# Patient Record
Sex: Female | Born: 1965 | Race: White | Hispanic: No | Marital: Married | Smoking: Never smoker
Health system: Southern US, Community
[De-identification: ages and names within clinical notes are randomized; demographics above are authoritative.]

## PROBLEM LIST (undated history)

## (undated) DIAGNOSIS — J45909 Unspecified asthma, uncomplicated: Secondary | ICD-10-CM

## (undated) DIAGNOSIS — I420 Dilated cardiomyopathy: Secondary | ICD-10-CM

## (undated) DIAGNOSIS — Z95810 Presence of automatic (implantable) cardiac defibrillator: Secondary | ICD-10-CM

## (undated) DIAGNOSIS — E213 Hyperparathyroidism, unspecified: Secondary | ICD-10-CM

## (undated) DIAGNOSIS — F419 Anxiety disorder, unspecified: Secondary | ICD-10-CM

## (undated) DIAGNOSIS — Z7982 Long term (current) use of aspirin: Secondary | ICD-10-CM

## (undated) DIAGNOSIS — Z950 Presence of cardiac pacemaker: Secondary | ICD-10-CM

## (undated) DIAGNOSIS — E559 Vitamin D deficiency, unspecified: Secondary | ICD-10-CM

## (undated) DIAGNOSIS — Z87442 Personal history of urinary calculi: Secondary | ICD-10-CM

## (undated) DIAGNOSIS — B3322 Viral myocarditis: Secondary | ICD-10-CM

## (undated) DIAGNOSIS — I509 Heart failure, unspecified: Secondary | ICD-10-CM

## (undated) DIAGNOSIS — K5790 Diverticulosis of intestine, part unspecified, without perforation or abscess without bleeding: Secondary | ICD-10-CM

## (undated) DIAGNOSIS — I447 Left bundle-branch block, unspecified: Secondary | ICD-10-CM

## (undated) DIAGNOSIS — M170 Bilateral primary osteoarthritis of knee: Secondary | ICD-10-CM

## (undated) DIAGNOSIS — Z9889 Other specified postprocedural states: Secondary | ICD-10-CM

## (undated) DIAGNOSIS — F32A Depression, unspecified: Secondary | ICD-10-CM

## (undated) DIAGNOSIS — R002 Palpitations: Secondary | ICD-10-CM

## (undated) DIAGNOSIS — I10 Essential (primary) hypertension: Secondary | ICD-10-CM

## (undated) DIAGNOSIS — I429 Cardiomyopathy, unspecified: Secondary | ICD-10-CM

## (undated) DIAGNOSIS — I209 Angina pectoris, unspecified: Secondary | ICD-10-CM

## (undated) DIAGNOSIS — R7303 Prediabetes: Secondary | ICD-10-CM

## (undated) DIAGNOSIS — R112 Nausea with vomiting, unspecified: Secondary | ICD-10-CM

## (undated) DIAGNOSIS — E785 Hyperlipidemia, unspecified: Secondary | ICD-10-CM

## (undated) DIAGNOSIS — E7800 Pure hypercholesterolemia, unspecified: Secondary | ICD-10-CM

## (undated) DIAGNOSIS — M1990 Unspecified osteoarthritis, unspecified site: Secondary | ICD-10-CM

## (undated) HISTORY — DX: Anxiety disorder, unspecified: F41.9

## (undated) HISTORY — PX: WISDOM TOOTH EXTRACTION: SHX21

## (undated) HISTORY — DX: Hyperlipidemia, unspecified: E78.5

## (undated) HISTORY — DX: Left bundle-branch block, unspecified: I44.7

---

## 2006-07-16 ENCOUNTER — Ambulatory Visit: Payer: Self-pay | Admitting: Urology

## 2006-07-21 ENCOUNTER — Ambulatory Visit: Payer: Self-pay | Admitting: Urology

## 2007-06-10 DIAGNOSIS — J209 Acute bronchitis, unspecified: Secondary | ICD-10-CM | POA: Insufficient documentation

## 2008-02-19 ENCOUNTER — Ambulatory Visit: Payer: Self-pay | Admitting: Family Medicine

## 2008-09-26 DIAGNOSIS — J0100 Acute maxillary sinusitis, unspecified: Secondary | ICD-10-CM | POA: Insufficient documentation

## 2010-03-18 DIAGNOSIS — I447 Left bundle-branch block, unspecified: Secondary | ICD-10-CM

## 2010-03-18 HISTORY — DX: Left bundle-branch block, unspecified: I44.7

## 2010-11-20 ENCOUNTER — Ambulatory Visit (INDEPENDENT_AMBULATORY_CARE_PROVIDER_SITE_OTHER): Payer: BC Managed Care – PPO | Admitting: Cardiology

## 2010-11-20 ENCOUNTER — Encounter: Payer: Self-pay | Admitting: Cardiology

## 2010-11-20 DIAGNOSIS — I447 Left bundle-branch block, unspecified: Secondary | ICD-10-CM

## 2010-11-20 DIAGNOSIS — E785 Hyperlipidemia, unspecified: Secondary | ICD-10-CM

## 2010-11-20 DIAGNOSIS — R079 Chest pain, unspecified: Secondary | ICD-10-CM | POA: Insufficient documentation

## 2010-11-20 NOTE — Assessment & Plan Note (Signed)
Given her history of hyperlipidemia and family history of early coronary disease she needs stress test evaluation with a Lexiscan Myoview study. We'll also schedule her for an echocardiogram. If her stress test is abnormal or equivocal she will need a cardiac catheterization

## 2010-11-20 NOTE — Progress Notes (Signed)
   Erin Stuart Date of Birth: 1965-06-24   History of Present Illness: Erin Stuart is a very pleasant 45 year old white female who is referred for evaluation of chest pain. She reports 2 episodes of significant chest pain over the past month. She describes this as mid substernal with a lot of pressure. She equates it with an elephant sitting on her chest. The first time she had this she was just getting dressed. Her pain lasted for 30 minutes and then abated. She's had one other episode since then. She does complain that she is more short of breath with activity even just walking across her yard. She does have a history of hyperlipidemia and family history of early coronary disease. She states that she is 50 pounds overweight compared to 10 years ago. Her only prior cardiac evaluation included an echocardiogram 5 or 6 years ago for a murmur. His is apparently unremarkable.  No current outpatient prescriptions on file prior to visit.    No Known Allergies  Past Medical History  Diagnosis Date  . Hyperlipidemia   . Anxiety     History reviewed. No pertinent past surgical history.  History  Smoking status  . Never Smoker   Smokeless tobacco  . Not on file    History  Alcohol Use No    History reviewed. No pertinent family history.  Review of Systems: The review of systems is positive for her father having factor V Leiden deficiency. She has been tested for this and was negative.  All other systems were reviewed and are negative.  Physical Exam: BP 158/102  Ht 5\' 4"  (1.626 m)  Wt 196 lb 3.2 oz (88.996 kg)  BMI 33.68 kg/m2 She is an overweight white female in no acute distress.The patient is alert and oriented x 3.  The mood and affect are normal.  The skin is warm and dry.  Color is normal.  The HEENT exam reveals that the sclera are nonicteric.  The mucous membranes are moist.  The carotids are 2+ without bruits.  There is no thyromegaly.  There is no JVD.  The lungs are clear.   The chest wall is non tender.  The heart exam reveals a regular rate with a normal S1 and S2.  There are no murmurs, gallops, or rubs.  The PMI is not displaced.   Abdominal exam reveals good bowel sounds. She is obese. There is no guarding or rebound.  There is no hepatosplenomegaly or tenderness.  There are no masses.  Exam of the legs reveal no clubbing, cyanosis, or edema.  The legs are without rashes.  The distal pulses are intact.  Cranial nerves II - XII are intact.  Motor and sensory functions are intact.  The gait is normal. LABORATORY DATA: ECG demonstrates normal sinus rhythm with a left axis deviation and left bundle branch block.  Assessment / Plan:

## 2010-11-20 NOTE — Assessment & Plan Note (Signed)
No remote EKGs for comparison. Cardiac evaluation as noted.

## 2010-11-20 NOTE — Patient Instructions (Signed)
You have a left bundle branch block.  We will schedule you for a nuclear stress test and an echocardiogram.  We will get a copy of your prior blood work.  Lexiscan Stress Test Your caregiver has ordered a Lexiscan Stress Test with nuclear imaging. The purpose of this test is to evaluate the blood supply to your heart muscle. This procedure is referred to as a "Non-Invasive Stress Test." This is because other than having an IV started in your vein, nothing is inserted or "invades" your body. Cardiac stress tests are done to find areas of poor blood flow to the heart by determining the extent of coronary artery disease (CAD). Some patients exercise on a treadmill, which naturally increases the blood flow to your heart. However, almost half of the patients undergoing a treadmill stress test each year are unable to exercise adequately. This is due to various medical conditions. For these patients, a pharmacologic/chemical stress agent called Eugenie Birks is used on patients without a history of asthma. This medicine will mimic walking on a treadmill by temporarily increasing your coronary blood flow. The side effects of this medicine include:  Headache.   Dizziness.   Shortness of breath.   Flushing.   Chest pain/pressure.   Feeling sick to your stomach (nauseous).   Increased heart rate.   Abdominal discomfort.   Low blood pressure.  BEFORE THE PROCEDURE  Avoid all forms of caffeine 24 hours before your test. This includes coffee, tea (even decaffeinated brands), caffeinated sodas, chocolate, cocoa and certain pain medications that contain caffeine.   Do not eat anything 6 hours before the test. In hospitalized patients, this means nothing by mouth after midnight.   Patients with diabetes that are not hospitalized (outpatients) should talk to their caregiver about how much, if any, insulin or oral diabetic medications they should take the day of the test. You should also talk about the type  of light snack that you should eat the morning of the test.   Patients with diabetes that are hospitalized (inpatients) will follow the cardiologist's written instructions that will be given to you.   Outpatients should bring a snack. Use the hospital vending machines so that you may eat right after the stress phase.   Wear comfortable shoes. There is at least an hour wait before the stress phase of nuclear scanning is done. The total procedure time is approximately 3 hours.   The following medications should be stopped 24 hours before your stress test: Beta-blockers and nitroglycerine (patches or paste should be removed 4 hours before the test).   Women, tell your caregiver if there is any possibility that you may be pregnant or if you are currently breastfeeding.  PROCEDURE There are two separate nuclear images taken using a nuclear camera. These images require a small dose of a radiotracer called an isotope. Most diagnostic nuclear medicine procedures result in low radiation exposure. Allergic reactions to the isotope may include slight redness going up the arm and pain during the injection. However, these reactions are extremely rare and usually mild. Eugenie Birks is given very rapidly over 10-15 seconds in the vein (intravenously) followed by a nuclear isotope. The medication causes the arteries in your heart to widen (dilate), and the nuclear isotope lights up those arteries so that the nuclear images are clear. Together, this shows whether the coronary blood flow is normal or abnormal. During this stress phase, you will be connected to an EKG machine. Your blood pressure and oxygen levels will be monitored  by the cardiologist and cardiac nurse. This part usually lasts 5-10 minutes. For inpatients, the procedure is done in the patient's room. For outpatients, the procedure is done in the stress lab.  The "Resting Phase" is done before the Lexiscan injection and shows how your heart functions at  rest. The "Stress Phase" is done about 1 hour after the Lexiscan injection and determines how your heart functions under stress.  LEXISCAN STRESS TEST IS USEFUL FOR DETERMINING:  The adequacy of blood flow to your heart during increased levels of activity in order to clear you for discharge home.   The extent of coronary artery blockage caused by CAD.   Your prognosis if you have suffered a heart attack.   The effectiveness of cardiac procedures done, such as an angioplasty, which can increase the circulation in your coronary arteries.   Cause(s) of chest pain/pressure.  RESULTS Not all test results are available during your visit. If your test results are not back during the visit, make an appointment with your caregiver to find out the results. Do not assume everything is normal if you have not heard from your caregiver or the medical facility. It is important for you to follow up on all of your test results. Document Released: 07/21/2008  [facility] Patient Information 2011 [place], [place].

## 2010-11-20 NOTE — Assessment & Plan Note (Signed)
We will obtain copies of her most recent lab work. Given her family history of early coronary disease we may need to be more aggressive about treating her cholesterol. If her cardiac evaluation is unremarkable we will recommend regular aerobic exercise program and weight loss.

## 2010-11-21 ENCOUNTER — Encounter: Payer: Self-pay | Admitting: Cardiology

## 2010-11-22 ENCOUNTER — Encounter: Payer: Self-pay | Admitting: *Deleted

## 2010-11-29 ENCOUNTER — Ambulatory Visit (HOSPITAL_COMMUNITY): Payer: BC Managed Care – PPO | Attending: Cardiology | Admitting: Radiology

## 2010-11-29 ENCOUNTER — Ambulatory Visit (HOSPITAL_COMMUNITY): Payer: BC Managed Care – PPO | Attending: Cardiology

## 2010-11-29 DIAGNOSIS — E785 Hyperlipidemia, unspecified: Secondary | ICD-10-CM | POA: Insufficient documentation

## 2010-11-29 DIAGNOSIS — I447 Left bundle-branch block, unspecified: Secondary | ICD-10-CM

## 2010-11-29 DIAGNOSIS — R0989 Other specified symptoms and signs involving the circulatory and respiratory systems: Secondary | ICD-10-CM | POA: Insufficient documentation

## 2010-11-29 DIAGNOSIS — R0789 Other chest pain: Secondary | ICD-10-CM

## 2010-11-29 DIAGNOSIS — R0609 Other forms of dyspnea: Secondary | ICD-10-CM | POA: Insufficient documentation

## 2010-11-29 DIAGNOSIS — I491 Atrial premature depolarization: Secondary | ICD-10-CM

## 2010-11-29 DIAGNOSIS — R079 Chest pain, unspecified: Secondary | ICD-10-CM | POA: Insufficient documentation

## 2010-11-29 DIAGNOSIS — E669 Obesity, unspecified: Secondary | ICD-10-CM | POA: Insufficient documentation

## 2010-11-29 DIAGNOSIS — R9389 Abnormal findings on diagnostic imaging of other specified body structures: Secondary | ICD-10-CM | POA: Insufficient documentation

## 2010-11-29 DIAGNOSIS — I4949 Other premature depolarization: Secondary | ICD-10-CM

## 2010-11-29 DIAGNOSIS — R072 Precordial pain: Secondary | ICD-10-CM

## 2010-11-29 MED ORDER — TECHNETIUM TC 99M TETROFOSMIN IV KIT
33.0000 | PACK | Freq: Once | INTRAVENOUS | Status: AC | PRN
  Administered 2010-11-29: 33 via INTRAVENOUS

## 2010-11-29 MED ORDER — TECHNETIUM TC 99M TETROFOSMIN IV KIT
11.0000 | PACK | Freq: Once | INTRAVENOUS | Status: AC | PRN
  Administered 2010-11-29: 11 via INTRAVENOUS

## 2010-11-29 MED ORDER — ADENOSINE (DIAGNOSTIC) 3 MG/ML IV SOLN
0.5600 mg/kg | Freq: Once | INTRAVENOUS | Status: AC
  Administered 2010-11-29: 50.1 mg via INTRAVENOUS

## 2010-11-29 NOTE — Progress Notes (Signed)
[facility] SITE 3 NUCLEAR MED [address] [place] [address] [phone]  Cardiology Nuclear Med Study  Erin Stuart is a 45 y.o. female [id number] 10/01/1965   Nuclear Med Background Indication for Stress Test:  Evaluation for Ischemia History:  No previous documented CAD and 5/6 yrs ago Echo: NL per pt Cardiac Risk Factors: Family History - CAD, LBBB and Lipids  Symptoms:  Chest Pain, Chest Pressure, DOE and Palpitations   Nuclear Pre-Procedure Caffeine/Decaff Intake:  None NPO After: 630 pm   Lungs:  clear IV 0.9% NS with Angio Cath:  20g  IV Site: R Hand  IV Started by:  Bonnita Levan, RN  Chest Size (in):  36 Cup Size: C  Height: 5\' 4"  (1.626 m)  Weight:  197 lb (89.359 kg)  BMI:  Body mass index is 33.82 kg/(m^2). Tech Comments:  N/A    Nuclear Med Study 1 or 2 day study: 1 day  Stress Test Type:  Adenosine  Reading MD: Cassell Clement, MD  Order Authorizing Provider:  P,.Jordan  Resting Radionuclide: Technetium 28m Tetrofosmin  Resting Radionuclide Dose: 11.0 mCi   Stress Radionuclide:  Technetium 54m Tetrofosmin  Stress Radionuclide Dose: 33.0 mCi           Stress Protocol Rest HR: 76 Stress HR: 100  Rest BP: 138/97 Stress BP: 158/88  Exercise Time (min): n/a METS: n/a   Predicted Max HR: 175 bpm % Max HR: 57.14 bpm Rate Pressure Product: 08657   Dose of Adenosine (mg):  50.1 Dose of Lexiscan: n/a mg  Dose of Atropine (mg): n/a Dose of Dobutamine: n/a mcg/kg/min (at max HR)  Stress Test Technologist: Milana Na, EMT-P  Nuclear Technologist:  Domenic Polite, CNMT     Rest Procedure:  Myocardial perfusion imaging was performed at rest 45 minutes following the intravenous administration of Technetium 81m Tetrofosmin. Rest ECG: NSR  Stress Procedure:  The patient received IV adenosine at 140 mcg/kg/min for 4 minutes.  There were no significant changes and rare pvc/pac with infusion.  Technetium 73m Tetrofosmin was injected  at the 2 minute mark and quantitative spect images were obtained after a 45 minute delay. Stress ECG: Uninteretable due to baseline LBBB  QPS Raw Data Images:  There is interference from nuclear activity from structures below the diaphragm. This does not affect the ability to read the study. Stress Images:  Normal homogeneous uptake in all areas of the myocardium. Rest Images:  Normal homogeneous uptake in all areas of the myocardium. Subtraction (SDS):  No evidence of ischemia. Transient Ischemic Dilatation (Normal <1.22):  0.88 Lung/Heart Ratio (Normal <0.45):  0.40  Quantitative Gated Spect Images QGS EDV:  107 ml QGS ESV:  47 ml QGS cine images:  NL LV Function; NL Wall Motion QGS EF: 56%  Impression Exercise Capacity:  Adenosine study with no exercise. BP Response:  Normal blood pressure response. Clinical Symptoms:  Chest tightness ECG Impression:  Baseline:  LBBB.  EKG uninterpretable due to LBBB at rest and stress. Comparison with Prior Nuclear Study: No previous nuclear study performed  Overall Impression:  Normal stress nuclear study.  No ischemia.  Normal LV systolic function.    Cassell Clement

## 2010-11-30 ENCOUNTER — Telehealth: Payer: Self-pay | Admitting: *Deleted

## 2010-11-30 NOTE — Telephone Encounter (Signed)
Notified of stress test and echo results. Will send to Dr. Katrinka Blazing. Advised to get prilosec otc. Will see in 1 mo

## 2010-11-30 NOTE — Telephone Encounter (Signed)
Message copied by Lorayne Bender on Fri Nov 30, 2010  3:16 PM ------      Message from: [place], PETER M      Created: Fri Nov 30, 2010 12:51 PM       Nuclear stress test is normal. We need to get prior lab work to review lipids. Recommend prilosec for chest pain. Needs to lose weight and begin a regular walking program. I would like to see her back in 1 month.      Theron Arista [place]

## 2010-12-04 ENCOUNTER — Telehealth: Payer: Self-pay | Admitting: Cardiology

## 2010-12-04 NOTE — Telephone Encounter (Signed)
Pt returning call to [place].  Please call her back at [phone] ext. [phone]

## 2010-12-04 NOTE — Telephone Encounter (Signed)
Had several questions about her test results. Advised to increase walking and to monitor diet more closely. Made her an app for 1 mo for follow up. Advised to try Prilosec to see if that helps with chest discomfort.

## 2010-12-05 ENCOUNTER — Telehealth: Payer: Self-pay | Admitting: *Deleted

## 2010-12-05 MED ORDER — ATORVASTATIN CALCIUM 10 MG PO TABS: 10.0000 mg | ORAL_TABLET | Freq: Every day | ORAL | Status: DC

## 2010-12-05 NOTE — Telephone Encounter (Signed)
Received labs from [facility] in [place]. Dr. [place] wants to start on Lipitor 10 mg daily. Advised of side effects. Will send Erin Stuart order for repeat labs in 3 mo. LP,hfp,bmet. Sent lab request to att: Herbert Seta at fax [phone].

## 2010-12-24 ENCOUNTER — Institutional Professional Consult (permissible substitution): Payer: Self-pay | Admitting: Cardiology

## 2011-01-02 ENCOUNTER — Ambulatory Visit: Payer: BC Managed Care – PPO | Admitting: Cardiology

## 2011-01-22 ENCOUNTER — Ambulatory Visit: Payer: BC Managed Care – PPO | Admitting: Cardiology

## 2014-08-22 ENCOUNTER — Telehealth: Payer: Self-pay | Admitting: Obstetrics and Gynecology

## 2014-08-22 NOTE — Telephone Encounter (Signed)
CALLED [facility] [place], ALL MEDICATION IS FILLED AND READY FOR PT TO PICK UP

## 2014-08-23 ENCOUNTER — Telehealth: Payer: Self-pay | Admitting: Obstetrics and Gynecology

## 2014-08-23 NOTE — Telephone Encounter (Signed)
PT WAS CALLED LAST NIGHT ABOUT HER RX'S.Erin Stuart W[facility]ED INTO [facility] BUT SHE USES [facility] IN [place]..THEY NEED TO BE CALLED IN THERE.

## 2014-08-23 NOTE — Telephone Encounter (Signed)
done

## 2014-09-27 ENCOUNTER — Telehealth: Payer: Self-pay | Admitting: *Deleted

## 2014-09-27 ENCOUNTER — Ambulatory Visit (INDEPENDENT_AMBULATORY_CARE_PROVIDER_SITE_OTHER): Payer: No Typology Code available for payment source | Admitting: Obstetrics and Gynecology

## 2014-09-27 ENCOUNTER — Other Ambulatory Visit: Payer: Self-pay | Admitting: Obstetrics and Gynecology

## 2014-09-27 VITALS — BP 148/82 | HR 81 | Ht 64.0 in | Wt 190.8 lb

## 2014-09-27 DIAGNOSIS — E669 Obesity, unspecified: Secondary | ICD-10-CM | POA: Diagnosis not present

## 2014-09-27 MED ORDER — CYANOCOBALAMIN 1000 MCG/ML IJ SOLN
1000.0000 ug | Freq: Once | INTRAMUSCULAR | Status: AC
  Administered 2014-09-27: 1000 ug via INTRAMUSCULAR

## 2014-09-27 MED ORDER — BUSPIRONE HCL 7.5 MG PO TABS: 7.5000 mg | ORAL_TABLET | Freq: Two times a day (BID) | ORAL | Status: DC

## 2014-09-27 NOTE — Telephone Encounter (Signed)
Pt would like refill on her buspar 7.5mg  bid, states her old Dr rx for her and she has transferred care to [place]

## 2014-09-27 NOTE — Progress Notes (Cosign Needed)
Pt is here for wt, bp check, b-12 inj Is tolerating medication well, has been on vacation, didn't do well with her diet

## 2014-09-27 NOTE — Telephone Encounter (Signed)
Notified pt. 

## 2014-09-27 NOTE — Telephone Encounter (Signed)
Please let her know it was done

## 2014-11-08 ENCOUNTER — Ambulatory Visit (INDEPENDENT_AMBULATORY_CARE_PROVIDER_SITE_OTHER): Payer: No Typology Code available for payment source | Admitting: Obstetrics and Gynecology

## 2014-11-08 VITALS — BP 124/84 | HR 80 | Ht 64.0 in | Wt 182.3 lb

## 2014-11-08 DIAGNOSIS — E663 Overweight: Secondary | ICD-10-CM

## 2014-11-08 MED ORDER — CYANOCOBALAMIN 1000 MCG/ML IJ SOLN
1000.0000 ug | Freq: Once | INTRAMUSCULAR | Status: AC
  Administered 2014-11-08: 1000 ug via INTRAMUSCULAR

## 2014-11-08 NOTE — Progress Notes (Cosign Needed)
Patient ID: Erin Stuart, female   DOB: 09-17-1965, 49 y.o.   MRN: [id number] Pt presents for weight check, B/P ck and B-12 injection.  Weight loss of 8 lbs since last visit.  Pt insisted she had a multidose vial of B-12 here and when I looked and asked the CMA who keeps it, stated if it is not there she does not have any.  I got one from stock and pt okayed for me to give her one.  Pt states "is that B-12?, I never seen red B-12 before" and I said that B-12 is red in color, at least all that I had ever seen was.  Appt to be scheduled for 1 month f/u with [facility]NB for weight management.  Pharmacy was called by CMA and pt got only a 57ml vial, this was after the pt left.  Pt became anxious and shaking her head and states "this is a very unorganized visit" and I asked her what made her say that. I realize she usually sees another nurse but other than the B-12 medication issue, what was different?  Pt states it is just different.  At check out I heard pt telling staff that we ought to have someone who knew what they were doing. Unsure of what exactly was meant by this and no voiced complaint from pt was the B-12 medication.

## 2014-12-06 ENCOUNTER — Ambulatory Visit (INDEPENDENT_AMBULATORY_CARE_PROVIDER_SITE_OTHER): Payer: No Typology Code available for payment source | Admitting: Obstetrics and Gynecology

## 2014-12-06 ENCOUNTER — Encounter: Payer: Self-pay | Admitting: Obstetrics and Gynecology

## 2014-12-06 VITALS — BP 140/86 | HR 98 | Ht 64.0 in | Wt 178.4 lb

## 2014-12-06 DIAGNOSIS — Z8041 Family history of malignant neoplasm of ovary: Secondary | ICD-10-CM | POA: Diagnosis not present

## 2014-12-06 DIAGNOSIS — E669 Obesity, unspecified: Secondary | ICD-10-CM | POA: Diagnosis not present

## 2014-12-06 MED ORDER — PHENTERMINE HCL 37.5 MG PO TABS: 37.5000 mg | ORAL_TABLET | Freq: Every day | ORAL | Status: DC

## 2014-12-06 MED ORDER — CYANOCOBALAMIN 1000 MCG/ML IJ SOLN: 1000.0000 ug | Freq: Once | INTRAMUSCULAR | Status: DC

## 2014-12-06 NOTE — Progress Notes (Signed)
Patient ID: Erin Stuart, female   DOB: 11-21-1965, 49 y.o.   MRN: [id number] SUBJECTIVE:  49 y.o. here for follow-up weight loss visit, previously seen 4 weeks ago. Denies any concerns and feels like medication is working well. Just finished 3rd month. Has lost 20#  OBJECTIVE:  BP 140/86 mmHg  Pulse 98  Ht 5\' 4"  (1.626 m)  Wt 178 lb 6.4 oz (80.922 kg)  BMI 30.61 kg/m2  Body mass index is 30.61 kg/(m^2). Patient appears well. ASSESSMENT:  Obesity- responding well to weight loss plan PLAN:  To continue with current medications. B12 1030mcg/ml injection given RTC in 4 weeks as planned  Gennie Alma, CNM

## 2015-01-02 ENCOUNTER — Ambulatory Visit (INDEPENDENT_AMBULATORY_CARE_PROVIDER_SITE_OTHER): Payer: No Typology Code available for payment source | Admitting: Obstetrics and Gynecology

## 2015-01-02 VITALS — BP 135/89 | HR 73 | Wt 181.5 lb

## 2015-01-02 DIAGNOSIS — E669 Obesity, unspecified: Secondary | ICD-10-CM | POA: Diagnosis not present

## 2015-01-02 MED ORDER — CYANOCOBALAMIN 1000 MCG/ML IJ SOLN
1000.0000 ug | Freq: Once | INTRAMUSCULAR | Status: AC
  Administered 2015-01-02: 1000 ug via INTRAMUSCULAR

## 2015-01-02 NOTE — Progress Notes (Cosign Needed)
Pt is here for wt, bp check, b-12 inj, states she hasnt really been trying as hard Will do better this month  Last visit 12/06/14 wt-182

## 2015-03-02 ENCOUNTER — Encounter: Payer: Self-pay | Admitting: Obstetrics and Gynecology

## 2015-03-02 ENCOUNTER — Ambulatory Visit (INDEPENDENT_AMBULATORY_CARE_PROVIDER_SITE_OTHER): Payer: Managed Care, Other (non HMO) | Admitting: Obstetrics and Gynecology

## 2015-03-02 VITALS — BP 124/77 | HR 70 | Ht 64.0 in | Wt 174.1 lb

## 2015-03-02 DIAGNOSIS — F419 Anxiety disorder, unspecified: Secondary | ICD-10-CM | POA: Diagnosis not present

## 2015-03-02 MED ORDER — TRIAZOLAM 0.125 MG PO TABS: 0.1250 mg | ORAL_TABLET | Freq: Every evening | ORAL | Status: DC | PRN

## 2015-03-02 MED ORDER — ALPRAZOLAM 1 MG PO TABS: 1.0000 mg | ORAL_TABLET | Freq: Three times a day (TID) | ORAL | Status: DC | PRN

## 2015-03-02 NOTE — Progress Notes (Signed)
Subjective:     Patient ID: Erin Stuart, female   DOB: July 06, 1965, 49 y.o.   MRN: [id number]  HPI Anxiety returning  Review of Systems Increased heart rate when stressed, has awakened 2 x this week at arounf 3 am with chest pain, no nausea. Feels like it is r/t work stressors as it has worsened over the last 5 months with new Freight [employer]. Denies any other symptoms. Desires restart xanax and soemthing else to help at night.    Objective:   Physical Exam A&O x4  well groomed female in no distress HRR Thoughts and association intact    Assessment:     anxiety     Plan:     Restart xanax 32m 1/2 to 1 tablet tid prn. Added triazolam 0.1260mhqs prn. RTC in 3 weeks for recheck.  Sayda Grable ShAronaCNM

## 2015-03-24 ENCOUNTER — Ambulatory Visit: Payer: No Typology Code available for payment source | Admitting: Obstetrics and Gynecology

## 2015-04-17 ENCOUNTER — Telehealth: Payer: Self-pay | Admitting: Obstetrics and Gynecology

## 2015-04-17 NOTE — Telephone Encounter (Signed)
Erin Stuart just wants you to call her please  [phone]

## 2015-04-18 NOTE — Telephone Encounter (Signed)
Medication was [facility] to [facility] pts preference

## 2015-05-08 ENCOUNTER — Telehealth: Payer: Self-pay | Admitting: Obstetrics and Gynecology

## 2015-05-08 NOTE — Telephone Encounter (Signed)
PT CALLED AFTER YOU HAD LEFT AND LEFT MESSAGE ON OUR OFFICE VM REQUESTING THAT YOU CALL HER BACK WHEN YOU HAD A CHANCE, SHE DID NOT LEAVE A REASON WHY

## 2015-05-09 NOTE — Telephone Encounter (Signed)
[facility]ed [facility] [place] rx was there

## 2015-05-22 ENCOUNTER — Ambulatory Visit: Payer: No Typology Code available for payment source | Admitting: Physician Assistant

## 2015-06-06 ENCOUNTER — Encounter: Payer: Self-pay | Admitting: Obstetrics and Gynecology

## 2015-06-06 ENCOUNTER — Ambulatory Visit (INDEPENDENT_AMBULATORY_CARE_PROVIDER_SITE_OTHER): Payer: Managed Care, Other (non HMO) | Admitting: Obstetrics and Gynecology

## 2015-06-06 VITALS — BP 134/84 | HR 78 | Ht 64.0 in | Wt 182.6 lb

## 2015-06-06 DIAGNOSIS — Z01419 Encounter for gynecological examination (general) (routine) without abnormal findings: Secondary | ICD-10-CM

## 2015-06-06 MED ORDER — CEFDINIR 300 MG PO CAPS
300.0000 mg | ORAL_CAPSULE | Freq: Two times a day (BID) | ORAL | Status: DC
Start: 2015-06-06 — End: 2016-08-22

## 2015-06-06 MED ORDER — BENZONATATE 100 MG PO CAPS: 200.0000 mg | ORAL_CAPSULE | Freq: Two times a day (BID) | ORAL | Status: DC | PRN

## 2015-06-06 NOTE — Progress Notes (Signed)
Subjective:   Erin Stuart is a 50 y.o. No obstetric history on file. Caucasian female here for a routine well-woman exam.  Patient's last menstrual period was 05/12/2015.    Current complaints: cough and URI x 5 weeks PCP: Rosanna Randy       does desire labs  Social History: Sexual: heterosexual Marital Status: married Living situation: with spouse Occupation: works in Marketing executive at [employer]: no tobacco use Illicit drugs: no history of illicit drug use  The following portions of the patient's history were reviewed and updated as appropriate: allergies, current medications, past family history, past medical history, past social history, past surgical history and problem list.  Past Medical History Past Medical History  Diagnosis Date  . Hyperlipidemia   . Anxiety   . LBBB (left bundle branch block)     Past Surgical History History reviewed. No pertinent past surgical history.  Gynecologic History No obstetric history on file.  Patient's last menstrual period was 05/12/2015. Contraception: vasectomy Last Pap: 2014. Results were: normal Last mammogram: 2016. Results were: normal   Obstetric History OB History  No data available    Current Medications Current Outpatient Prescriptions on File Prior to Visit  Medication Sig Dispense Refill  . ALPRAZolam (XANAX) 1 MG tablet Take 1 tablet (1 mg total) by mouth 3 (three) times daily as needed for anxiety. 60 tablet 1  . aspirin 81 MG tablet Take 81 mg by mouth daily. Reported on 03/02/2015    . triazolam (HALCION) 0.125 MG tablet Take 1 tablet (0.125 mg total) by mouth at bedtime as needed for sleep. 30 tablet 2  . atorvastatin (LIPITOR) 10 MG tablet Take 1 tablet (10 mg total) by mouth daily. 30 tablet 5  . busPIRone (BUSPAR) 7.5 MG tablet Take 1 tablet (7.5 mg total) by mouth 2 (two) times daily. (Patient not taking: Reported on 03/02/2015) 60 tablet 6  . cyanocobalamin (,VITAMIN B-12,) 1000 MCG/ML injection  Inject 1 mL (1,000 mcg total) into the muscle once. (Patient not taking: Reported on 03/02/2015) 10 mL 1  . phentermine (ADIPEX-P) 37.5 MG tablet Take 1 tablet (37.5 mg total) by mouth daily. (Patient not taking: Reported on 03/02/2015) 30 tablet 2   No current facility-administered medications on file prior to visit.    Review of Systems Patient denies any headaches, blurred vision, shortness of breath, chest pain, abdominal pain, problems with bowel movements, urination, or intercourse.  Objective:  BP 134/84 mmHg  Pulse 78  Ht 5\' 4"  (1.626 m)  Wt 182 lb 9.6 oz (82.827 kg)  BMI 31.33 kg/m2  LMP 05/12/2015 Physical Exam  General:  Well developed, well nourished, no acute distress. She is alert and oriented x3. Skin:  Warm and dry, 2 raised moles- extremely dark in color on right breast Neck:  Midline trachea, no thyromegaly or nodules Cardiovascular: Regular rate and rhythm, no murmur heard Lungs:  Effort normal, all lung fields clear to auscultation bilaterally Breasts:  No dominant palpable mass, retraction, or nipple discharge Abdomen:  Soft, non tender, no hepatosplenomegaly or masses Pelvic:  External genitalia is normal in appearance.  The vagina is normal in appearance. The cervix is bulbous, no CMT.  Thin prep pap is not done . Uterus is felt to be normal size, shape, and contour.  No adnexal masses or tenderness noted. Extremities:  No swelling or varicosities noted Psych:  She has a normal mood and affect  Assessment:   Healthy well-woman exam Obesity Suspicious nevi URI  Plan:  Labs obtained RX for tessalon pearls and Omnicef 300mg  bid sent in Will return in 2-3 weeks to restart weight loss program and remove moles F/U 1 year  for AE, or sooner if needed Mammogram scheduled  Melody Rockney Ghee, CNM

## 2015-06-06 NOTE — Patient Instructions (Signed)
  Place annual gynecologic exam patient instructions here.  Thank you for enrolling in [place]. Please follow the instructions below to securely access your online medical record. MyChart allows you to send messages to your doctor, view your test results, manage appointments, and more.   How Do I Sign Up? 1. In your Internet browser, go to [employer] and enter [email or web address]. 2. Click on the Sign Up Now link in the Sign In box. You will see the New Member Sign Up page. 3. Enter your MyChart Access Code exactly as it appears below. You will not need to use this code after you've completed the sign-up process. If you do not sign up before the expiration date, you must request a new code.  MyChart Access Code: [email or web address] Expires: 07/08/2015  4:11 PM  4. Enter your Social Security Number ([id number]) and Date of Birth (mm/dd/yyyy) as indicated and click Submit. You will be taken to the next sign-up page. 5. Create a MyChart ID. This will be your MyChart login ID and cannot be changed, so think of one that is secure and easy to remember. 6. Create a MyChart password. You can change your password at any time. 7. Enter your Password Reset Question and Answer. This can be used at a later time if you forget your password.  8. Enter your e-mail address. You will receive e-mail notification when new information is available in [place]. 9. Click Sign Up. You can now view your medical record.   Additional Information Remember, MyChart is NOT to be used for urgent needs. For medical emergencies, dial 911.

## 2015-06-07 LAB — COMPREHENSIVE METABOLIC PANEL
A/G RATIO: 1.6 (ref 1.2–2.2)
ALT: 11 IU/L (ref 0–32)
AST: 17 IU/L (ref 0–40)
Albumin: 4.1 g/dL (ref 3.5–5.5)
Alkaline Phosphatase: 58 IU/L (ref 39–117)
BUN/Creatinine Ratio: 22 (ref 9–23)
BUN: 15 mg/dL (ref 6–24)
Bilirubin Total: 0.2 mg/dL (ref 0.0–1.2)
CALCIUM: 10 mg/dL (ref 8.7–10.2)
CO2: 25 mmol/L (ref 18–29)
Chloride: 102 mmol/L (ref 96–106)
Creatinine, Ser: 0.67 mg/dL (ref 0.57–1.00)
GFR, EST AFRICAN AMERICAN: 119 mL/min/{1.73_m2} (ref >59)
GFR, EST NON AFRICAN AMERICAN: 103 mL/min/{1.73_m2} (ref >59)
GLOBULIN, TOTAL: 2.6 g/dL (ref 1.5–4.5)
Glucose: 92 mg/dL (ref 65–99)
POTASSIUM: 4.4 mmol/L (ref 3.5–5.2)
Sodium: 142 mmol/L (ref 134–144)
TOTAL PROTEIN: 6.7 g/dL (ref 6.0–8.5)

## 2015-06-07 LAB — HEMOGLOBIN A1C
Est. average glucose Bld gHb Est-mCnc: 114 mg/dL
Hgb A1c MFr Bld: 5.6 % (ref 4.8–5.6)

## 2015-06-07 LAB — LIPID PANEL
CHOLESTEROL TOTAL: 220 mg/dL — AB (ref 100–199)
Chol/HDL Ratio: 5.1 ratio units — ABNORMAL HIGH (ref 0.0–4.4)
HDL: 43 mg/dL (ref >39)
LDL Calculated: 145 mg/dL — ABNORMAL HIGH (ref 0–99)
TRIGLYCERIDES: 160 mg/dL — AB (ref 0–149)
VLDL Cholesterol Cal: 32 mg/dL (ref 5–40)

## 2015-06-07 LAB — VITAMIN D 25 HYDROXY (VIT D DEFICIENCY, FRACTURES): VIT D 25 HYDROXY: 22.2 ng/mL — AB (ref 30.0–100.0)

## 2015-06-15 ENCOUNTER — Other Ambulatory Visit: Payer: Self-pay | Admitting: Obstetrics and Gynecology

## 2015-06-15 DIAGNOSIS — E559 Vitamin D deficiency, unspecified: Secondary | ICD-10-CM | POA: Insufficient documentation

## 2015-06-15 MED ORDER — VITAMIN D (ERGOCALCIFEROL) 1.25 MG (50000 UNIT) PO CAPS: 50000.0000 [IU] | ORAL_CAPSULE | ORAL | Status: DC

## 2015-06-15 MED ORDER — ATORVASTATIN CALCIUM 10 MG PO TABS: 10.0000 mg | ORAL_TABLET | Freq: Every day | ORAL | Status: DC

## 2015-06-19 ENCOUNTER — Telehealth: Payer: Self-pay | Admitting: *Deleted

## 2015-06-19 NOTE — Telephone Encounter (Signed)
-----   Message from [place], [place] sent at 06/15/2015 12:58 PM EDT ----- Please let her know cholesterol is back up- I sent in refills on lipitor and want her to restart it, and continue to work on low cholesterol diet and weight loss. Vit d is low too- I sent in rx for weekly supplement.

## 2015-06-19 NOTE — Telephone Encounter (Signed)
Mailed all labs to pt and info on Vit D

## 2015-06-20 ENCOUNTER — Ambulatory Visit: Payer: Self-pay | Attending: Obstetrics and Gynecology

## 2015-06-21 ENCOUNTER — Ambulatory Visit
Admission: RE | Admit: 2015-06-21 | Discharge: 2015-06-21 | Disposition: A | Discharge status: Home / Self Care | Payer: Managed Care, Other (non HMO) | Source: Ambulatory Visit | Attending: Obstetrics and Gynecology | Admitting: Obstetrics and Gynecology

## 2015-06-21 DIAGNOSIS — Z01419 Encounter for gynecological examination (general) (routine) without abnormal findings: Secondary | ICD-10-CM

## 2015-06-21 DIAGNOSIS — Z1231 Encounter for screening mammogram for malignant neoplasm of breast: Secondary | ICD-10-CM | POA: Insufficient documentation

## 2015-07-03 ENCOUNTER — Telehealth: Payer: Self-pay | Admitting: Obstetrics and Gynecology

## 2015-07-03 NOTE — Telephone Encounter (Signed)
Patients needs her b3 resent to the pharmacy. She waited too long to pick it up so it will need to be resent.Thanks

## 2015-07-04 ENCOUNTER — Other Ambulatory Visit: Payer: Self-pay | Admitting: *Deleted

## 2015-07-04 MED ORDER — CYANOCOBALAMIN 1000 MCG/ML IJ SOLN: 1000.0000 ug | INTRAMUSCULAR | Status: DC

## 2015-07-04 NOTE — Telephone Encounter (Signed)
Done-ac 

## 2015-07-06 ENCOUNTER — Telehealth: Payer: Self-pay | Admitting: Obstetrics and Gynecology

## 2015-07-06 ENCOUNTER — Other Ambulatory Visit: Payer: Self-pay | Admitting: *Deleted

## 2015-07-06 ENCOUNTER — Other Ambulatory Visit: Payer: Self-pay | Admitting: Obstetrics and Gynecology

## 2015-07-06 MED ORDER — PHENTERMINE HCL 37.5 MG PO TABS: 37.5000 mg | ORAL_TABLET | Freq: Every day | ORAL | Status: DC

## 2015-07-06 MED ORDER — VITAMIN D (ERGOCALCIFEROL) 1.25 MG (50000 UNIT) PO CAPS: 50000.0000 [IU] | ORAL_CAPSULE | ORAL | Status: DC

## 2015-07-06 NOTE — Telephone Encounter (Signed)
She needs the D-[facility]. Hold off on lipitor, she's going to mangt by diet and if not successful she will do the lipitor., she also needs the phentermine. She talked to Melody at her AE. Call her if you have questions

## 2015-07-06 NOTE — Telephone Encounter (Signed)
Called pt she voiced understanding 

## 2015-07-06 NOTE — Telephone Encounter (Signed)
Mel does she have to come in for appt

## 2015-07-06 NOTE — Telephone Encounter (Signed)
Sent in Vit d refill, and printed adipex, but she still needs a nurse visit to restart it and get RX.

## 2015-07-07 ENCOUNTER — Ambulatory Visit: Payer: Managed Care, Other (non HMO) | Admitting: Obstetrics and Gynecology

## 2015-12-14 ENCOUNTER — Ambulatory Visit: Payer: Managed Care, Other (non HMO) | Admitting: Obstetrics and Gynecology

## 2015-12-27 ENCOUNTER — Ambulatory Visit (INDEPENDENT_AMBULATORY_CARE_PROVIDER_SITE_OTHER): Payer: BLUE CROSS/BLUE SHIELD | Admitting: Obstetrics and Gynecology

## 2015-12-27 ENCOUNTER — Encounter: Payer: Self-pay | Admitting: Obstetrics and Gynecology

## 2015-12-27 ENCOUNTER — Other Ambulatory Visit (INDEPENDENT_AMBULATORY_CARE_PROVIDER_SITE_OTHER): Payer: BLUE CROSS/BLUE SHIELD

## 2015-12-27 VITALS — BP 138/84 | HR 78 | Wt 188.3 lb

## 2015-12-27 DIAGNOSIS — N938 Other specified abnormal uterine and vaginal bleeding: Secondary | ICD-10-CM | POA: Diagnosis not present

## 2015-12-27 MED ORDER — TRANEXAMIC ACID 650 MG PO TABS: 1300.0000 mg | ORAL_TABLET | Freq: Three times a day (TID) | ORAL | 2 refills | Status: DC

## 2015-12-27 NOTE — Progress Notes (Signed)
Subjective:     Patient ID: Erin Stuart, female   DOB: 08-01-1965, 50 y.o.   MRN: [id number]  HPI Normal menses in May and June, none in July, then spotting in August and September. Then started this month 2 days ago with normal flow until this am and had heavy gush and large clots. Almost fainted at work when standing, lasted a few minutes and resolved with O2,  Drank apple juice and crackers 45 minutes before episode. Moderate cramping noted. History of known uterine fibroids. Feels really tired now.  Review of Systems Negative except stated in HPI    Objective:   Physical Exam A&O x4 Well groomed female. Pale  Blood pressure 138/84, pulse 78, weight 188 lb 4.8 oz (85.4 kg), last menstrual period 12/27/2015. Pelvic u/s today reveals: Findings:  The uterus measures 10.1 x 5.1 x 6.2 cm. Echo texture is diffusely heterogenous with evidence of focal masses. Within the uterus are multiple suspected fibroids measuring: Fibroid 1: Anterior, LUS, Subserosal 2.5 x 2.7 x 2.6 cm. Fibroid 2: Posterior, mid uterus, submucosal, 2.7 x 2.6 x 2.8 cm. Fibroid 3: Fundal, subserosal, 2.6 x 3.0 x 3.3 cm.  The Endometrium measures 5.6 mm.  Right Ovary measures 2.6 x 1.7 x 2.1cm, and appears WNL. Left ovary is not visualized. Survey of the adnexa demonstrates no adnexal masses. There is no free fluid in the cul de sac.  Impression: 1. Enlarged uterus with multiple fibroids. One appears to be submucosal and measures 2.7 x 2.6 x 2.8 cm.     Assessment:     DUB Uterine fibroids Fainting spell    Plan:     Labs obtained to r/o anemia, RX sent in for lysteda and instructed on use.  Offered OCPs to resolve bleeding and discussed surgical options. Desires hysterectomy and will see Dr Enzo Bi to discuss.  > 50 % of 25 minute visit spent in counseling Erin Stuart [place], [place]

## 2015-12-28 ENCOUNTER — Other Ambulatory Visit: Payer: Self-pay | Admitting: Obstetrics and Gynecology

## 2015-12-28 LAB — COMPREHENSIVE METABOLIC PANEL
ALBUMIN: 3.9 g/dL (ref 3.5–5.5)
ALK PHOS: 60 IU/L (ref 39–117)
ALT: 12 IU/L (ref 0–32)
AST: 15 IU/L (ref 0–40)
Albumin/Globulin Ratio: 1.5 (ref 1.2–2.2)
BUN / CREAT RATIO: 21 (ref 9–23)
BUN: 13 mg/dL (ref 6–24)
Bilirubin Total: 0.3 mg/dL (ref 0.0–1.2)
CO2: 27 mmol/L (ref 18–29)
CREATININE: 0.62 mg/dL (ref 0.57–1.00)
Calcium: 9.7 mg/dL (ref 8.7–10.2)
Chloride: 100 mmol/L (ref 96–106)
GFR, EST AFRICAN AMERICAN: 122 mL/min/{1.73_m2} (ref >59)
GFR, EST NON AFRICAN AMERICAN: 105 mL/min/{1.73_m2} (ref >59)
GLOBULIN, TOTAL: 2.6 g/dL (ref 1.5–4.5)
GLUCOSE: 90 mg/dL (ref 65–99)
Potassium: 3.8 mmol/L (ref 3.5–5.2)
SODIUM: 140 mmol/L (ref 134–144)
TOTAL PROTEIN: 6.5 g/dL (ref 6.0–8.5)

## 2015-12-28 LAB — CBC WITH DIFFERENTIAL/PLATELET
BASOS ABS: 0 10*3/uL (ref 0.0–0.2)
BASOS: 0 %
EOS (ABSOLUTE): 0.1 10*3/uL (ref 0.0–0.4)
Eos: 2 %
HEMOGLOBIN: 12.6 g/dL (ref 11.1–15.9)
Hematocrit: 38.1 % (ref 34.0–46.6)
IMMATURE GRANS (ABS): 0 10*3/uL (ref 0.0–0.1)
Immature Granulocytes: 0 %
LYMPHS ABS: 1.7 10*3/uL (ref 0.7–3.1)
Lymphs: 27 %
MCH: 26.2 pg — AB (ref 26.6–33.0)
MCHC: 33.1 g/dL (ref 31.5–35.7)
MCV: 79 fL (ref 79–97)
Monocytes Absolute: 0.4 10*3/uL (ref 0.1–0.9)
Monocytes: 6 %
NEUTROS ABS: 4 10*3/uL (ref 1.4–7.0)
Neutrophils: 65 %
Platelets: 245 10*3/uL (ref 150–379)
RBC: 4.81 x10E6/uL (ref 3.77–5.28)
RDW: 15.5 % — ABNORMAL HIGH (ref 12.3–15.4)
WBC: 6.2 10*3/uL (ref 3.4–10.8)

## 2015-12-28 LAB — B12 AND FOLATE PANEL
Folate: >20 ng/mL (ref >3.0)
VITAMIN B 12: 358 pg/mL (ref 211–946)

## 2015-12-28 LAB — VITAMIN D 25 HYDROXY (VIT D DEFICIENCY, FRACTURES): Vit D, 25-Hydroxy: 18.6 ng/mL — ABNORMAL LOW (ref 30.0–100.0)

## 2015-12-28 LAB — THYROID PANEL WITH TSH
FREE THYROXINE INDEX: 1.8 (ref 1.2–4.9)
T3 UPTAKE RATIO: 28 % (ref 24–39)
T4, Total: 6.6 ug/dL (ref 4.5–12.0)
TSH: 0.92 u[IU]/mL (ref 0.450–4.500)

## 2015-12-28 LAB — IRON: Iron: 61 ug/dL (ref 27–159)

## 2015-12-28 MED ORDER — VITAMIN D (ERGOCALCIFEROL) 1.25 MG (50000 UNIT) PO CAPS: 50000.0000 [IU] | ORAL_CAPSULE | ORAL | 2 refills | Status: DC

## 2015-12-29 ENCOUNTER — Ambulatory Visit: Payer: Managed Care, Other (non HMO) | Admitting: Obstetrics and Gynecology

## 2016-01-01 ENCOUNTER — Telehealth: Payer: Self-pay | Admitting: *Deleted

## 2016-01-01 NOTE — Telephone Encounter (Signed)
-----   Message from Joylene Igo, [place] sent at 12/28/2015  1:34 PM EDT ----- Please let her know lab results, all looks normal except Vit D is lower. I am sending in a RX for TWICE weekly vit D

## 2016-01-01 NOTE — Telephone Encounter (Signed)
Mailed pt all of her lab results

## 2016-01-05 ENCOUNTER — Other Ambulatory Visit: Payer: Self-pay | Admitting: *Deleted

## 2016-01-05 MED ORDER — ALPRAZOLAM 1 MG PO TABS: 1.0000 mg | ORAL_TABLET | Freq: Three times a day (TID) | ORAL | 2 refills | Status: DC | PRN

## 2016-01-25 ENCOUNTER — Institutional Professional Consult (permissible substitution): Payer: BLUE CROSS/BLUE SHIELD | Admitting: Obstetrics and Gynecology

## 2016-06-06 ENCOUNTER — Encounter: Payer: Managed Care, Other (non HMO) | Admitting: Obstetrics and Gynecology

## 2016-08-01 ENCOUNTER — Encounter: Payer: BLUE CROSS/BLUE SHIELD | Admitting: Obstetrics and Gynecology

## 2016-08-13 DIAGNOSIS — I272 Pulmonary hypertension, unspecified: Secondary | ICD-10-CM

## 2016-08-13 HISTORY — DX: Pulmonary hypertension, unspecified: I27.20

## 2016-08-19 DIAGNOSIS — I420 Dilated cardiomyopathy: Secondary | ICD-10-CM | POA: Insufficient documentation

## 2016-08-20 ENCOUNTER — Telehealth: Payer: Self-pay | Admitting: Obstetrics and Gynecology

## 2016-08-20 NOTE — Telephone Encounter (Signed)
Notified pt. 

## 2016-08-20 NOTE — Telephone Encounter (Signed)
Patient called and wants to speak with [name] about her prescription for ALPRAZolam [name]) 1 MG tablet, The patient only uses the medication as needed and the prescription expired before she could refill it. "Patient would like a call back and refill. Please advise.

## 2016-08-21 ENCOUNTER — Other Ambulatory Visit: Payer: Self-pay | Admitting: Cardiology

## 2016-08-27 ENCOUNTER — Encounter
Admission: RE | Disposition: A | Discharge status: Home / Self Care | Payer: Self-pay | Source: Ambulatory Visit | Attending: Cardiology

## 2016-08-27 ENCOUNTER — Ambulatory Visit
Admission: RE | Admit: 2016-08-27 | Discharge: 2016-08-27 | Disposition: A | Discharge status: Home / Self Care | Payer: BLUE CROSS/BLUE SHIELD | Source: Ambulatory Visit | Attending: Cardiology | Admitting: Cardiology

## 2016-08-27 DIAGNOSIS — E8770 Fluid overload, unspecified: Secondary | ICD-10-CM | POA: Insufficient documentation

## 2016-08-27 DIAGNOSIS — I340 Nonrheumatic mitral (valve) insufficiency: Secondary | ICD-10-CM | POA: Insufficient documentation

## 2016-08-27 DIAGNOSIS — Z7982 Long term (current) use of aspirin: Secondary | ICD-10-CM | POA: Insufficient documentation

## 2016-08-27 DIAGNOSIS — Z7951 Long term (current) use of inhaled steroids: Secondary | ICD-10-CM | POA: Diagnosis not present

## 2016-08-27 DIAGNOSIS — I428 Other cardiomyopathies: Secondary | ICD-10-CM | POA: Diagnosis not present

## 2016-08-27 DIAGNOSIS — F419 Anxiety disorder, unspecified: Secondary | ICD-10-CM | POA: Diagnosis not present

## 2016-08-27 DIAGNOSIS — E7801 Familial hypercholesterolemia: Secondary | ICD-10-CM | POA: Insufficient documentation

## 2016-08-27 DIAGNOSIS — R05 Cough: Secondary | ICD-10-CM | POA: Diagnosis not present

## 2016-08-27 DIAGNOSIS — E559 Vitamin D deficiency, unspecified: Secondary | ICD-10-CM | POA: Insufficient documentation

## 2016-08-27 DIAGNOSIS — I447 Left bundle-branch block, unspecified: Secondary | ICD-10-CM | POA: Diagnosis not present

## 2016-08-27 HISTORY — PX: RIGHT/LEFT HEART CATH AND CORONARY ANGIOGRAPHY: CATH118266

## 2016-08-27 SURGERY — RIGHT/LEFT HEART CATH AND CORONARY ANGIOGRAPHY
Anesthesia: Moderate Sedation

## 2016-08-27 SURGERY — RIGHT/LEFT HEART CATH AND CORONARY ANGIOGRAPHY
Anesthesia: Moderate Sedation | Laterality: Bilateral

## 2016-08-27 SURGERY — RIGHT AND LEFT HEART CATH
Anesthesia: Moderate Sedation

## 2016-08-27 MED ORDER — MIDAZOLAM HCL 2 MG/2ML IJ SOLN
1.0000 mg | Freq: Once | INTRAMUSCULAR | Status: AC
  Administered 2016-08-27: 1 mg via INTRAVENOUS

## 2016-08-27 MED ORDER — FENTANYL CITRATE (PF) 100 MCG/2ML IJ SOLN
INTRAMUSCULAR | Status: DC | PRN
Start: 2016-08-27 — End: 2016-08-27
  Administered 2016-08-27: 50 ug via INTRAVENOUS

## 2016-08-27 MED ORDER — FENTANYL CITRATE (PF) 100 MCG/2ML IJ SOLN
INTRAMUSCULAR | Status: AC
  Filled 2016-08-27: qty 2

## 2016-08-27 MED ORDER — MIDAZOLAM HCL 2 MG/2ML IJ SOLN: 1.0000 mg | Freq: Once | INTRAMUSCULAR | Status: DC

## 2016-08-27 MED ORDER — SODIUM CHLORIDE 0.9% FLUSH: 3.0000 mL | INTRAVENOUS | Status: DC | PRN

## 2016-08-27 MED ORDER — SODIUM CHLORIDE 0.9 % WEIGHT BASED INFUSION: 1.0000 mL/kg/h | INTRAVENOUS | Status: DC

## 2016-08-27 MED ORDER — MIDAZOLAM HCL 2 MG/2ML IJ SOLN
INTRAMUSCULAR | Status: AC
  Administered 2016-08-27: 1 mg via INTRAVENOUS
  Filled 2016-08-27: qty 2

## 2016-08-27 MED ORDER — ASPIRIN 81 MG PO CHEW
81.0000 mg | CHEWABLE_TABLET | ORAL | Status: AC
  Administered 2016-08-27: 81 mg via ORAL

## 2016-08-27 MED ORDER — ASPIRIN 81 MG PO CHEW
CHEWABLE_TABLET | ORAL | Status: AC
  Filled 2016-08-27: qty 1

## 2016-08-27 MED ORDER — HEPARIN (PORCINE) IN NACL 2-0.9 UNIT/ML-% IJ SOLN
INTRAMUSCULAR | Status: AC
  Filled 2016-08-27: qty 500

## 2016-08-27 MED ORDER — SODIUM CHLORIDE 0.9 % IV SOLN: 250.0000 mL | INTRAVENOUS | Status: DC | PRN

## 2016-08-27 MED ORDER — SODIUM CHLORIDE 0.9 % IV SOLN
250.0000 mL | INTRAVENOUS | Status: DC | PRN
Start: 2016-08-27 — End: 2016-08-29

## 2016-08-27 MED ORDER — SODIUM CHLORIDE 0.9% FLUSH: 3.0000 mL | Freq: Two times a day (BID) | INTRAVENOUS | Status: DC

## 2016-08-27 MED ORDER — SODIUM CHLORIDE 0.9 % WEIGHT BASED INFUSION
3.0000 mL/kg/h | INTRAVENOUS | Status: AC
  Administered 2016-08-27: 3 mL/kg/h via INTRAVENOUS

## 2016-08-27 MED ORDER — MIDAZOLAM HCL 2 MG/2ML IJ SOLN
INTRAMUSCULAR | Status: DC | PRN
  Administered 2016-08-27: 1 mg via INTRAVENOUS

## 2016-08-27 MED ORDER — MIDAZOLAM HCL 2 MG/2ML IJ SOLN
INTRAMUSCULAR | Status: AC
  Filled 2016-08-27: qty 2

## 2016-08-27 SURGICAL SUPPLY — 13 items
CATH INFINITI 5FR ANG PIGTAIL (CATHETERS) ×2 IMPLANT
CATH INFINITI 5FR JL4 (CATHETERS) ×2 IMPLANT
CATH INFINITI JR4 5F (CATHETERS) ×2 IMPLANT
CATH SWANZ 7F THERMO (CATHETERS) ×2 IMPLANT
DEVICE CLOSURE MYNXGRIP 5F (Vascular Products) ×2 IMPLANT
GUIDEWIRE EMER 3M J .025X150CM (WIRE) ×2 IMPLANT
KIT MANI 3VAL PERCEP (MISCELLANEOUS) ×2 IMPLANT
KIT RIGHT HEART (MISCELLANEOUS) ×2 IMPLANT
NEEDLE PERC 18GX7CM (NEEDLE) ×2 IMPLANT
PACK CARDIAC CATH (CUSTOM PROCEDURE TRAY) ×2 IMPLANT
SHEATH AVANTI 5FR X 11CM (SHEATH) ×2 IMPLANT
SHEATH PINNACLE 7F 10CM (SHEATH) ×2 IMPLANT
WIRE EMERALD 3MM-J .035X150CM (WIRE) ×2 IMPLANT

## 2016-08-27 NOTE — H&P (Signed)
Chief Complaint: Chief Complaint  Patient presents with  . Follow-up  echo and stress  Date of Service: 08/19/2016 Date of Birth: 1965/09/19 PCP: Gearldine Shown, DO  History of Present Illness: Ms. Bo is a 51 y.o.female patient who presents for reevaluation. Was seen by cardiology approximately 4 years ago. She now feels dyspnea on exertion. She was diagnosed with a left bundle branch block in the past. Functional study done at that time revealed a normal left ventricular function. There is no ischemia on noninvasive evaluation. She was placed on Lasix and still has some exertional fatigue and shortness of breath. Repeat functional study and echo revealed markedly reduced LV function EF 20%. This appears global. There is no obvious reversibility. Etiology of her cardiomyopathy is unclear. Idiopathic versus ischemic versus viral. Patient does not drink etoh.  Past Medical and Surgical History  Past Medical History Past Medical History:  Diagnosis Date  . Anxiety, unspecified  . Kidney stones  . Left bundle branch block 2012  . Vitamin D deficiency, unspecified   Past Surgical History She has no past surgical history on file.   Medications and Allergies  Current Medications  Current Outpatient Prescriptions  Medication Sig Dispense Refill  . ALPRAZolam (XANAX) 1 MG tablet 0  . aspirin 81 MG EC tablet Take 81 mg by mouth once daily.  . beclomethasone (QVAR) 40 mcg/actuation inhaler Inhale 2 inhalations into the lungs 2 (two) times daily. 1 Inhaler 2  . benzonatate (TESSALON) 100 MG capsule Take 1 capsule (100 mg total) by mouth 3 (three) times daily as needed for Cough. 30 capsule 0  . fluticasone (FLONASE) 50 mcg/actuation nasal spray Place 2 sprays into both nostrils once daily.  . FUROsemide (LASIX) 20 MG tablet Take 1 tablet (20 mg total) by mouth once daily. 30 tablet 1  . guaiFENesin (TUSSIN) 100 mg/5 mL solution Take 100 mg by mouth every 4 (four) hours as needed  for Cough.  . montelukast (SINGULAIR) 10 mg tablet Take 1 tablet (10 mg total) by mouth nightly. 30 tablet 2  . carvedilol (COREG) 3.125 MG tablet Take 1 tablet (3.125 mg total) by mouth 2 (two) times daily with meals. 60 tablet 11   No current facility-administered medications for this visit.   Allergies: Patient has no known allergies.  Social and Family History  Social History reports that she has never smoked. She has never used smokeless tobacco. She reports that she does not drink alcohol or use drugs.  Family History Family History  Problem Relation Age of Onset  . Coronary Artery Disease (Blocked arteries around heart) Mother 74  MI  . High blood pressure (Hypertension) Mother  . Hyperlipidemia (Elevated cholesterol) Mother  . Factor V Leiden deficiency Father  . Skin cancer Father  melanoma  . High blood pressure (Hypertension) Father  . Diabetes type II Sister  . No Known Problems Daughter  . No Known Problems Son  . Ovarian cancer Paternal Aunt  . Diabetes type II Maternal Grandmother  . Coronary Artery Disease (Blocked arteries around heart) Maternal Grandfather 39  MI x2  . No Known Problems Daughter   Review of Systems  Review of Systems  Constitutional: Negative. Negative for chills, diaphoresis, fever, malaise/fatigue and weight loss.  HENT: Negative. Negative for congestion, ear discharge, hearing loss and tinnitus.  Eyes: Negative. Negative for blurred vision.  Respiratory: Positive for cough and shortness of breath. Negative for hemoptysis, sputum production and wheezing.  Cardiovascular: Positive for chest pain. Negative for  palpitations, orthopnea, claudication, leg swelling and PND.  Gastrointestinal: Negative. Negative for abdominal pain, blood in stool, constipation, diarrhea, heartburn, melena, nausea and vomiting.  Genitourinary: Negative. Negative for dysuria, frequency, hematuria and urgency.  Musculoskeletal: Negative. Negative for back pain,  falls, joint pain and myalgias.  Skin: Negative. Negative for itching and rash.  Neurological: Negative. Negative for dizziness, tingling, focal weakness, loss of consciousness, weakness and headaches.  Endo/Heme/Allergies: Negative. Negative for polydipsia. Does not bruise/bleed easily.  Psychiatric/Behavioral: Negative. Negative for depression, memory loss and substance abuse. The patient is not nervous/anxious.   Physical Examination   Vitals:BP (!) 150/108  Pulse 72  Resp 12  Ht 162.6 cm (5\' 4" )  Wt 83.9 kg (185 lb)  BMI 31.76 kg/m  Ht:162.6 cm (5\' 4" ) Wt:83.9 kg (185 lb) TGG:YIRS surface area is 1.95 meters squared. Body mass index is 31.76 kg/m.  Wt Readings from Last 3 Encounters:  08/19/16 83.9 kg (185 lb)  08/06/16 84.9 kg (187 lb 3.2 oz)  07/26/16 85.4 kg (188 lb 3.2 oz)   BP Readings from Last 3 Encounters:  08/19/16 (!) 150/108  08/06/16 110/80  07/26/16 138/88   General appearance appears in no acute distress  Head Mouth and Eye exam Normocephalic, without obvious abnormality, atraumatic Dentition is good Eyes appear anicteric   Neck exam Thyroid: normal  Nodes: no obvious adenopathy  LUNGS Breath Sounds: Normal Percussion: Normal  CARDIOVASCULAR JVP CV wave: no HJR: no Elevation at 90 degrees: None Carotid Pulse: normal pulsation bilaterally Bruit: None Apex: apical impulse normal  Auscultation Rhythm: normal sinus rhythm S1: normal S2: normal Clicks: no Rub: no Murmurs: no murmurs  Gallop: None ABDOMEN Liver enlargement: no Pulsatile aorta: no Ascites: no Bruits: no  EXTREMITIES Clubbing: no Edema: trace to 1+ bilateral pedal edema Pulses: peripheral pulses symmetrical Femoral Bruits: no Amputation: no SKIN Rash: no Cyanosis: no Embolic phemonenon: no Bruising: no NEURO Alert and Oriented to person, place and time: yes Non focal: yes  PSYCH: Pt appears to have normal affect  LABS REVIEWED Last 3 CBC results: Lab  Results  Component Value Date  WBC 5.3 08/19/2016  WBC 14.2 (H) 05/19/2016   Lab Results  Component Value Date  HGB 14.8 08/19/2016  HGB 13.6 05/19/2016   Lab Results  Component Value Date  HCT 42.3 08/19/2016  HCT 39.8 05/19/2016   Lab Results  Component Value Date  PLT 201 08/19/2016  PLT 160 05/19/2016   Diagnostic Studies Reviewed:  EKG EKG demonstrated normal sinus rhythm, LBBB.  Assessment and Plan   51 y.o. female with  ICD-10-CM ICD-9-CM  1. Cough-etiology unclear. Not on an ACE inhibitor. chest x-ray revealed pulmonary edema. Was placed on furosemide. We will continue with this. Cough is likely secondary to volume overload. R05 786.2  T46.4X5A E942.6  2. SOB (shortness of breath)-echo showed reduced LV function EF approximately 20%. We will proceed with left and right heart cath to better evaluate coronary anatomy as a possible etiology as well as to assess her volume status. R06.02 786.05   3. Dry cough-as per above. Improved somewhat with diuresis. R05 786.2  4. Atypical chest pain-patient with left bundle branch block and chest discomfort. Functional study showed reduced LV function with no obvious reversible ischemia. We will proceed with a cardiac catheterization to evaluate coronary anatomy to guide further therapy. R07.89 786.59   5. LBBB (left bundle branch block)-chronic I44.7 426.3  6. Other forms of angina pectoris (CMS-HCC) I20.8 413.9  7. Familial hypercholesterolemia-continue with low-fat diet.  E78.01 272.0   Return in about 4 weeks (around 09/16/2016).  These notes generated with voice recognition software. I apologize for typographical errors.     [facility]  Pt seen and examined. No change from above.

## 2016-09-16 DIAGNOSIS — I447 Left bundle-branch block, unspecified: Secondary | ICD-10-CM | POA: Diagnosis not present

## 2016-09-16 DIAGNOSIS — E7801 Familial hypercholesterolemia: Secondary | ICD-10-CM | POA: Diagnosis not present

## 2016-09-16 DIAGNOSIS — I420 Dilated cardiomyopathy: Secondary | ICD-10-CM | POA: Diagnosis not present

## 2016-10-18 DIAGNOSIS — I5022 Chronic systolic (congestive) heart failure: Secondary | ICD-10-CM | POA: Diagnosis not present

## 2016-10-18 DIAGNOSIS — I420 Dilated cardiomyopathy: Secondary | ICD-10-CM | POA: Diagnosis not present

## 2016-10-18 DIAGNOSIS — I447 Left bundle-branch block, unspecified: Secondary | ICD-10-CM | POA: Diagnosis not present

## 2016-11-14 ENCOUNTER — Encounter: Payer: BLUE CROSS/BLUE SHIELD | Admitting: Obstetrics and Gynecology

## 2016-11-26 DIAGNOSIS — I420 Dilated cardiomyopathy: Secondary | ICD-10-CM | POA: Diagnosis not present

## 2016-11-26 DIAGNOSIS — I5022 Chronic systolic (congestive) heart failure: Secondary | ICD-10-CM | POA: Diagnosis not present

## 2016-11-26 DIAGNOSIS — I447 Left bundle-branch block, unspecified: Secondary | ICD-10-CM | POA: Diagnosis not present

## 2016-11-27 DIAGNOSIS — Z79899 Other long term (current) drug therapy: Secondary | ICD-10-CM | POA: Diagnosis not present

## 2016-11-27 DIAGNOSIS — I5022 Chronic systolic (congestive) heart failure: Secondary | ICD-10-CM | POA: Diagnosis not present

## 2016-11-27 DIAGNOSIS — I447 Left bundle-branch block, unspecified: Secondary | ICD-10-CM | POA: Diagnosis not present

## 2016-11-27 DIAGNOSIS — Z95810 Presence of automatic (implantable) cardiac defibrillator: Secondary | ICD-10-CM

## 2016-11-27 DIAGNOSIS — E668 Other obesity: Secondary | ICD-10-CM | POA: Diagnosis not present

## 2016-11-27 DIAGNOSIS — I420 Dilated cardiomyopathy: Secondary | ICD-10-CM | POA: Diagnosis not present

## 2016-11-27 HISTORY — PX: BI-VENTRICULAR IMPLANTABLE CARDIOVERTER DEFIBRILLATOR  (CRT-D): SHX5747

## 2016-11-27 HISTORY — DX: Presence of automatic (implantable) cardiac defibrillator: Z95.810

## 2016-11-28 DIAGNOSIS — I420 Dilated cardiomyopathy: Secondary | ICD-10-CM | POA: Diagnosis not present

## 2016-11-28 DIAGNOSIS — I517 Cardiomegaly: Secondary | ICD-10-CM | POA: Diagnosis not present

## 2016-11-28 DIAGNOSIS — Z95810 Presence of automatic (implantable) cardiac defibrillator: Secondary | ICD-10-CM | POA: Diagnosis not present

## 2016-11-28 DIAGNOSIS — I447 Left bundle-branch block, unspecified: Secondary | ICD-10-CM | POA: Diagnosis not present

## 2016-11-28 DIAGNOSIS — I5022 Chronic systolic (congestive) heart failure: Secondary | ICD-10-CM | POA: Diagnosis not present

## 2016-11-28 DIAGNOSIS — I428 Other cardiomyopathies: Secondary | ICD-10-CM | POA: Diagnosis not present

## 2016-11-28 DIAGNOSIS — Z79899 Other long term (current) drug therapy: Secondary | ICD-10-CM | POA: Diagnosis not present

## 2016-12-11 DIAGNOSIS — I5022 Chronic systolic (congestive) heart failure: Secondary | ICD-10-CM | POA: Diagnosis not present

## 2016-12-11 DIAGNOSIS — E7801 Familial hypercholesterolemia: Secondary | ICD-10-CM | POA: Diagnosis not present

## 2016-12-11 DIAGNOSIS — I447 Left bundle-branch block, unspecified: Secondary | ICD-10-CM | POA: Diagnosis not present

## 2016-12-11 DIAGNOSIS — I420 Dilated cardiomyopathy: Secondary | ICD-10-CM | POA: Diagnosis not present

## 2016-12-26 DIAGNOSIS — E7801 Familial hypercholesterolemia: Secondary | ICD-10-CM | POA: Diagnosis not present

## 2016-12-26 DIAGNOSIS — I5022 Chronic systolic (congestive) heart failure: Secondary | ICD-10-CM | POA: Diagnosis not present

## 2016-12-26 DIAGNOSIS — I447 Left bundle-branch block, unspecified: Secondary | ICD-10-CM | POA: Diagnosis not present

## 2016-12-26 DIAGNOSIS — I420 Dilated cardiomyopathy: Secondary | ICD-10-CM | POA: Diagnosis not present

## 2017-01-09 DIAGNOSIS — I420 Dilated cardiomyopathy: Secondary | ICD-10-CM | POA: Diagnosis not present

## 2017-01-17 DIAGNOSIS — I5022 Chronic systolic (congestive) heart failure: Secondary | ICD-10-CM | POA: Diagnosis not present

## 2017-01-17 DIAGNOSIS — I447 Left bundle-branch block, unspecified: Secondary | ICD-10-CM | POA: Diagnosis not present

## 2017-01-17 DIAGNOSIS — I420 Dilated cardiomyopathy: Secondary | ICD-10-CM | POA: Diagnosis not present

## 2017-02-10 ENCOUNTER — Telehealth: Payer: Self-pay | Admitting: Obstetrics and Gynecology

## 2017-02-10 NOTE — Telephone Encounter (Signed)
Patient called and stated that she needs an Annual Exam and she stated that [name] said she could get an appointment within 2 weeks, I was not able to find anything in two weeks for the patient so she stated that she would rather have [name] call her to schedule the appointment to her liking. Please advise.

## 2017-02-11 DIAGNOSIS — E7801 Familial hypercholesterolemia: Secondary | ICD-10-CM | POA: Diagnosis not present

## 2017-02-17 NOTE — Telephone Encounter (Signed)
Done-ac 

## 2017-02-18 ENCOUNTER — Other Ambulatory Visit: Payer: Self-pay | Admitting: Obstetrics and Gynecology

## 2017-02-18 ENCOUNTER — Ambulatory Visit (INDEPENDENT_AMBULATORY_CARE_PROVIDER_SITE_OTHER): Payer: 59 | Admitting: Obstetrics and Gynecology

## 2017-02-18 ENCOUNTER — Encounter: Payer: Self-pay | Admitting: Obstetrics and Gynecology

## 2017-02-18 VITALS — BP 128/90 | HR 98 | Ht 64.0 in | Wt 195.2 lb

## 2017-02-18 DIAGNOSIS — Z23 Encounter for immunization: Secondary | ICD-10-CM | POA: Diagnosis not present

## 2017-02-18 DIAGNOSIS — Z01419 Encounter for gynecological examination (general) (routine) without abnormal findings: Secondary | ICD-10-CM | POA: Diagnosis not present

## 2017-02-18 DIAGNOSIS — L309 Dermatitis, unspecified: Secondary | ICD-10-CM | POA: Diagnosis not present

## 2017-02-18 MED ORDER — ALPRAZOLAM 1 MG PO TABS: 1.0000 mg | ORAL_TABLET | Freq: Three times a day (TID) | ORAL | 2 refills | Status: AC | PRN

## 2017-02-18 MED ORDER — LORATADINE 10 MG PO TBDP: 10.0000 mg | ORAL_TABLET | Freq: Every day | ORAL | 4 refills | Status: DC

## 2017-02-18 MED ORDER — CLOBETASOL PROPIONATE 0.05 % EX OINT: 1.0000 "application " | TOPICAL_OINTMENT | Freq: Two times a day (BID) | CUTANEOUS | 2 refills | Status: DC

## 2017-02-18 NOTE — Patient Instructions (Addendum)
Place annual gynecologic exam patient instructions here.  Thank you for enrolling in [facility]. Please follow the instructions below to securely access your online medical record. MyChart allows you to send messages to your doctor, view your test results, manage appointments, and more.   How Do I Sign Up? 1. In your Internet browser, go to [employer] and enter [email or web address]. 2. Click on the Sign Up Now link in the Sign In box. You will see the New Member Sign Up page. 3. Enter your MyChart Access Code exactly as it appears below. You will not need to use this code after you've completed the sign-up process. If you do not sign up before the expiration date, you must request a new code.  MyChart Access Code: [email or web address] Expires: 04/04/2017 11:50 AM  4. Enter your Social Security Number ([id number]) and Date of Birth (mm/dd/yyyy) as indicated and click Submit. You will be taken to the next sign-up page. 5. Create a MyChart ID. This will be your MyChart login ID and cannot be changed, so think of one that is secure and easy to remember. 6. Create a MyChart password. You can change your password at any time. 7. Enter your Password Reset Question and Answer. This can be used at a later time if you forget your password.  8. Enter your e-mail address. You will receive e-mail notification when new information is available in [facility]. 9. Click Sign Up. You can now view your medical record.   Additional Information Remember, MyChart is NOT to be used for urgent needs. For medical emergencies, dial 911.    Fat and Cholesterol Restricted Diet Getting too much fat and cholesterol in your diet may cause health problems. Following this diet helps keep your fat and cholesterol at normal levels. This can keep you from getting sick. What types of fat should I choose?  Choose monosaturated and polyunsaturated fats. These are found in foods such as olive oil, canola oil, flaxseeds,  walnuts, almonds, and seeds.  Eat more omega-3 fats. Good choices include salmon, mackerel, sardines, tuna, flaxseed oil, and ground flaxseeds.  Limit saturated fats. These are in animal products such as meats, butter, and cream. They can also be in plant products such as palm oil, palm kernel oil, and coconut oil.  Avoid foods with partially hydrogenated oils in them. These contain trans fats. Examples of foods that have trans fats are stick margarine, some tub margarines, cookies, crackers, and other baked goods. What general guidelines do I need to follow?  Check food labels. Look for the words "trans fat" and "saturated fat."  When preparing a meal: ? Fill half of your plate with vegetables and green salads. ? Fill one fourth of your plate with whole grains. Look for the word "whole" as the first word in the ingredient list. ? Fill one fourth of your plate with lean protein foods.  Eat more foods that have fiber, like apples, carrots, beans, peas, and barley.  Eat more home-cooked foods. Eat less at restaurants and buffets.  Limit or avoid alcohol.  Limit foods high in starch and sugar.  Limit fried foods.  Cook foods without frying them. Baking, boiling, grilling, and broiling are all great options.  Lose weight if you are overweight. Losing even a small amount of weight can help your overall health. It can also help prevent diseases such as diabetes and heart disease. What foods can I eat? Grains Whole grains, such as whole wheat or whole grain breads, crackers, cereals, and  pasta. Unsweetened oatmeal, bulgur, barley, quinoa, or brown rice. Corn or whole wheat flour tortillas. Vegetables Fresh or frozen vegetables (raw, steamed, roasted, or grilled). Green salads. Fruits All fresh, canned (in natural juice), or frozen fruits. Meat and Other Protein Products Ground beef (85% or leaner), grass-fed beef, or beef trimmed of fat. Skinless chicken or [place]. Ground chicken or  [place]. Pork trimmed of fat. All fish and seafood. Eggs. Dried beans, peas, or lentils. Unsalted nuts or seeds. Unsalted canned or dry beans. Dairy Low-fat dairy products, such as skim or 1% milk, 2% or reduced-fat cheeses, low-fat ricotta or cottage cheese, or plain low-fat yogurt. Fats and Oils Tub margarines without trans fats. Light or reduced-fat mayonnaise and salad dressings. Avocado. Olive, canola, sesame, or safflower oils. Natural peanut or almond butter (choose ones without added sugar and oil). The items listed above may not be a complete list of recommended foods or beverages. Contact your dietitian for more options. What foods are not recommended? Grains White bread. White pasta. White rice. Cornbread. Bagels, pastries, and croissants. Crackers that contain trans fat. Vegetables White potatoes. Corn. Creamed or fried vegetables. Vegetables in a cheese sauce. Fruits Dried fruits. Canned fruit in light or heavy syrup. Fruit juice. Meat and Other Protein Products Fatty cuts of meat. Ribs, chicken wings, bacon, sausage, bologna, salami, chitterlings, fatback, hot dogs, bratwurst, and packaged luncheon meats. Liver and organ meats. Dairy Whole or 2% milk, cream, half-and-half, and cream cheese. Whole milk cheeses. Whole-fat or sweetened yogurt. Full-fat cheeses. Nondairy creamers and whipped toppings. Processed cheese, cheese spreads, or cheese curds. Sweets and Desserts Corn syrup, sugars, honey, and molasses. Candy. Jam and jelly. Syrup. Sweetened cereals. Cookies, pies, cakes, donuts, muffins, and ice cream. Fats and Oils Butter, stick margarine, lard, shortening, ghee, or bacon fat. Coconut, palm kernel, or palm oils. Beverages Alcohol. Sweetened drinks (such as sodas, lemonade, and fruit drinks or punches). The items listed above may not be a complete list of foods and beverages to avoid. Contact your dietitian for more information. This information is not intended to replace  advice given to you by your health care provider. Make sure you discuss any questions you have with your health care provider. Document Released: 09/03/2011 Document Revised: 11/09/2015 Document Reviewed: 06/03/2013 Elsevier Interactive Patient Education  [employer].

## 2017-02-18 NOTE — Progress Notes (Signed)
Subjective:   Erin Stuart is a 51 y.o. G15P0 Caucasian female here for a routine well-woman exam.  No LMP recorded. Patient is perimenopausal.    Current complaints: light menses in Feb & July & then started today. Also perianal eczema is flared up. And new large skin tag noted on right labia PCP: Dartha Lodge       does desire labs  Social History: Sexual: heterosexual Marital Status: married Living situation: with spouse Occupation: unknown occupation Tobacco/alcohol: no tobacco use Illicit drugs: no history of illicit drug use  The following portions of the patient's history were reviewed and updated as appropriate: allergies, current medications, past family history, past medical history, past social history, past surgical history and problem list.  Past Medical History Past Medical History:  Diagnosis Date  . Anxiety   . Hyperlipidemia   . LBBB (left bundle branch block)     Past Surgical History Past Surgical History:  Procedure Laterality Date  . RIGHT/LEFT HEART CATH AND CORONARY ANGIOGRAPHY Bilateral 08/27/2016   Procedure: Right/Left Heart Cath and Coronary Angiography;  Surgeon: Teodoro Spray, MD;  Location: [facility];  Service: Cardiovascular;  Laterality: Bilateral;    Gynecologic History G3P0  No LMP recorded. Patient is perimenopausal. Contraception: post menopausal status Last Pap:  Results were: normal Last mammogram: 06/2015. Results were: normal   Obstetric History OB History  Gravida Para Term Preterm AB Living  3            SAB TAB Ectopic Multiple Live Births               # Outcome Date GA Lbr Len/2nd Weight Sex Delivery Anes PTL Lv  3 Gravida 1995    M Vag-Spont     2 Gravida 1992    F Vag-Spont     1 Gravida 1989    F Vag-Spont         Current Medications Current Outpatient Medications on File Prior to Visit  Medication Sig Dispense Refill  . aspirin 81 MG tablet Take 81 mg by mouth every evening. Reported on 03/02/2015     . beclomethasone (QVAR) 40 MCG/ACT inhaler Inhale 2 puffs into the lungs 2 (two) times daily.    . carvedilol (COREG) 3.125 MG tablet Take 3.125 mg by mouth 2 (two) times daily with a meal.    . cetirizine (ZYRTEC) 10 MG tablet Take 10 mg by mouth daily.    . fluticasone (FLONASE) 50 MCG/ACT nasal spray Place 2 sprays into both nostrils daily as needed for allergies or rhinitis.    . furosemide (LASIX) 20 MG tablet Take 20 mg by mouth daily.    Marland Kitchen lisinopril (PRINIVIL,ZESTRIL) 20 MG tablet Take 20 mg by mouth daily.    . Multiple Vitamins-Minerals (ALIVE WOMENS 50+) TABS Take 1 tablet by mouth daily.    Marland Kitchen ALPRAZolam (XANAX) 1 MG tablet Take 1 tablet (1 mg total) by mouth 3 (three) times daily as needed for anxiety. (Patient not taking: Reported on 02/18/2017) 60 tablet 2  . ibuprofen (ADVIL,MOTRIN) 200 MG tablet Take 800 mg by mouth daily as needed for moderate pain.    . montelukast (SINGULAIR) 10 MG tablet Take 10 mg by mouth at bedtime.    . Vitamin D, Ergocalciferol, (DRISDOL) 50000 units CAPS capsule Take 1 capsule (50,000 Units total) by mouth 2 (two) times a week. (Patient not taking: Reported on 02/18/2017) 30 capsule 2   No current facility-administered medications on file prior to visit.  Review of Systems Patient denies any headaches, blurred vision, shortness of breath, chest pain, abdominal pain, problems with bowel movements, urination, or intercourse.  Objective:  BP 128/90   Pulse 98   Ht 5\' 4"  (1.626 m)   Wt 195 lb 3.2 oz (88.5 kg)   BMI 33.51 kg/m  Physical Exam  General:  Well developed, well nourished, no acute distress. She is alert and oriented x3. Skin:  Warm and dry Neck:  Midline trachea, no thyromegaly or nodules Cardiovascular: Regular rate and rhythm, no murmur heard Lungs:  Effort normal, all lung fields clear to auscultation bilaterally Breasts:  No dominant palpable mass, retraction, or nipple discharge Abdomen:  Soft, non tender, no hepatosplenomegaly  or masses Pelvic:  External genitalia is covered in eczema to anal region. Large pedunculated skin tag noted on right labia.  The vagina is normal in appearance. The cervix is bulbous, no CMT.  Thin prep pap is done with HR HPV cotesting. Uterus is felt to be normal size, shape, and contour.  No adnexal masses or tenderness noted. Extremities:  No swelling or varicosities noted Psych:  She has a normal mood and affect  Assessment:   Healthy well-woman exam Eczema of labia Right labial skin tag Sleep disturbances secondary to anxiety Needs flu vaccine Needs screening colonoscopy  Plan:  Labs obtained will follow up accordingly F/U 1 year for AE, or sooner if needed Mammogram ordered`    referred to GI for screening colonoscopy To stop zyrtec due to hypertensive effects, and start claritin 10 mg daily. Added clobex ointment bid x 2 weeks then nightly RTC in 4-6 weeks for labial skin tag removal.     Demitrious Mccannon Rockney Ghee, CNM

## 2017-02-19 LAB — VITAMIN D 25 HYDROXY (VIT D DEFICIENCY, FRACTURES): Vit D, 25-Hydroxy: 17.4 ng/mL — ABNORMAL LOW (ref 30.0–100.0)

## 2017-02-19 LAB — COMPREHENSIVE METABOLIC PANEL
A/G RATIO: 1.7 (ref 1.2–2.2)
ALBUMIN: 4.3 g/dL (ref 3.5–5.5)
ALK PHOS: 61 IU/L (ref 39–117)
ALT: 15 IU/L (ref 0–32)
AST: 21 IU/L (ref 0–40)
BILIRUBIN TOTAL: 0.3 mg/dL (ref 0.0–1.2)
BUN / CREAT RATIO: 18 (ref 9–23)
BUN: 13 mg/dL (ref 6–24)
CHLORIDE: 99 mmol/L (ref 96–106)
CO2: 26 mmol/L (ref 20–29)
Calcium: 10.4 mg/dL — ABNORMAL HIGH (ref 8.7–10.2)
Creatinine, Ser: 0.72 mg/dL (ref 0.57–1.00)
GFR calc Af Amer: 111 mL/min/{1.73_m2} (ref >59)
GFR calc non Af Amer: 97 mL/min/{1.73_m2} (ref >59)
GLOBULIN, TOTAL: 2.6 g/dL (ref 1.5–4.5)
Glucose: 93 mg/dL (ref 65–99)
POTASSIUM: 4.3 mmol/L (ref 3.5–5.2)
SODIUM: 142 mmol/L (ref 134–144)
Total Protein: 6.9 g/dL (ref 6.0–8.5)

## 2017-02-20 LAB — CYTOLOGY - PAP

## 2017-02-25 NOTE — Telephone Encounter (Signed)
-----   Message from Joylene Igo, [place] sent at 02/25/2017  1:05 PM EST ----- Please let her know labs and that it is very important to take her Vit D 2 times a week. I want to recheck labs in 3 months, please go ahead and schedule.

## 2017-02-25 NOTE — Telephone Encounter (Signed)
Mailed info to pt 

## 2017-03-03 ENCOUNTER — Telehealth: Payer: Self-pay | Admitting: Obstetrics and Gynecology

## 2017-03-03 NOTE — Telephone Encounter (Signed)
Sent pt message, also mailed copy of labs

## 2017-03-03 NOTE — Telephone Encounter (Signed)
The patient called and stated that she having trouble accessing her labs on MyChart, The patient would like to speak [name] in regards to her most recent labs when [name] is available. No other information was disclosed other than wanting to receive a call back. Please advise.

## 2017-03-24 ENCOUNTER — Other Ambulatory Visit: Payer: Self-pay | Admitting: *Deleted

## 2017-03-24 MED ORDER — VITAMIN D (ERGOCALCIFEROL) 1.25 MG (50000 UNIT) PO CAPS: 50000.0000 [IU] | ORAL_CAPSULE | ORAL | 2 refills | Status: DC

## 2017-03-25 DIAGNOSIS — I420 Dilated cardiomyopathy: Secondary | ICD-10-CM | POA: Diagnosis not present

## 2017-03-26 ENCOUNTER — Telehealth: Payer: Self-pay | Admitting: Obstetrics and Gynecology

## 2017-03-26 NOTE — Telephone Encounter (Signed)
Patient called stating she has had a cold and wants to know what cold medicine would be safe for her to take with her blood pressure issues. Thanks

## 2017-03-27 NOTE — Telephone Encounter (Signed)
Notified pt to ask pharmacy about medication that is safe to take

## 2017-03-28 ENCOUNTER — Telehealth: Payer: Self-pay | Admitting: Gastroenterology

## 2017-03-28 ENCOUNTER — Other Ambulatory Visit: Payer: Self-pay

## 2017-03-28 DIAGNOSIS — Z1211 Encounter for screening for malignant neoplasm of colon: Secondary | ICD-10-CM

## 2017-03-28 NOTE — Progress Notes (Signed)
Gastroenterology Pre-Procedure Review  Request Date: 05/13/17 Requesting Physician: Dr. Allen Norris  PATIENT REVIEW QUESTIONS: The patient responded to the following health history questions as indicated:    1. Are you having any GI issues? no 2. Do you have a personal history of Polyps? no 3. Do you have a family history of Colon Cancer or Polyps? no 4. Diabetes Mellitus? no 5. Joint replacements in the past 12 months?no 6. Major health problems in the past 3 months?yes (Cardiac) 7. Any artificial heart valves, MVP, or defibrillator?yes (Pacemaker)    MEDICATIONS & ALLERGIES:    Patient reports the following regarding taking any anticoagulation/antiplatelet therapy:   Plavix, Coumadin, Eliquis, Xarelto, Lovenox, Pradaxa, Brilinta, or Effient? no Aspirin? yes (81 mg daily)  Patient confirms/reports the following medications:  Current Outpatient Medications  Medication Sig Dispense Refill  . ALPRAZolam (XANAX) 1 MG tablet Take 1 tablet (1 mg total) by mouth 3 (three) times daily as needed for anxiety. 60 tablet 2  . aspirin 81 MG tablet Take 81 mg by mouth every evening. Reported on 03/02/2015    . beclomethasone (QVAR) 40 MCG/ACT inhaler Inhale 2 puffs into the lungs 2 (two) times daily.    . carvedilol (COREG) 3.125 MG tablet Take 3.125 mg by mouth 2 (two) times daily with a meal.    . clobetasol ointment (TEMOVATE) 6.22 % Apply 1 application topically 2 (two) times daily. Apply to affected area 30 g 2  . fluticasone (FLONASE) 50 MCG/ACT nasal spray Place 2 sprays into both nostrils daily as needed for allergies or rhinitis.    . furosemide (LASIX) 20 MG tablet Take 20 mg by mouth daily.    Marland Kitchen ibuprofen (ADVIL,MOTRIN) 200 MG tablet Take 800 mg by mouth daily as needed for moderate pain.    Marland Kitchen lisinopril (PRINIVIL,ZESTRIL) 20 MG tablet Take 20 mg by mouth daily.    Marland Kitchen loratadine (CLARITIN REDITABS) 10 MG dissolvable tablet Take 1 tablet (10 mg total) by mouth daily. 120 tablet 4  . montelukast  (SINGULAIR) 10 MG tablet Take 10 mg by mouth at bedtime.    . Multiple Vitamins-Minerals (ALIVE WOMENS 50+) TABS Take 1 tablet by mouth daily.    . Vitamin D, Ergocalciferol, (DRISDOL) 50000 units CAPS capsule Take 1 capsule (50,000 Units total) by mouth 2 (two) times a week. 30 capsule 2   No current facility-administered medications for this visit.     Patient confirms/reports the following allergies:  No Known Allergies  No orders of the defined types were placed in this encounter.   AUTHORIZATION INFORMATION Primary Insurance: 1D#: Group #:  Secondary Insurance: 1D#: Group #:  SCHEDULE INFORMATION: Date: 05/13/17 Time: Location:[facility]

## 2017-03-28 NOTE — Telephone Encounter (Signed)
Gastroenterology Pre-Procedure Review  Request Date:   Requesting Physician: Dr.    PATIENT REVIEW QUESTIONS: The patient responded to the following health history questions as indicated:    1. Are you having any GI issues? no 2. Do you have a personal history of Polyps? no 3. Do you have a family history of Colon Cancer or Polyps? no 4. Diabetes Mellitus? no 5. Joint replacements in the past 12 months?no 6. Major health problems in the past 3 months?yes (pacemaker septemer 12, 2018) 7. Any artificial heart valves, MVP, or defibrillator?yes (pacemaker November 27, 2016)    MEDICATIONS & ALLERGIES:    Patient reports the following regarding taking any anticoagulation/antiplatelet therapy:   Plavix, Coumadin, Eliquis, Xarelto, Lovenox, Pradaxa, Brilinta, or Effient? no Aspirin? yes (ASA 81 mg)  Patient confirms/reports the following medications:  Current Outpatient Medications  Medication Sig Dispense Refill   ALPRAZolam (XANAX) 1 MG tablet Take 1 tablet (1 mg total) by mouth 3 (three) times daily as needed for anxiety. 60 tablet 2   aspirin 81 MG tablet Take 81 mg by mouth every evening. Reported on 03/02/2015     beclomethasone (QVAR) 40 MCG/ACT inhaler Inhale 2 puffs into the lungs 2 (two) times daily.     carvedilol (COREG) 3.125 MG tablet Take 3.125 mg by mouth 2 (two) times daily with a meal.     clobetasol ointment (TEMOVATE) 8.33 % Apply 1 application topically 2 (two) times daily. Apply to affected area 30 g 2   fluticasone (FLONASE) 50 MCG/ACT nasal spray Place 2 sprays into both nostrils daily as needed for allergies or rhinitis.     furosemide (LASIX) 20 MG tablet Take 20 mg by mouth daily.     ibuprofen (ADVIL,MOTRIN) 200 MG tablet Take 800 mg by mouth daily as needed for moderate pain.     lisinopril (PRINIVIL,ZESTRIL) 20 MG tablet Take 20 mg by mouth daily.     loratadine (CLARITIN REDITABS) 10 MG dissolvable tablet Take 1 tablet (10 mg total) by mouth daily.  120 tablet 4   montelukast (SINGULAIR) 10 MG tablet Take 10 mg by mouth at bedtime.     Multiple Vitamins-Minerals (ALIVE WOMENS 50+) TABS Take 1 tablet by mouth daily.     Vitamin D, Ergocalciferol, (DRISDOL) 50000 units CAPS capsule Take 1 capsule (50,000 Units total) by mouth 2 (two) times a week. 30 capsule 2   No current facility-administered medications for this visit.     Patient confirms/reports the following allergies:  No Known Allergies  No orders of the defined types were placed in this encounter.   AUTHORIZATION INFORMATION Primary Insurance: 1D#: Group #:  Secondary Insurance: 1D#: Group #:  SCHEDULE INFORMATION: Date:  Time: Location:

## 2017-04-09 ENCOUNTER — Encounter: Payer: 59 | Admitting: Obstetrics and Gynecology

## 2017-04-09 DIAGNOSIS — R509 Fever, unspecified: Secondary | ICD-10-CM | POA: Diagnosis not present

## 2017-04-09 DIAGNOSIS — B349 Viral infection, unspecified: Secondary | ICD-10-CM | POA: Diagnosis not present

## 2017-04-11 ENCOUNTER — Ambulatory Visit
Admission: RE | Admit: 2017-04-11 | Discharge: 2017-04-11 | Disposition: A | Discharge status: Home / Self Care | Payer: 59 | Source: Ambulatory Visit | Attending: Obstetrics and Gynecology | Admitting: Obstetrics and Gynecology

## 2017-04-11 DIAGNOSIS — Z01419 Encounter for gynecological examination (general) (routine) without abnormal findings: Secondary | ICD-10-CM

## 2017-04-11 DIAGNOSIS — I447 Left bundle-branch block, unspecified: Secondary | ICD-10-CM | POA: Diagnosis not present

## 2017-04-11 DIAGNOSIS — Z1231 Encounter for screening mammogram for malignant neoplasm of breast: Secondary | ICD-10-CM | POA: Diagnosis not present

## 2017-04-11 DIAGNOSIS — I420 Dilated cardiomyopathy: Secondary | ICD-10-CM | POA: Diagnosis not present

## 2017-04-11 DIAGNOSIS — I5022 Chronic systolic (congestive) heart failure: Secondary | ICD-10-CM | POA: Diagnosis not present

## 2017-04-11 DIAGNOSIS — E7801 Familial hypercholesterolemia: Secondary | ICD-10-CM | POA: Diagnosis not present

## 2017-05-10 DIAGNOSIS — R509 Fever, unspecified: Secondary | ICD-10-CM | POA: Diagnosis not present

## 2017-05-10 DIAGNOSIS — R6889 Other general symptoms and signs: Secondary | ICD-10-CM | POA: Diagnosis not present

## 2017-05-22 DIAGNOSIS — J0111 Acute recurrent frontal sinusitis: Secondary | ICD-10-CM | POA: Diagnosis not present

## 2017-05-27 ENCOUNTER — Encounter: Payer: 59 | Admitting: Obstetrics and Gynecology

## 2017-06-04 ENCOUNTER — Encounter: Payer: Self-pay | Admitting: Obstetrics and Gynecology

## 2017-07-09 ENCOUNTER — Encounter: Payer: Self-pay | Admitting: Obstetrics and Gynecology

## 2017-07-11 DIAGNOSIS — I420 Dilated cardiomyopathy: Secondary | ICD-10-CM | POA: Diagnosis not present

## 2017-07-11 DIAGNOSIS — I5022 Chronic systolic (congestive) heart failure: Secondary | ICD-10-CM | POA: Diagnosis not present

## 2017-07-11 DIAGNOSIS — E7801 Familial hypercholesterolemia: Secondary | ICD-10-CM | POA: Diagnosis not present

## 2017-07-11 DIAGNOSIS — I447 Left bundle-branch block, unspecified: Secondary | ICD-10-CM | POA: Diagnosis not present

## 2017-07-21 ENCOUNTER — Encounter: Payer: Self-pay | Admitting: *Deleted

## 2017-07-22 ENCOUNTER — Encounter: Admission: RE | Payer: Self-pay | Source: Ambulatory Visit

## 2017-07-22 ENCOUNTER — Encounter: Payer: Self-pay | Admitting: Anesthesiology

## 2017-07-22 SURGERY — COLONOSCOPY WITH PROPOFOL
Anesthesia: General

## 2017-07-23 ENCOUNTER — Ambulatory Visit: Admission: RE | Admit: 2017-07-23 | Payer: 59 | Source: Ambulatory Visit | Admitting: Gastroenterology

## 2017-07-29 DIAGNOSIS — I420 Dilated cardiomyopathy: Secondary | ICD-10-CM | POA: Diagnosis not present

## 2017-09-02 DIAGNOSIS — I420 Dilated cardiomyopathy: Secondary | ICD-10-CM | POA: Diagnosis not present

## 2017-10-10 DIAGNOSIS — E782 Mixed hyperlipidemia: Secondary | ICD-10-CM | POA: Diagnosis not present

## 2017-10-10 DIAGNOSIS — I420 Dilated cardiomyopathy: Secondary | ICD-10-CM | POA: Diagnosis not present

## 2017-10-10 DIAGNOSIS — I5022 Chronic systolic (congestive) heart failure: Secondary | ICD-10-CM | POA: Diagnosis not present

## 2017-10-10 DIAGNOSIS — I447 Left bundle-branch block, unspecified: Secondary | ICD-10-CM | POA: Diagnosis not present

## 2017-10-28 DIAGNOSIS — I480 Paroxysmal atrial fibrillation: Secondary | ICD-10-CM | POA: Diagnosis not present

## 2017-12-12 DIAGNOSIS — Z23 Encounter for immunization: Secondary | ICD-10-CM | POA: Diagnosis not present

## 2017-12-12 DIAGNOSIS — E782 Mixed hyperlipidemia: Secondary | ICD-10-CM | POA: Diagnosis not present

## 2017-12-12 DIAGNOSIS — I420 Dilated cardiomyopathy: Secondary | ICD-10-CM | POA: Diagnosis not present

## 2017-12-12 DIAGNOSIS — Z7689 Persons encountering health services in other specified circumstances: Secondary | ICD-10-CM | POA: Diagnosis not present

## 2017-12-12 DIAGNOSIS — I5022 Chronic systolic (congestive) heart failure: Secondary | ICD-10-CM | POA: Diagnosis not present

## 2018-01-28 ENCOUNTER — Ambulatory Visit: Payer: 59 | Admitting: Gastroenterology

## 2018-01-28 ENCOUNTER — Encounter: Payer: Self-pay | Admitting: *Deleted

## 2018-01-28 NOTE — Progress Notes (Deleted)
Gastroenterology Consultation  Referring Provider:     Garald Balding* Primary Care Physician:  Gearldine Shown, DO Primary Gastroenterologist:  Dr. Allen Norris     Reason for Consultation:     Screening colonoscopy with a history of a pacemaker        HPI:   Erin Stuart is a 52 y.o. y/o female referred for consultation & management of screening colonoscopy with a history of a pacemaker By Dr. Bobette Mo, Titus Mould, DO.  This patient was called for preop evaluation prior to being set up for screening colonoscopy.  The patient had mentioned that she had a pacemaker placed on September 12 of 2018.  The patient has a history of cardiomyopathy with an ejection fraction reported at 20%.  Past Medical History:  Diagnosis Date  . Anxiety   . Hyperlipidemia   . LBBB (left bundle branch block)     Past Surgical History:  Procedure Laterality Date  . RIGHT/LEFT HEART CATH AND CORONARY ANGIOGRAPHY Bilateral 08/27/2016   Procedure: Right/Left Heart Cath and Coronary Angiography;  Surgeon: Teodoro Spray, MD;  Location: [facility];  Service: Cardiovascular;  Laterality: Bilateral;    Prior to Admission medications   Medication Sig Start Date End Date Taking? Authorizing Provider  ALPRAZolam [name]) 1 MG tablet Take 1 tablet (1 mg total) by mouth 3 (three) times daily as needed for anxiety. 02/18/17   Shambley, Melody N, CNM  aspirin 81 MG tablet Take 81 mg by mouth every evening. Reported on 03/02/2015    [provider]  beclomethasone (QVAR) 40 MCG/ACT inhaler Inhale 2 puffs into the lungs 2 (two) times daily.    [provider]  carvedilol (COREG) 3.125 MG tablet Take 3.125 mg by mouth 2 (two) times daily with a meal.    [provider]  clobetasol ointment (TEMOVATE) 4.62 % Apply 1 application topically 2 (two) times daily. Apply to affected area 02/18/17   Shambley, Melody N, CNM  fluticasone (FLONASE) 50 MCG/ACT nasal spray Place  2 sprays into both nostrils daily as needed for allergies or rhinitis.    [provider]  furosemide (LASIX) 20 MG tablet Take 20 mg by mouth daily.    [provider]  ibuprofen (ADVIL,MOTRIN) 200 MG tablet Take 800 mg by mouth daily as needed for moderate pain.    [provider]  lisinopril (PRINIVIL,ZESTRIL) 20 MG tablet Take 20 mg by mouth daily.    [provider]  loratadine (CLARITIN REDITABS) 10 MG dissolvable tablet Take 1 tablet (10 mg total) by mouth daily. 02/18/17   Shambley, Melody N, CNM  montelukast (SINGULAIR) 10 MG tablet Take 10 mg by mouth at bedtime.    [provider]  Multiple Vitamins-Minerals (ALIVE WOMENS 50+) TABS Take 1 tablet by mouth daily.    [provider]  Vitamin D, Ergocalciferol, (DRISDOL) 50000 units CAPS capsule Take 1 capsule (50,000 Units total) by mouth 2 (two) times a week. 03/24/17   Joylene Igo, CNM    Family History  Problem Relation Age of Onset  . Cancer Paternal Aunt        ovairan     Social History   Tobacco Use  . Smoking status: Never Smoker  . Smokeless tobacco: Never Used  Substance Use Topics  . Alcohol use: No  . Drug use: No    Allergies as of 01/28/2018  . (No Known Allergies)    Review of Systems:    All  systems reviewed and negative except where noted in HPI.   Physical Exam:  There were no vitals taken for this visit. No LMP recorded. Patient is perimenopausal. General:   Alert,  Well-developed, well-nourished, pleasant and cooperative in NAD Head:  Normocephalic and atraumatic. Eyes:  Sclera clear, no icterus.   Conjunctiva pink. Ears:  Normal auditory acuity. Nose:  No deformity, discharge, or lesions. Mouth:  No deformity or lesions,oropharynx pink & moist. Neck:  Supple; no masses or thyromegaly. Lungs:  Respirations even and unlabored.  Clear throughout to auscultation.   No wheezes, crackles, or rhonchi. No acute distress. Heart:  Regular rate and  rhythm; no murmurs, clicks, rubs, or gallops. Abdomen:  Normal bowel sounds.  No bruits.  Soft, non-tender and non-distended without masses, hepatosplenomegaly or hernias noted.  No guarding or rebound tenderness.  Negative Carnett sign.   Rectal:  Deferred.  Msk:  Symmetrical without gross deformities.  Good, equal movement & strength bilaterally. Pulses:  Normal pulses noted. Extremities:  No clubbing or edema.  No cyanosis. Neurologic:  Alert and oriented x3;  grossly normal neurologically. Skin:  Intact without significant lesions or rashes.  No jaundice. Lymph Nodes:  No significant cervical adenopathy. Psych:  Alert and cooperative. Normal mood and affect.  Imaging Studies: No results found.  Assessment and Plan:   Erin Stuart is a 52 y.o. y/o female ***  Lucilla Lame, MD. Marval Regal    Note: This dictation was prepared with Dragon dictation along with smaller phrase technology. Any transcriptional errors that result from this process are unintentional.

## 2018-02-06 DIAGNOSIS — I447 Left bundle-branch block, unspecified: Secondary | ICD-10-CM | POA: Diagnosis not present

## 2018-02-06 DIAGNOSIS — I5022 Chronic systolic (congestive) heart failure: Secondary | ICD-10-CM | POA: Diagnosis not present

## 2018-02-06 DIAGNOSIS — I420 Dilated cardiomyopathy: Secondary | ICD-10-CM | POA: Diagnosis not present

## 2018-02-24 DIAGNOSIS — I5022 Chronic systolic (congestive) heart failure: Secondary | ICD-10-CM | POA: Diagnosis not present

## 2018-03-06 DIAGNOSIS — Z0000 Encounter for general adult medical examination without abnormal findings: Secondary | ICD-10-CM | POA: Diagnosis not present

## 2018-03-12 DIAGNOSIS — E782 Mixed hyperlipidemia: Secondary | ICD-10-CM | POA: Diagnosis not present

## 2018-03-12 DIAGNOSIS — I420 Dilated cardiomyopathy: Secondary | ICD-10-CM | POA: Diagnosis not present

## 2018-03-12 DIAGNOSIS — I447 Left bundle-branch block, unspecified: Secondary | ICD-10-CM | POA: Diagnosis not present

## 2018-04-13 ENCOUNTER — Other Ambulatory Visit: Payer: Self-pay | Admitting: Obstetrics and Gynecology

## 2018-05-01 ENCOUNTER — Other Ambulatory Visit: Payer: Self-pay | Admitting: Physician Assistant

## 2018-05-01 DIAGNOSIS — Z124 Encounter for screening for malignant neoplasm of cervix: Secondary | ICD-10-CM | POA: Diagnosis not present

## 2018-05-01 DIAGNOSIS — Z1231 Encounter for screening mammogram for malignant neoplasm of breast: Secondary | ICD-10-CM

## 2018-05-01 DIAGNOSIS — Z0000 Encounter for general adult medical examination without abnormal findings: Secondary | ICD-10-CM | POA: Diagnosis not present

## 2018-05-01 DIAGNOSIS — I447 Left bundle-branch block, unspecified: Secondary | ICD-10-CM | POA: Diagnosis not present

## 2018-05-01 DIAGNOSIS — I420 Dilated cardiomyopathy: Secondary | ICD-10-CM | POA: Diagnosis not present

## 2018-05-01 DIAGNOSIS — E782 Mixed hyperlipidemia: Secondary | ICD-10-CM | POA: Diagnosis not present

## 2018-05-26 DIAGNOSIS — R001 Bradycardia, unspecified: Secondary | ICD-10-CM | POA: Diagnosis not present

## 2018-06-05 ENCOUNTER — Other Ambulatory Visit: Payer: Self-pay | Admitting: Obstetrics and Gynecology

## 2018-07-17 DIAGNOSIS — I5022 Chronic systolic (congestive) heart failure: Secondary | ICD-10-CM | POA: Diagnosis not present

## 2018-07-17 DIAGNOSIS — I447 Left bundle-branch block, unspecified: Secondary | ICD-10-CM | POA: Diagnosis not present

## 2018-07-17 DIAGNOSIS — E782 Mixed hyperlipidemia: Secondary | ICD-10-CM | POA: Diagnosis not present

## 2018-07-17 DIAGNOSIS — I420 Dilated cardiomyopathy: Secondary | ICD-10-CM | POA: Diagnosis not present

## 2018-08-07 DIAGNOSIS — I420 Dilated cardiomyopathy: Secondary | ICD-10-CM | POA: Diagnosis not present

## 2018-10-09 DIAGNOSIS — I420 Dilated cardiomyopathy: Secondary | ICD-10-CM | POA: Diagnosis not present

## 2018-11-04 DIAGNOSIS — E782 Mixed hyperlipidemia: Secondary | ICD-10-CM | POA: Diagnosis not present

## 2018-11-04 DIAGNOSIS — I420 Dilated cardiomyopathy: Secondary | ICD-10-CM | POA: Diagnosis not present

## 2018-11-04 DIAGNOSIS — I5022 Chronic systolic (congestive) heart failure: Secondary | ICD-10-CM | POA: Diagnosis not present

## 2018-11-04 DIAGNOSIS — I447 Left bundle-branch block, unspecified: Secondary | ICD-10-CM | POA: Diagnosis not present

## 2018-11-20 DIAGNOSIS — M25561 Pain in right knee: Secondary | ICD-10-CM | POA: Diagnosis not present

## 2018-11-20 DIAGNOSIS — I420 Dilated cardiomyopathy: Secondary | ICD-10-CM | POA: Diagnosis not present

## 2018-11-20 DIAGNOSIS — I5022 Chronic systolic (congestive) heart failure: Secondary | ICD-10-CM | POA: Diagnosis not present

## 2018-11-20 DIAGNOSIS — E782 Mixed hyperlipidemia: Secondary | ICD-10-CM | POA: Diagnosis not present

## 2019-01-08 DIAGNOSIS — R7309 Other abnormal glucose: Secondary | ICD-10-CM | POA: Diagnosis not present

## 2019-01-08 DIAGNOSIS — E782 Mixed hyperlipidemia: Secondary | ICD-10-CM | POA: Diagnosis not present

## 2019-01-08 DIAGNOSIS — Z0183 Encounter for blood typing: Secondary | ICD-10-CM | POA: Diagnosis not present

## 2019-01-08 DIAGNOSIS — Z23 Encounter for immunization: Secondary | ICD-10-CM | POA: Diagnosis not present

## 2019-01-08 DIAGNOSIS — E559 Vitamin D deficiency, unspecified: Secondary | ICD-10-CM | POA: Diagnosis not present

## 2019-01-08 DIAGNOSIS — I5022 Chronic systolic (congestive) heart failure: Secondary | ICD-10-CM | POA: Diagnosis not present

## 2019-02-02 ENCOUNTER — Other Ambulatory Visit: Payer: Self-pay

## 2019-02-02 DIAGNOSIS — Z20822 Contact with and (suspected) exposure to covid-19: Secondary | ICD-10-CM

## 2019-02-04 LAB — NOVEL CORONAVIRUS, NAA: SARS-CoV-2, NAA: NOT DETECTED

## 2019-02-15 DIAGNOSIS — M170 Bilateral primary osteoarthritis of knee: Secondary | ICD-10-CM | POA: Diagnosis not present

## 2019-02-15 DIAGNOSIS — M25562 Pain in left knee: Secondary | ICD-10-CM | POA: Diagnosis not present

## 2019-02-15 DIAGNOSIS — M25561 Pain in right knee: Secondary | ICD-10-CM | POA: Diagnosis not present

## 2019-02-24 DIAGNOSIS — I5022 Chronic systolic (congestive) heart failure: Secondary | ICD-10-CM | POA: Diagnosis not present

## 2019-03-26 ENCOUNTER — Ambulatory Visit
Admission: RE | Admit: 2019-03-26 | Discharge: 2019-03-26 | Disposition: A | Discharge status: Home / Self Care | Payer: No Typology Code available for payment source | Source: Ambulatory Visit | Attending: Physician Assistant | Admitting: Physician Assistant

## 2019-03-26 DIAGNOSIS — I447 Left bundle-branch block, unspecified: Secondary | ICD-10-CM | POA: Diagnosis not present

## 2019-03-26 DIAGNOSIS — I420 Dilated cardiomyopathy: Secondary | ICD-10-CM | POA: Diagnosis not present

## 2019-03-26 DIAGNOSIS — Z1231 Encounter for screening mammogram for malignant neoplasm of breast: Secondary | ICD-10-CM | POA: Insufficient documentation

## 2019-03-26 DIAGNOSIS — I5022 Chronic systolic (congestive) heart failure: Secondary | ICD-10-CM | POA: Diagnosis not present

## 2019-03-26 DIAGNOSIS — I208 Other forms of angina pectoris: Secondary | ICD-10-CM | POA: Diagnosis not present

## 2019-04-13 DIAGNOSIS — M170 Bilateral primary osteoarthritis of knee: Secondary | ICD-10-CM | POA: Diagnosis not present

## 2019-06-01 DIAGNOSIS — I5022 Chronic systolic (congestive) heart failure: Secondary | ICD-10-CM | POA: Diagnosis not present

## 2019-07-16 DIAGNOSIS — E782 Mixed hyperlipidemia: Secondary | ICD-10-CM | POA: Diagnosis not present

## 2019-07-16 DIAGNOSIS — I420 Dilated cardiomyopathy: Secondary | ICD-10-CM | POA: Diagnosis not present

## 2019-07-16 DIAGNOSIS — I5022 Chronic systolic (congestive) heart failure: Secondary | ICD-10-CM | POA: Diagnosis not present

## 2019-07-16 DIAGNOSIS — I447 Left bundle-branch block, unspecified: Secondary | ICD-10-CM | POA: Diagnosis not present

## 2019-08-31 DIAGNOSIS — I5022 Chronic systolic (congestive) heart failure: Secondary | ICD-10-CM | POA: Diagnosis not present

## 2019-09-10 DIAGNOSIS — I420 Dilated cardiomyopathy: Secondary | ICD-10-CM | POA: Diagnosis not present

## 2019-10-04 DIAGNOSIS — I420 Dilated cardiomyopathy: Secondary | ICD-10-CM | POA: Diagnosis not present

## 2019-10-04 DIAGNOSIS — I447 Left bundle-branch block, unspecified: Secondary | ICD-10-CM | POA: Diagnosis not present

## 2019-10-04 DIAGNOSIS — E782 Mixed hyperlipidemia: Secondary | ICD-10-CM | POA: Diagnosis not present

## 2019-10-04 DIAGNOSIS — I5022 Chronic systolic (congestive) heart failure: Secondary | ICD-10-CM | POA: Diagnosis not present

## 2019-11-30 DIAGNOSIS — I5022 Chronic systolic (congestive) heart failure: Secondary | ICD-10-CM | POA: Diagnosis not present

## 2019-12-21 DIAGNOSIS — M25561 Pain in right knee: Secondary | ICD-10-CM | POA: Diagnosis not present

## 2019-12-21 DIAGNOSIS — E782 Mixed hyperlipidemia: Secondary | ICD-10-CM | POA: Diagnosis not present

## 2019-12-21 DIAGNOSIS — I420 Dilated cardiomyopathy: Secondary | ICD-10-CM | POA: Diagnosis not present

## 2019-12-21 DIAGNOSIS — Z23 Encounter for immunization: Secondary | ICD-10-CM | POA: Diagnosis not present

## 2019-12-21 DIAGNOSIS — Z0000 Encounter for general adult medical examination without abnormal findings: Secondary | ICD-10-CM | POA: Diagnosis not present

## 2020-02-29 DIAGNOSIS — I5022 Chronic systolic (congestive) heart failure: Secondary | ICD-10-CM | POA: Diagnosis not present

## 2020-05-03 IMAGING — MG DIGITAL SCREENING BILAT W/ TOMO W/ CAD
6 of 10 series · 6 of 30 positions shown · non-contrast
Comparison: Previous exam(s).

CLINICAL DATA: Screening.

EXAM:
DIGITAL SCREENING BILATERAL MAMMOGRAM WITH TOMO AND CAD

[L MLO synth-2D (1 of 2)]
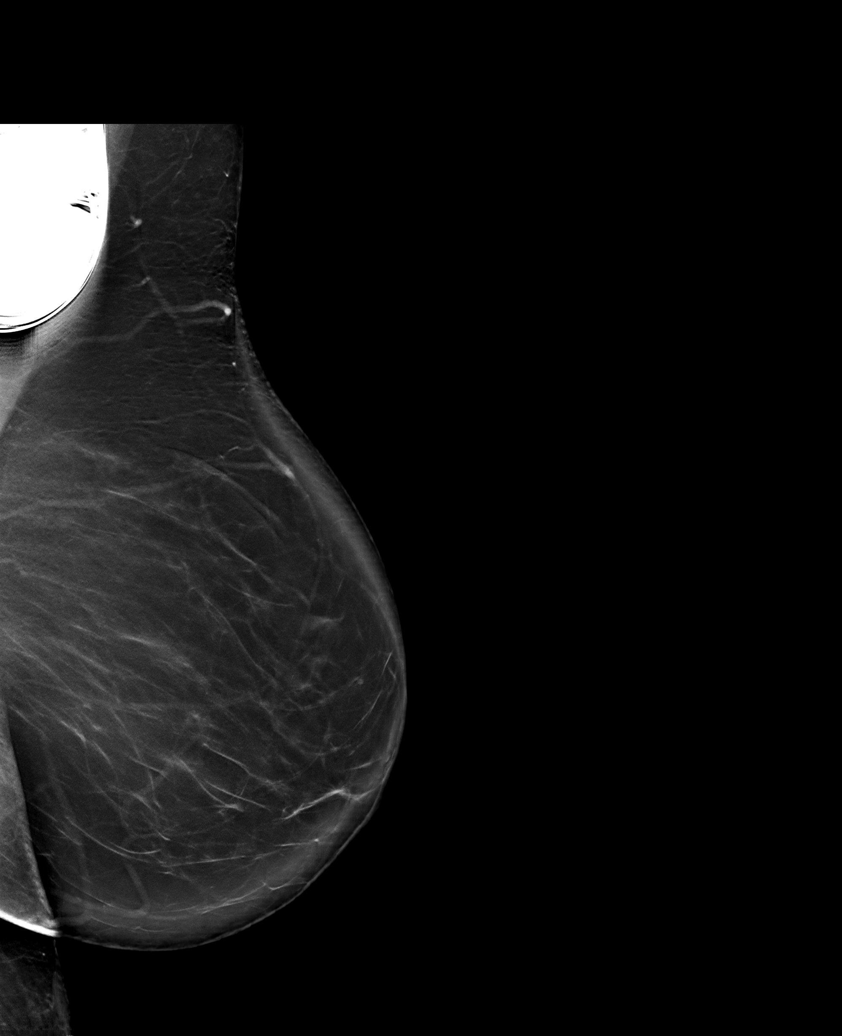

[L MLO synth-2D (2 of 2)]
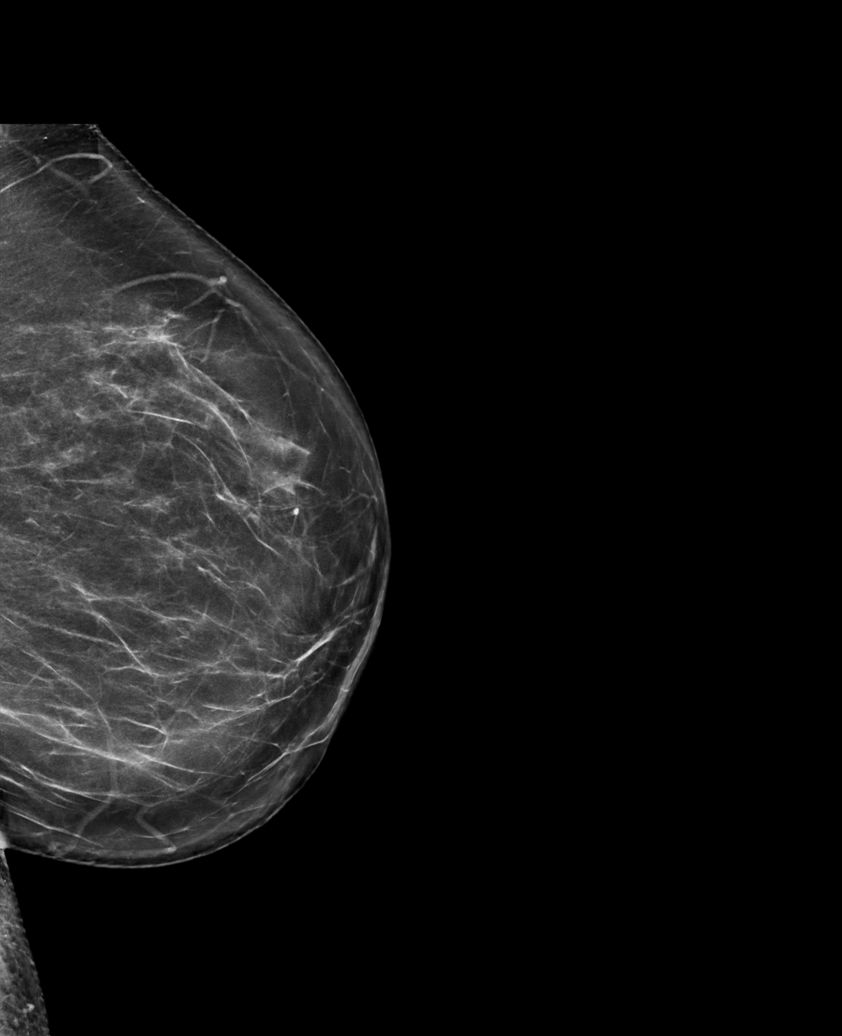

[R CC synth-2D]
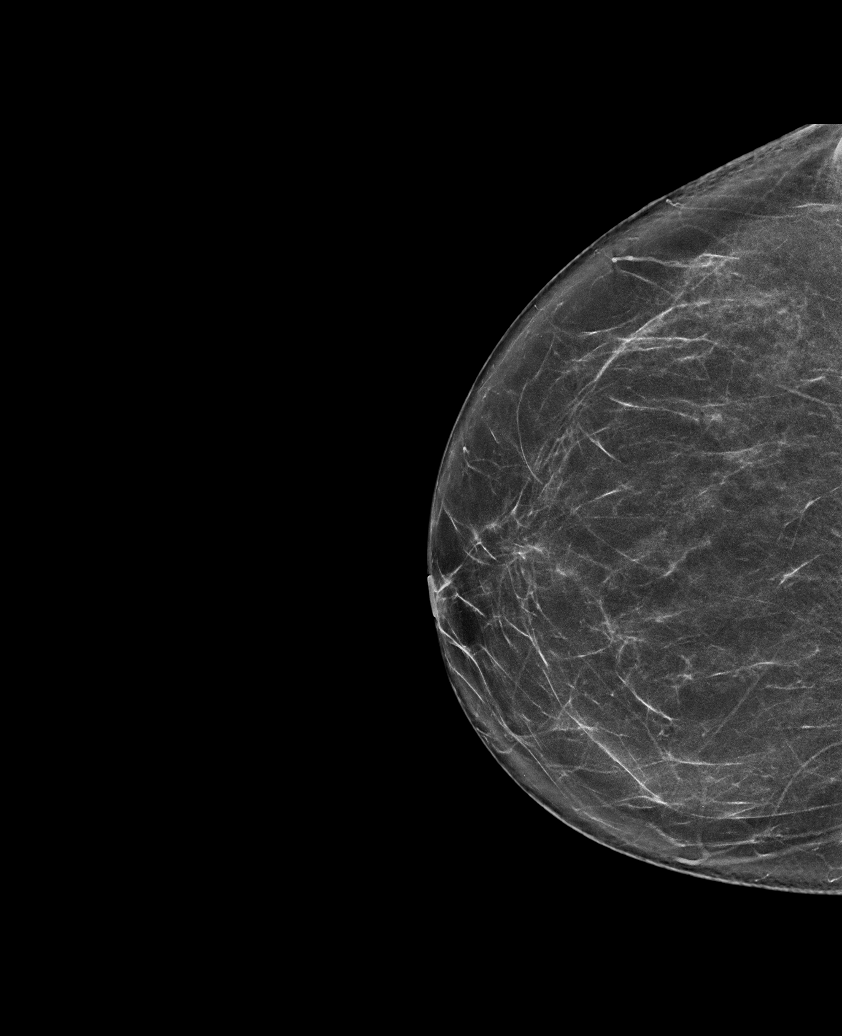

[L CC synth-2D]
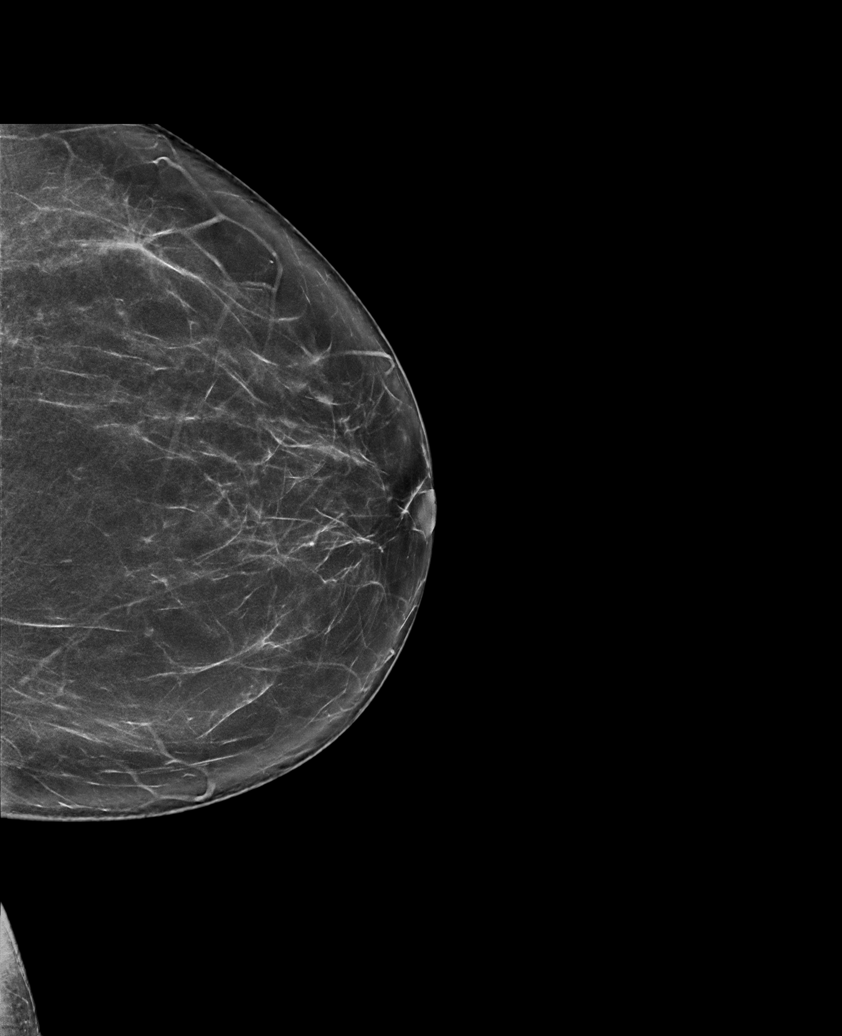

[R MLO synth-2D]
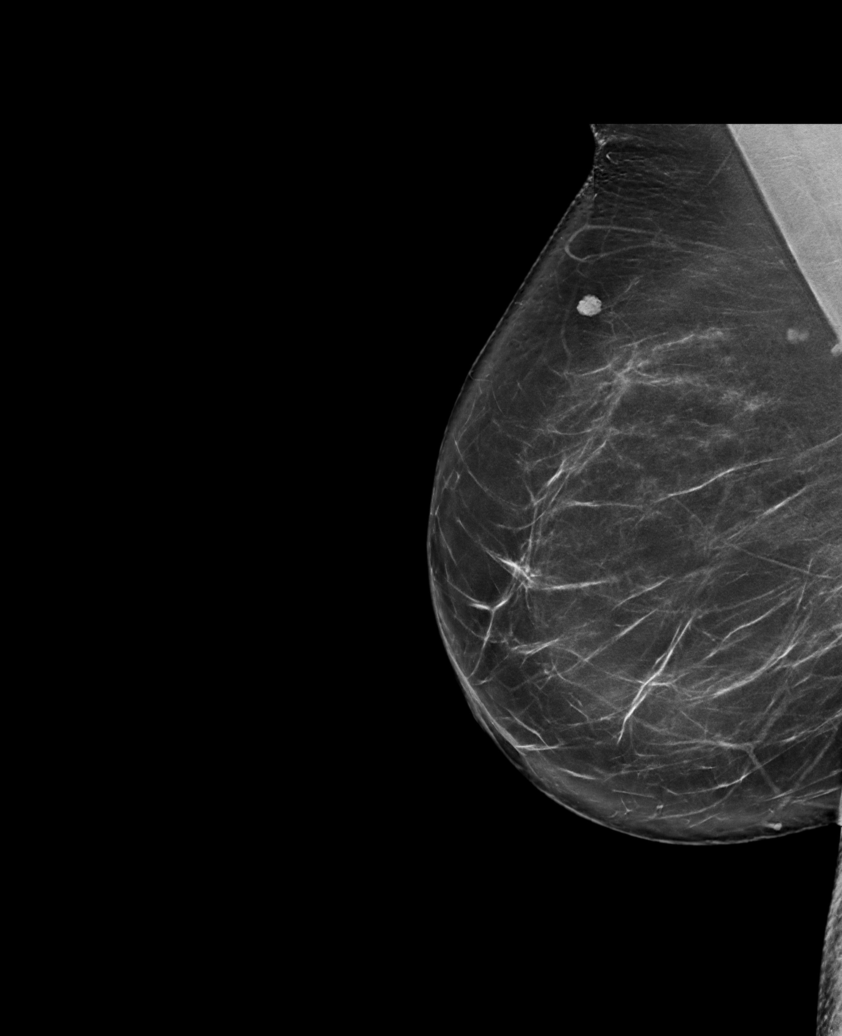

[L MLO tomo · tomo slice 53/106.0]
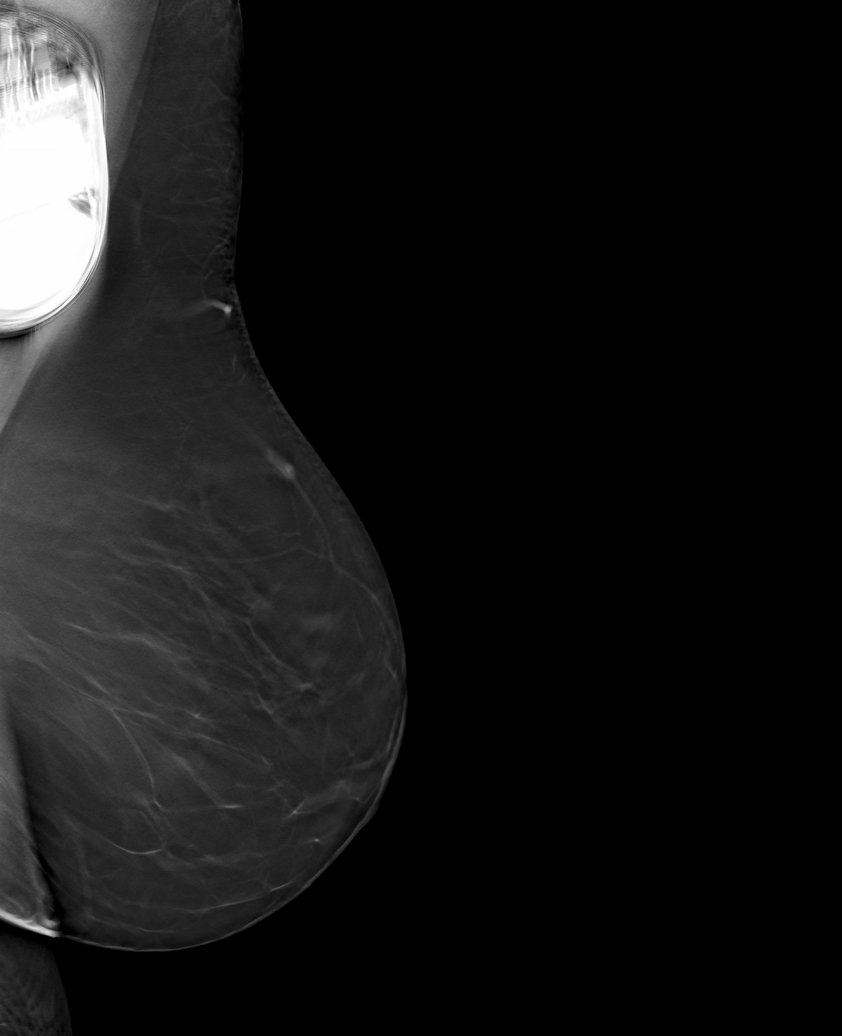

[6 of 30 positions shown; findings below may reference images not displayed]

ACR Breast Density Category b: There are scattered areas of
fibroglandular density.
FINDINGS: There are no findings suspicious for malignancy. Images were
processed with CAD.
IMPRESSION: No mammographic evidence of malignancy. A result letter of this
screening mammogram will be mailed directly to the patient.

RECOMMENDATION:
Screening mammogram in one year. (Code:CN-U-775)

BI-RADS CATEGORY  1: Negative.

## 2020-05-19 ENCOUNTER — Telehealth (INDEPENDENT_AMBULATORY_CARE_PROVIDER_SITE_OTHER): Payer: Self-pay | Admitting: Gastroenterology

## 2020-05-19 ENCOUNTER — Other Ambulatory Visit: Payer: Self-pay

## 2020-05-19 DIAGNOSIS — I208 Other forms of angina pectoris: Secondary | ICD-10-CM | POA: Diagnosis not present

## 2020-05-19 DIAGNOSIS — I5022 Chronic systolic (congestive) heart failure: Secondary | ICD-10-CM | POA: Diagnosis not present

## 2020-05-19 DIAGNOSIS — E782 Mixed hyperlipidemia: Secondary | ICD-10-CM | POA: Diagnosis not present

## 2020-05-19 DIAGNOSIS — Z1211 Encounter for screening for malignant neoplasm of colon: Secondary | ICD-10-CM

## 2020-05-19 DIAGNOSIS — I447 Left bundle-branch block, unspecified: Secondary | ICD-10-CM | POA: Diagnosis not present

## 2020-05-19 MED ORDER — NA SULFATE-K SULFATE-MG SULF 17.5-3.13-1.6 GM/177ML PO SOLN: 1.0000 | Freq: Once | ORAL | 0 refills | Status: AC

## 2020-05-19 NOTE — Progress Notes (Signed)
Gastroenterology Pre-Procedure Review  Request Date: Friday 06/16/20 Requesting Physician: Dr. Vicente Males  PATIENT REVIEW QUESTIONS: The patient responded to the following health history questions as indicated:    1. Are you having any GI issues? no 2. Do you have a personal history of Polyps? no 3. Do you have a family history of Colon Cancer or Polyps? yes (Grandmother had colon cancer.  Father had ulcerative colitis) 4. Diabetes Mellitus? no 5. Joint replacements in the past 12 months?no 6. Major health problems in the past 3 months?Pt has Left Bundle Branch Block-Cardiac clearance has been sent to Dr. Leamon Arnt office 7. Any artificial heart valves, MVP, or defibrillator?no    MEDICATIONS & ALLERGIES:    Patient reports the following regarding taking any anticoagulation/antiplatelet therapy:   Plavix, Coumadin, Eliquis, Xarelto, Lovenox, Pradaxa, Brilinta, or Effient? no Aspirin? 81 mg daily  Patient confirms/reports the following medications:  Current Outpatient Medications  Medication Sig Dispense Refill  . ALPRAZolam (XANAX) 1 MG tablet Take 1 tablet (1 mg total) by mouth 3 (three) times daily as needed for anxiety. 60 tablet 2  . aspirin 81 MG tablet Take 81 mg by mouth every evening. Reported on 03/02/2015    . fluticasone (FLONASE) 50 MCG/ACT nasal spray Place 2 sprays into both nostrils daily as needed for allergies or rhinitis.    . furosemide (LASIX) 20 MG tablet Take 20 mg by mouth daily.    Marland Kitchen ibuprofen (ADVIL,MOTRIN) 200 MG tablet Take 800 mg by mouth daily as needed for moderate pain.    Marland Kitchen lisinopril (PRINIVIL,ZESTRIL) 20 MG tablet Take 20 mg by mouth daily.    . montelukast (SINGULAIR) 10 MG tablet Take 10 mg by mouth at bedtime.    . Multiple Vitamins-Minerals (ALIVE WOMENS 50+) TABS Take 1 tablet by mouth daily.    . Na Sulfate-K Sulfate-Mg Sulf 17.5-3.13-1.6 GM/177ML SOLN Take 1 kit by mouth once for 1 dose. 354 mL 0  . Vitamin D, Ergocalciferol, (DRISDOL) 1.25 MG (50000 UT)  CAPS capsule TAKE ONE CAPSULE BY MOUTH 2 TIMES A WEEK 30 capsule 2  . beclomethasone (QVAR) 40 MCG/ACT inhaler Inhale 2 puffs into the lungs 2 (two) times daily. (Patient not taking: Reported on 05/19/2020)    . carvedilol (COREG) 6.25 MG tablet Take 6.25 mg by mouth 2 (two) times daily.    Marland Kitchen CLARITIN REDITABS 10 MG dissolvable tablet DISSOLVE ONE TABLET BY MOUTH DAILY (Patient not taking: Reported on 05/19/2020) 120 tablet 4  . clobetasol ointment (TEMOVATE) 0.09 % Apply 1 application topically 2 (two) times daily. Apply to affected area (Patient not taking: Reported on 05/19/2020) 30 g 2  . losartan (COZAAR) 25 MG tablet Take 25 mg by mouth daily.    . rosuvastatin (CRESTOR) 5 MG tablet Take 5 mg by mouth daily.    Marland Kitchen spironolactone (ALDACTONE) 25 MG tablet Take 25 mg by mouth daily.     No current facility-administered medications for this visit.    Patient confirms/reports the following allergies:  No Known Allergies  Orders Placed This Encounter  Procedures  . Procedural/ Surgical Case Request: COLONOSCOPY WITH PROPOFOL    Standing Status:   Standing    Number of Occurrences:   1    Order Specific Question:   Pre-op diagnosis    Answer:   SCREENING COLONOSCOPY    Order Specific Question:   CPT Code    Answer:   38182    AUTHORIZATION INFORMATION Primary Insurance: 1D#: Group #:  Secondary Insurance: 1D#: Group #:  SCHEDULE INFORMATION: Date: 06/16/20 Time: Location:[facility] 

## 2020-05-30 DIAGNOSIS — I5022 Chronic systolic (congestive) heart failure: Secondary | ICD-10-CM | POA: Diagnosis not present

## 2020-06-13 ENCOUNTER — Other Ambulatory Visit: Payer: Self-pay

## 2020-06-13 ENCOUNTER — Telehealth: Payer: Self-pay

## 2020-06-13 ENCOUNTER — Telehealth: Payer: Self-pay | Admitting: Gastroenterology

## 2020-06-13 MED ORDER — NA SULFATE-K SULFATE-MG SULF 17.5-3.13-1.6 GM/177ML PO SOLN: 1.0000 | Freq: Once | ORAL | 0 refills | Status: DC

## 2020-06-13 MED ORDER — NA SULFATE-K SULFATE-MG SULF 17.5-3.13-1.6 GM/177ML PO SOLN: 1.0000 | Freq: Once | ORAL | 0 refills | Status: AC

## 2020-06-13 NOTE — Telephone Encounter (Signed)
Patient called stating that her [facility] in [place] does not have her prep.

## 2020-06-13 NOTE — Telephone Encounter (Signed)
Patients call has been returned.  Earlier today I informed her that I was advised that we will need to reschedule her colonoscopy due to cardiac changes-echo testing would be needed.  Patient stated that when she saw Dr. Ubaldo Glassing a few weeks ago everything was fine.  She thought she was okay to have her colonoscopy.   After this call, I re-faxed Cardiac Clearance to Dr. Bethanne Ginger office for clarification.  Pt made a phone call as well.  Pt called me back to let me know that the clearance is supposed to be faxed back to me this afternoon.  I rechecked my faxes and informed her that I had not received the completed request.  I will follow up again in the morning and contact patient.  Thanks,  [place], [place]

## 2020-06-13 NOTE — Telephone Encounter (Signed)
Patient called asking about her Caridac clearance.  Her procedure is scheduled for 06/16/20.  Please call patient

## 2020-06-14 ENCOUNTER — Other Ambulatory Visit: Payer: Self-pay

## 2020-06-14 ENCOUNTER — Other Ambulatory Visit
Admission: RE | Admit: 2020-06-14 | Discharge: 2020-06-14 | Disposition: A | Discharge status: Home / Self Care | Payer: 59 | Source: Ambulatory Visit | Attending: Gastroenterology | Admitting: Gastroenterology

## 2020-06-14 ENCOUNTER — Telehealth: Payer: Self-pay | Admitting: Gastroenterology

## 2020-06-14 DIAGNOSIS — Z20822 Contact with and (suspected) exposure to covid-19: Secondary | ICD-10-CM | POA: Diagnosis not present

## 2020-06-14 DIAGNOSIS — Z01812 Encounter for preprocedural laboratory examination: Secondary | ICD-10-CM | POA: Insufficient documentation

## 2020-06-14 LAB — SARS CORONAVIRUS 2 (TAT 6-24 HRS): SARS Coronavirus 2: NEGATIVE

## 2020-06-14 NOTE — Telephone Encounter (Signed)
Returned patients call. Informed her that I followed up with Dr. Bethanne Ginger office this morning, however the nurses were in a meeting and I had to leave a voice message.  She said she will follow up as well this morning after her COVID test.  Thanks,  Sharyn Lull, [place]

## 2020-06-14 NOTE — Telephone Encounter (Signed)
Spoke with patient and she states [facility] sent over the prep yesterday. Pt was able to pick up on yesterday.

## 2020-06-14 NOTE — Telephone Encounter (Signed)
Patient called LVM asking for call back.  She stated that she is waiting on call back from nurse at Cardiologist . She also states that her Cardiologist confirmed that forms were faxed yesterday.  Please call patient

## 2020-06-14 NOTE — Telephone Encounter (Signed)
Cardiac clearance has been granted per Dr. Bethanne Ginger Cardiac Clearance request received this morning.  Patient has been informed Cardiac clearance has been granted.  Fax will be placed at front office to be scanned into chart.  Thanks,  [name], [place]

## 2020-06-15 ENCOUNTER — Encounter: Payer: Self-pay | Admitting: Gastroenterology

## 2020-06-16 ENCOUNTER — Ambulatory Visit
Admission: RE | Admit: 2020-06-16 | Discharge: 2020-06-16 | Disposition: A | Discharge status: Home / Self Care | Payer: 59 | Attending: Gastroenterology | Admitting: Gastroenterology

## 2020-06-16 ENCOUNTER — Ambulatory Visit: Payer: 59 | Admitting: Registered Nurse

## 2020-06-16 ENCOUNTER — Other Ambulatory Visit: Payer: Self-pay

## 2020-06-16 ENCOUNTER — Encounter: Payer: Self-pay | Admitting: Gastroenterology

## 2020-06-16 ENCOUNTER — Encounter
Admission: RE | Disposition: A | Discharge status: Home / Self Care | Payer: Self-pay | Source: Home / Self Care | Attending: Gastroenterology

## 2020-06-16 DIAGNOSIS — Z7951 Long term (current) use of inhaled steroids: Secondary | ICD-10-CM | POA: Insufficient documentation

## 2020-06-16 DIAGNOSIS — E559 Vitamin D deficiency, unspecified: Secondary | ICD-10-CM | POA: Diagnosis not present

## 2020-06-16 DIAGNOSIS — D120 Benign neoplasm of cecum: Secondary | ICD-10-CM | POA: Diagnosis not present

## 2020-06-16 DIAGNOSIS — Z7982 Long term (current) use of aspirin: Secondary | ICD-10-CM | POA: Insufficient documentation

## 2020-06-16 DIAGNOSIS — D122 Benign neoplasm of ascending colon: Secondary | ICD-10-CM | POA: Diagnosis not present

## 2020-06-16 DIAGNOSIS — K5730 Diverticulosis of large intestine without perforation or abscess without bleeding: Secondary | ICD-10-CM | POA: Insufficient documentation

## 2020-06-16 DIAGNOSIS — K635 Polyp of colon: Secondary | ICD-10-CM | POA: Diagnosis not present

## 2020-06-16 DIAGNOSIS — K640 First degree hemorrhoids: Secondary | ICD-10-CM | POA: Diagnosis not present

## 2020-06-16 DIAGNOSIS — R69 Illness, unspecified: Secondary | ICD-10-CM | POA: Diagnosis not present

## 2020-06-16 DIAGNOSIS — Z1211 Encounter for screening for malignant neoplasm of colon: Secondary | ICD-10-CM | POA: Diagnosis not present

## 2020-06-16 DIAGNOSIS — E785 Hyperlipidemia, unspecified: Secondary | ICD-10-CM | POA: Diagnosis not present

## 2020-06-16 DIAGNOSIS — K5790 Diverticulosis of intestine, part unspecified, without perforation or abscess without bleeding: Secondary | ICD-10-CM | POA: Diagnosis not present

## 2020-06-16 HISTORY — PX: COLONOSCOPY WITH PROPOFOL: SHX5780

## 2020-06-16 SURGERY — COLONOSCOPY WITH PROPOFOL
Anesthesia: General

## 2020-06-16 MED ORDER — SODIUM CHLORIDE 0.9 % IV SOLN
INTRAVENOUS | Status: DC
  Administered 2020-06-16: 20 mL/h via INTRAVENOUS

## 2020-06-16 MED ORDER — PROPOFOL 500 MG/50ML IV EMUL
INTRAVENOUS | Status: DC | PRN
  Administered 2020-06-16: 140 ug/kg/min via INTRAVENOUS

## 2020-06-16 MED ORDER — PROPOFOL 10 MG/ML IV BOLUS
INTRAVENOUS | Status: DC | PRN
  Administered 2020-06-16: 80 mg via INTRAVENOUS
  Administered 2020-06-16 (×2): 30 mg via INTRAVENOUS
  Administered 2020-06-16: 20 mg via INTRAVENOUS

## 2020-06-16 MED ORDER — DEXMEDETOMIDINE HCL 200 MCG/2ML IV SOLN
INTRAVENOUS | Status: DC | PRN
  Administered 2020-06-16: 12 ug via INTRAVENOUS
  Administered 2020-06-16: 8 ug via INTRAVENOUS

## 2020-06-16 NOTE — Anesthesia Postprocedure Evaluation (Signed)
Anesthesia Post Note  Patient: Erin Stuart  Procedure(s) Performed: COLONOSCOPY WITH PROPOFOL (N/A )  Patient location during evaluation: Endoscopy Anesthesia Type: General Level of consciousness: awake and alert and oriented Pain management: pain level controlled Vital Signs Assessment: post-procedure vital signs reviewed and stable Respiratory status: spontaneous breathing, nonlabored ventilation and respiratory function stable Cardiovascular status: blood pressure returned to baseline and stable Postop Assessment: no signs of nausea or vomiting Anesthetic complications: no   No complications documented.   Last Vitals:  Vitals:   06/16/20 0822 06/16/20 0832  BP: 100/62 112/73  Pulse: 62 61  Resp: 20 16  Temp:    SpO2: 96% 98%    Last Pain:  Vitals:   06/16/20 0832  TempSrc:   PainSc: 0-No pain                 Jonnie Kubly

## 2020-06-16 NOTE — Transfer of Care (Signed)
Immediate Anesthesia Transfer of Care Note  Patient: Erin Stuart  Procedure(s) Performed: COLONOSCOPY WITH PROPOFOL (N/A )  Patient Location: PACU  Anesthesia Type:General  Level of Consciousness: sedated  Airway & Oxygen Therapy: Patient Spontanous Breathing and Patient connected to nasal cannula oxygen  Post-op Assessment: Report given to RN and Post -op Vital signs reviewed and stable  Post vital signs: Reviewed and stable  Last Vitals:  Vitals Value Taken Time  BP 110/46 06/16/20 0814  Temp    Pulse 59 06/16/20 0814  Resp 20 06/16/20 0814  SpO2 93 % 06/16/20 0814  Vitals shown include unvalidated device data.  Last Pain:  Vitals:   06/16/20 0812  TempSrc: Temporal  PainSc: Asleep         Complications: No complications documented.

## 2020-06-16 NOTE — H&P (Signed)
Erin Bellows, MD [address] [place], [place], [place], [address] [address], [place], [place], [place], [address] Phone: [phone]  Fax: [phone]  Primary Care Physician:  Marinda Elk, MD   Pre-Procedure History & Physical: HPI:  Erin Stuart is a 55 y.o. female is here for an colonoscopy.   Past Medical History:  Diagnosis Date  . Anxiety   . Hyperlipidemia   . LBBB (left bundle branch block)     Past Surgical History:  Procedure Laterality Date  . RIGHT/LEFT HEART CATH AND CORONARY ANGIOGRAPHY Bilateral 08/27/2016   Procedure: Right/Left Heart Cath and Coronary Angiography;  Surgeon: Teodoro Spray, MD;  Location: [facility];  Service: Cardiovascular;  Laterality: Bilateral;    Prior to Admission medications   Medication Sig Start Date End Date Taking? Authorizing Provider  ALPRAZolam [name]) 1 MG tablet Take 1 tablet (1 mg total) by mouth 3 (three) times daily as needed for anxiety. 02/18/17  Yes Shambley, Melody N, CNM  aspirin 81 MG tablet Take 81 mg by mouth every evening. Reported on 03/02/2015   Yes [provider]  carvedilol (COREG) 6.25 MG tablet Take 6.25 mg by mouth 2 (two) times daily. 04/28/20  Yes [provider]  fluticasone (FLONASE) 50 MCG/ACT nasal spray Place 2 sprays into both nostrils daily as needed for allergies or rhinitis.   Yes [provider]  furosemide (LASIX) 20 MG tablet Take 20 mg by mouth daily.   Yes [provider]  ibuprofen (ADVIL,MOTRIN) 200 MG tablet Take 800 mg by mouth daily as needed for moderate pain.   Yes [provider]  lisinopril (PRINIVIL,ZESTRIL) 20 MG tablet Take 20 mg by mouth daily.   Yes [provider]  losartan (COZAAR) 25 MG tablet Take 25 mg by mouth daily. 04/28/20  Yes [provider]  montelukast (SINGULAIR) 10 MG tablet Take 10 mg by mouth at bedtime.   Yes [provider]  Multiple Vitamins-Minerals (ALIVE  WOMENS 50+) TABS Take 1 tablet by mouth daily.   Yes [provider]  rosuvastatin (CRESTOR) 5 MG tablet Take 5 mg by mouth daily. 05/17/20  Yes [provider]  spironolactone (ALDACTONE) 25 MG tablet Take 25 mg by mouth daily. 05/16/20  Yes [provider]  Vitamin D, Ergocalciferol, (DRISDOL) 1.25 MG (50000 UT) CAPS capsule TAKE ONE CAPSULE BY MOUTH 2 TIMES A WEEK 04/14/18  Yes Shambley, Melody N, CNM  beclomethasone (QVAR) 40 MCG/ACT inhaler Inhale 2 puffs into the lungs 2 (two) times daily. Patient not taking: Reported on 05/19/2020    [provider]  CLARITIN REDITABS 10 MG dissolvable tablet DISSOLVE ONE TABLET BY MOUTH DAILY Patient not taking: Reported on 05/19/2020 06/08/18   Shambley, Melody N, CNM  clobetasol ointment (TEMOVATE) 3.53 % Apply 1 application topically 2 (two) times daily. Apply to affected area Patient not taking: Reported on 05/19/2020 02/18/17   Joylene Igo, CNM    Allergies as of 05/19/2020  . (No Known Allergies)    Family History  Problem Relation Age of Onset  . Cancer Paternal Aunt        ovairan    Social History   Socioeconomic History  . Marital status: Married    Spouse name: Not on file  . Number of children: 3  . Years of education: Not on file  . Highest education level: Not on file  Occupational History  . Occupation: [employer]  Employer: [employer]  Tobacco Use  . Smoking status: Never Smoker  . Smokeless tobacco: Never Used  Vaping Use  . Vaping Use: Never used  Substance and Sexual Activity  . Alcohol use: No  . Drug use: No  . Sexual activity: Yes  Other Topics Concern  . Not on file  Social History Narrative  . Not on file   Social Determinants of Health   Financial Resource Strain: Not on file  Food Insecurity: Not on file  Transportation Needs: Not on file  Physical Activity: Not on file  Stress: Not on file  Social Connections: Not on file   Intimate Partner Violence: Not on file    Review of Systems: See HPI, otherwise negative ROS  Physical Exam: BP 118/76   Pulse 86   Temp (!) 96.8 F (36 C) (Temporal)   Resp 20   Ht 5\' 4"  (1.626 m)   Wt 92.5 kg   LMP 05/27/2016   SpO2 99%   BMI 35.02 kg/m  General:   Alert,  pleasant and cooperative in NAD Head:  Normocephalic and atraumatic. Neck:  Supple; no masses or thyromegaly. Lungs:  Clear throughout to auscultation, normal respiratory effort.    Heart:  +S1, +S2, Regular rate and rhythm, No edema. Abdomen:  Soft, nontender and nondistended. Normal bowel sounds, without guarding, and without rebound.   Neurologic:  Alert and  oriented x4;  grossly normal neurologically.  Impression/Plan: [name] is here for an colonoscopy to be performed for Screening colonoscopy average risk   Risks, benefits, limitations, and alternatives regarding  colonoscopy have been reviewed with the patient.  Questions have been answered.  All parties agreeable.   Erin Bellows, MD  06/16/2020, 7:47 AM

## 2020-06-16 NOTE — Op Note (Signed)
[facility] Patient Name: Erin Stuart Procedure Date: 06/16/2020 7:49 AM MRN: [id number] Account #: [id number] Date of Birth: 08-Jun-1965 Admit Type: Outpatient Age: 55 Room: [facility] ENDO ROOM 4 Gender: Female Note Status: Finalized Procedure:             Colonoscopy Indications:           Screening for colorectal malignant neoplasm Providers:             Jonathon Bellows MD, MD Referring MD:          Precious Bard, MD (Referring MD) Medicines:             Monitored Anesthesia Care Complications:         No immediate complications. Procedure:             Pre-Anesthesia Assessment:                        - Prior to the procedure, a History and Physical was                         performed, and patient medications, allergies and                         sensitivities were reviewed. The patient's tolerance                         of previous anesthesia was reviewed.                        - The risks and benefits of the procedure and the                         sedation options and risks were discussed with the                         patient. All questions were answered and informed                         consent was obtained.                        - ASA Grade Assessment: II - A patient with mild                         systemic disease.                        After obtaining informed consent, the colonoscope was                         passed under direct vision. Throughout the procedure,                         the patient's blood pressure, pulse, and oxygen                         saturations were monitored continuously. The                         Colonoscope was introduced through the anus  and                         advanced to the the cecum, identified by the                         appendiceal orifice. The colonoscopy was performed                         with ease. The patient tolerated the procedure well.                         The  quality of the bowel preparation was good. Findings:      The perianal and digital rectal examinations were normal.      Multiple small and large-mouthed diverticula were found in the entire       colon.      A 10 mm polyp was found in the cecum. The polyp was sessile. The polyp       was removed with a cold snare. Resection and retrieval were complete.      A 5 mm polyp was found in the ascending colon. The polyp was sessile.       The polyp was removed with a cold snare. Resection and retrieval were       complete.      Non-bleeding internal hemorrhoids were found during retroflexion. The       hemorrhoids were medium-sized and Grade I (internal hemorrhoids that do       not prolapse).      The exam was otherwise without abnormality on direct and retroflexion       views. Impression:            - Diverticulosis in the entire examined colon.                        - One 10 mm polyp in the cecum, removed with a cold                         snare. Resected and retrieved.                        - One 5 mm polyp in the ascending colon, removed with                         a cold snare. Resected and retrieved.                        - Non-bleeding internal hemorrhoids.                        - The examination was otherwise normal on direct and                         retroflexion views. Recommendation:        - Discharge patient to home (with escort).                        - Resume previous diet.                        -  Continue present medications.                        - Await pathology results.                        - Repeat colonoscopy for surveillance based on                         pathology results. Procedure Code(s):     --- Professional ---                        [id number], Colonoscopy, flexible; with removal of                         tumor(s), polyp(s), or other lesion(s) by snare                         technique Diagnosis Code(s):     --- Professional ---                         Z12.11, Encounter for screening for malignant neoplasm                         of colon                        K64.0, First degree hemorrhoids                        K63.5, Polyp of colon                        K57.30, Diverticulosis of large intestine without                         perforation or abscess without bleeding CPT copyright 2019 American Medical Association. All rights reserved. The codes documented in this report are preliminary and upon coder review may  be revised to meet current compliance requirements. Jonathon Bellows, MD Jonathon Bellows MD, MD 06/16/2020 8:13:20 AM This report has been signed electronically. Number of Addenda: 0 Note Initiated On: 06/16/2020 7:49 AM Scope Withdrawal Time: 0 hours 10 minutes 51 seconds  Total Procedure Duration: 0 hours 16 minutes 53 seconds  Estimated Blood Loss:  Estimated blood loss: none.      [facility]

## 2020-06-16 NOTE — Anesthesia Preprocedure Evaluation (Signed)
Anesthesia Evaluation  Patient identified by MRN, date of birth, ID band Patient awake    Reviewed: Allergy & Precautions, NPO status , Patient's Chart, lab work & pertinent test results  History of Anesthesia Complications Negative for: history of anesthetic complications  Airway Mallampati: II  TM Distance: >3 FB Neck ROM: Full    Dental no notable dental hx.    Pulmonary neg pulmonary ROS, neg sleep apnea, neg COPD,    breath sounds clear to auscultation- rhonchi (-) wheezing      Cardiovascular (-) hypertension+CHF (NICM)  (-) CAD, (-) Past MI, (-) Cardiac Stents and (-) CABG + pacemaker  Rhythm:Regular Rate:Normal - Systolic murmurs and - Diastolic murmurs Echo 2/62/03: MILD LV SYSTOLIC DYSFUNCTION (See above)  WITH MILD LVH  NORMAL RIGHT VENTRICULAR SYSTOLIC FUNCTION  MILD VALVULAR REGURGITATION  NO VALVULAR STENOSIS  TRIVIAL AR, MR  MILD TR, PR  EF 45%  Closest EF: 45% (Estimated)  Contraction: REGIONALLY IMPAIRED  LVH: MILD LVH  Aortic: TRIVIAL AR  Mitral: TRIVIAL MR  Tricuspid: MILD TR  TRIVIAL AR, MR  MILD TR, PR  EF 45%     Neuro/Psych neg Seizures Anxiety negative neurological ROS     GI/Hepatic negative GI ROS, Neg liver ROS,   Endo/Other  negative endocrine ROSneg diabetes  Renal/GU negative Renal ROS     Musculoskeletal negative musculoskeletal ROS (+)   Abdominal (+) + obese,   Peds  Hematology negative hematology ROS (+)   Anesthesia Other Findings Past Medical History: No date: Anxiety No date: Hyperlipidemia No date: LBBB (left bundle branch block)   Reproductive/Obstetrics                             Anesthesia Physical Anesthesia Plan  ASA: III  Anesthesia Plan: General   Post-op Pain Management:    Induction: Intravenous  PONV Risk Score and Plan: 2 and Propofol infusion  Airway Management Planned: Natural Airway  Additional Equipment:    Intra-op Plan:   Post-operative Plan:   Informed Consent: I have reviewed the patients History and Physical, chart, labs and discussed the procedure including the risks, benefits and alternatives for the proposed anesthesia with the patient or authorized representative who has indicated his/her understanding and acceptance.     Dental advisory given  Plan Discussed with: CRNA and Anesthesiologist  Anesthesia Plan Comments:         Anesthesia Quick Evaluation

## 2020-06-19 ENCOUNTER — Encounter: Payer: Self-pay | Admitting: Gastroenterology

## 2020-06-19 LAB — SURGICAL PATHOLOGY

## 2020-08-04 DIAGNOSIS — I420 Dilated cardiomyopathy: Secondary | ICD-10-CM | POA: Diagnosis not present

## 2020-08-04 DIAGNOSIS — Z0000 Encounter for general adult medical examination without abnormal findings: Secondary | ICD-10-CM | POA: Diagnosis not present

## 2020-08-04 DIAGNOSIS — E669 Obesity, unspecified: Secondary | ICD-10-CM | POA: Diagnosis not present

## 2020-08-04 DIAGNOSIS — E782 Mixed hyperlipidemia: Secondary | ICD-10-CM | POA: Diagnosis not present

## 2020-08-09 ENCOUNTER — Other Ambulatory Visit: Payer: Self-pay | Admitting: Physician Assistant

## 2020-08-09 DIAGNOSIS — E669 Obesity, unspecified: Secondary | ICD-10-CM | POA: Diagnosis not present

## 2020-08-09 DIAGNOSIS — M25561 Pain in right knee: Secondary | ICD-10-CM | POA: Diagnosis not present

## 2020-08-09 DIAGNOSIS — Z1231 Encounter for screening mammogram for malignant neoplasm of breast: Secondary | ICD-10-CM

## 2020-08-09 DIAGNOSIS — I420 Dilated cardiomyopathy: Secondary | ICD-10-CM | POA: Diagnosis not present

## 2020-08-09 DIAGNOSIS — E782 Mixed hyperlipidemia: Secondary | ICD-10-CM | POA: Diagnosis not present

## 2020-08-11 DIAGNOSIS — I420 Dilated cardiomyopathy: Secondary | ICD-10-CM | POA: Diagnosis not present

## 2020-08-11 DIAGNOSIS — I447 Left bundle-branch block, unspecified: Secondary | ICD-10-CM | POA: Diagnosis not present

## 2020-08-11 DIAGNOSIS — I5022 Chronic systolic (congestive) heart failure: Secondary | ICD-10-CM | POA: Diagnosis not present

## 2020-08-11 DIAGNOSIS — I208 Other forms of angina pectoris: Secondary | ICD-10-CM | POA: Diagnosis not present

## 2020-09-25 DIAGNOSIS — R509 Fever, unspecified: Secondary | ICD-10-CM | POA: Diagnosis not present

## 2020-09-25 DIAGNOSIS — R8271 Bacteriuria: Secondary | ICD-10-CM | POA: Diagnosis not present

## 2020-09-25 DIAGNOSIS — R110 Nausea: Secondary | ICD-10-CM | POA: Diagnosis not present

## 2020-09-25 DIAGNOSIS — Z03818 Encounter for observation for suspected exposure to other biological agents ruled out: Secondary | ICD-10-CM | POA: Diagnosis not present

## 2020-11-28 DIAGNOSIS — I5022 Chronic systolic (congestive) heart failure: Secondary | ICD-10-CM | POA: Diagnosis not present

## 2021-02-19 DIAGNOSIS — R7303 Prediabetes: Secondary | ICD-10-CM | POA: Diagnosis not present

## 2021-02-19 DIAGNOSIS — Z23 Encounter for immunization: Secondary | ICD-10-CM | POA: Diagnosis not present

## 2021-02-19 DIAGNOSIS — E782 Mixed hyperlipidemia: Secondary | ICD-10-CM | POA: Diagnosis not present

## 2021-02-19 DIAGNOSIS — R69 Illness, unspecified: Secondary | ICD-10-CM | POA: Diagnosis not present

## 2021-02-27 DIAGNOSIS — I420 Dilated cardiomyopathy: Secondary | ICD-10-CM | POA: Diagnosis not present

## 2021-03-28 ENCOUNTER — Other Ambulatory Visit: Payer: Self-pay

## 2021-03-28 ENCOUNTER — Ambulatory Visit
Admission: RE | Admit: 2021-03-28 | Discharge: 2021-03-28 | Disposition: A | Discharge status: Home / Self Care | Payer: 59 | Source: Ambulatory Visit | Attending: Physician Assistant | Admitting: Physician Assistant

## 2021-03-28 DIAGNOSIS — Z1231 Encounter for screening mammogram for malignant neoplasm of breast: Secondary | ICD-10-CM | POA: Diagnosis not present

## 2021-05-01 DIAGNOSIS — R69 Illness, unspecified: Secondary | ICD-10-CM | POA: Diagnosis not present

## 2021-05-17 DIAGNOSIS — I447 Left bundle-branch block, unspecified: Secondary | ICD-10-CM | POA: Diagnosis not present

## 2021-05-17 DIAGNOSIS — I208 Other forms of angina pectoris: Secondary | ICD-10-CM | POA: Diagnosis not present

## 2021-05-17 DIAGNOSIS — I5022 Chronic systolic (congestive) heart failure: Secondary | ICD-10-CM | POA: Diagnosis not present

## 2021-05-17 DIAGNOSIS — I420 Dilated cardiomyopathy: Secondary | ICD-10-CM | POA: Diagnosis not present

## 2021-05-22 DIAGNOSIS — I77810 Thoracic aortic ectasia: Secondary | ICD-10-CM

## 2021-05-22 DIAGNOSIS — I420 Dilated cardiomyopathy: Secondary | ICD-10-CM | POA: Diagnosis not present

## 2021-05-22 DIAGNOSIS — I77819 Aortic ectasia, unspecified site: Secondary | ICD-10-CM

## 2021-05-22 HISTORY — DX: Thoracic aortic ectasia: I77.810

## 2021-05-22 HISTORY — DX: Aortic ectasia, unspecified site: I77.819

## 2021-06-12 DIAGNOSIS — I420 Dilated cardiomyopathy: Secondary | ICD-10-CM | POA: Diagnosis not present

## 2021-06-12 DIAGNOSIS — R7303 Prediabetes: Secondary | ICD-10-CM | POA: Diagnosis not present

## 2021-06-12 DIAGNOSIS — E782 Mixed hyperlipidemia: Secondary | ICD-10-CM | POA: Diagnosis not present

## 2021-06-12 DIAGNOSIS — E669 Obesity, unspecified: Secondary | ICD-10-CM | POA: Diagnosis not present

## 2021-07-17 DIAGNOSIS — M170 Bilateral primary osteoarthritis of knee: Secondary | ICD-10-CM | POA: Diagnosis not present

## 2021-07-17 DIAGNOSIS — R350 Frequency of micturition: Secondary | ICD-10-CM | POA: Diagnosis not present

## 2021-07-17 DIAGNOSIS — G8929 Other chronic pain: Secondary | ICD-10-CM | POA: Diagnosis not present

## 2021-07-17 DIAGNOSIS — M25562 Pain in left knee: Secondary | ICD-10-CM | POA: Diagnosis not present

## 2021-07-17 DIAGNOSIS — M25561 Pain in right knee: Secondary | ICD-10-CM | POA: Diagnosis not present

## 2021-07-25 DIAGNOSIS — I5022 Chronic systolic (congestive) heart failure: Secondary | ICD-10-CM | POA: Diagnosis not present

## 2021-07-31 DIAGNOSIS — Z0000 Encounter for general adult medical examination without abnormal findings: Secondary | ICD-10-CM | POA: Diagnosis not present

## 2021-07-31 DIAGNOSIS — E349 Endocrine disorder, unspecified: Secondary | ICD-10-CM | POA: Diagnosis not present

## 2021-07-31 DIAGNOSIS — I420 Dilated cardiomyopathy: Secondary | ICD-10-CM | POA: Diagnosis not present

## 2021-07-31 DIAGNOSIS — E669 Obesity, unspecified: Secondary | ICD-10-CM | POA: Diagnosis not present

## 2021-07-31 DIAGNOSIS — R87610 Atypical squamous cells of undetermined significance on cytologic smear of cervix (ASC-US): Secondary | ICD-10-CM | POA: Diagnosis not present

## 2021-07-31 DIAGNOSIS — R69 Illness, unspecified: Secondary | ICD-10-CM | POA: Diagnosis not present

## 2021-07-31 DIAGNOSIS — E782 Mixed hyperlipidemia: Secondary | ICD-10-CM | POA: Diagnosis not present

## 2021-07-31 DIAGNOSIS — Z1231 Encounter for screening mammogram for malignant neoplasm of breast: Secondary | ICD-10-CM | POA: Diagnosis not present

## 2021-07-31 DIAGNOSIS — R7303 Prediabetes: Secondary | ICD-10-CM | POA: Diagnosis not present

## 2021-07-31 DIAGNOSIS — Z124 Encounter for screening for malignant neoplasm of cervix: Secondary | ICD-10-CM | POA: Diagnosis not present

## 2021-09-25 DIAGNOSIS — I420 Dilated cardiomyopathy: Secondary | ICD-10-CM | POA: Diagnosis not present

## 2021-09-27 DIAGNOSIS — M1712 Unilateral primary osteoarthritis, left knee: Secondary | ICD-10-CM | POA: Diagnosis not present

## 2021-09-27 DIAGNOSIS — R69 Illness, unspecified: Secondary | ICD-10-CM | POA: Diagnosis not present

## 2021-09-27 DIAGNOSIS — M1711 Unilateral primary osteoarthritis, right knee: Secondary | ICD-10-CM | POA: Diagnosis not present

## 2021-09-29 DIAGNOSIS — M1711 Unilateral primary osteoarthritis, right knee: Secondary | ICD-10-CM | POA: Insufficient documentation

## 2021-09-29 DIAGNOSIS — R7303 Prediabetes: Secondary | ICD-10-CM | POA: Insufficient documentation

## 2021-09-29 DIAGNOSIS — M1712 Unilateral primary osteoarthritis, left knee: Secondary | ICD-10-CM | POA: Insufficient documentation

## 2021-10-08 DIAGNOSIS — R69 Illness, unspecified: Secondary | ICD-10-CM | POA: Diagnosis not present

## 2021-10-18 DIAGNOSIS — E213 Hyperparathyroidism, unspecified: Secondary | ICD-10-CM | POA: Diagnosis not present

## 2021-11-08 DIAGNOSIS — R69 Illness, unspecified: Secondary | ICD-10-CM | POA: Diagnosis not present

## 2021-11-09 NOTE — Discharge Instructions (Signed)
Instructions after Total Knee Replacement   Zamaya Rapaport P. Illianna Paschal, Jr., M.D.     Dept. of Orthopaedics & Sports Medicine  [facility]  [address]  [place], [place]  [address]  Phone: [phone]   Fax: [phone]    DIET: Drink plenty of non-alcoholic fluids. Resume your normal diet. Include foods high in fiber.  ACTIVITY:  You may use crutches or a walker with weight-bearing as tolerated, unless instructed otherwise. You may be weaned off of the walker or crutches by your Physical Therapist.  Do NOT place pillows under the knee. Anything placed under the knee could limit your ability to straighten the knee.   Continue doing gentle exercises. Exercising will reduce the pain and swelling, increase motion, and prevent muscle weakness.   Please continue to use the TED compression stockings for 6 weeks. You may remove the stockings at night, but should reapply them in the morning. Do not drive or operate any equipment until instructed.  WOUND CARE:  Continue to use the PolarCare or ice packs periodically to reduce pain and swelling. You may bathe or shower after the staples are removed at the first office visit following surgery.  MEDICATIONS: You may resume your regular medications. Please take the pain medication as prescribed on the medication. Do not take pain medication on an empty stomach. You have been given a prescription for a blood thinner (Lovenox or Coumadin). Please take the medication as instructed. (NOTE: After completing a 2 week course of Lovenox, take one Enteric-coated aspirin once a day. This along with elevation will help reduce the possibility of phlebitis in your operated leg.) Do not drive or drink alcoholic beverages when taking pain medications.  CALL THE OFFICE FOR: Temperature above 101 degrees Excessive bleeding or drainage on the dressing. Excessive swelling, coldness, or paleness of the toes. Persistent nausea and vomiting.  FOLLOW-UP:  You  should have an appointment to return to the office in 10-14 days after surgery. Arrangements have been made for continuation of Physical Therapy (either home therapy or outpatient therapy).   [facility] Department Directory         [email or web address]       [email or web address]          Cardiology  Appointments: [place] - [phone] [place] - [phone]  Endocrinology  Appointments: [place] - [phone] [place] - [phone]  Gastroenterology  Appointments: [place] - [phone] [place] - [phone]        General Surgery   Appointments: [place] - [phone]  Internal Medicine/Family Medicine  Appointments: [place] - [phone] [place] - [phone] [place] - [phone]  Metabolic and Weigh Loss Surgery  Appointments: [place] - [phone]        Neurology  Appointments: [place] - [phone] [place] - [phone]  Neurosurgery  Appointments: [place] - [phone]  Obstetrics & Gynecology  Appointments: [place] - [phone] [place] - [phone]        Pediatrics  Appointments: [place] - [phone] [place] - [phone]  Physiatry  Appointments: [place] -[phone]  Physical Therapy  Appointments: [place] - [phone] [place] - [phone]        Podiatry  Appointments: [place] - [phone] [place] - [phone]  Pulmonology  Appointments: [place] - [phone]  Rheumatology  Appointments: [place] - [phone]        [place] Location: [facility]  [address] [place], [place]  [address]  [place] Location: [facility] [address] [place], [place]  [address]  [place] Location: [facility] [address] [place], [place]  [address]    

## 2021-11-15 ENCOUNTER — Encounter: Payer: Self-pay | Admitting: Urgent Care

## 2021-11-15 ENCOUNTER — Other Ambulatory Visit: Payer: Self-pay

## 2021-11-15 ENCOUNTER — Encounter
Admission: RE | Admit: 2021-11-15 | Discharge: 2021-11-15 | Disposition: A | Discharge status: Home / Self Care | Payer: 59 | Source: Ambulatory Visit | Attending: Orthopedic Surgery | Admitting: Orthopedic Surgery

## 2021-11-15 VITALS — BP 115/84 | HR 65 | Resp 16 | Ht 64.0 in | Wt 205.2 lb

## 2021-11-15 DIAGNOSIS — Z01818 Encounter for other preprocedural examination: Secondary | ICD-10-CM | POA: Diagnosis present

## 2021-11-15 DIAGNOSIS — Z01810 Encounter for preprocedural cardiovascular examination: Secondary | ICD-10-CM | POA: Diagnosis not present

## 2021-11-15 DIAGNOSIS — E785 Hyperlipidemia, unspecified: Secondary | ICD-10-CM | POA: Diagnosis not present

## 2021-11-15 DIAGNOSIS — M1711 Unilateral primary osteoarthritis, right knee: Secondary | ICD-10-CM | POA: Diagnosis not present

## 2021-11-15 DIAGNOSIS — I447 Left bundle-branch block, unspecified: Secondary | ICD-10-CM | POA: Insufficient documentation

## 2021-11-15 DIAGNOSIS — R8290 Unspecified abnormal findings in urine: Secondary | ICD-10-CM | POA: Diagnosis not present

## 2021-11-15 DIAGNOSIS — Z01812 Encounter for preprocedural laboratory examination: Secondary | ICD-10-CM

## 2021-11-15 HISTORY — DX: Vitamin D deficiency, unspecified: E55.9

## 2021-11-15 HISTORY — DX: Dilated cardiomyopathy: I42.0

## 2021-11-15 HISTORY — DX: Angina pectoris, unspecified: I20.9

## 2021-11-15 HISTORY — DX: Heart failure, unspecified: I50.9

## 2021-11-15 HISTORY — DX: Personal history of urinary calculi: Z87.442

## 2021-11-15 HISTORY — DX: Prediabetes: R73.03

## 2021-11-15 HISTORY — DX: Unspecified osteoarthritis, unspecified site: M19.90

## 2021-11-15 HISTORY — DX: Depression, unspecified: F32.A

## 2021-11-15 HISTORY — DX: Unspecified asthma, uncomplicated: J45.909

## 2021-11-15 HISTORY — DX: Other specified postprocedural states: R11.2

## 2021-11-15 HISTORY — DX: Other specified postprocedural states: Z98.890

## 2021-11-15 LAB — URINALYSIS, ROUTINE W REFLEX MICROSCOPIC
Bilirubin Urine: NEGATIVE
Glucose, UA: NEGATIVE mg/dL
Ketones, ur: NEGATIVE mg/dL
Nitrite: NEGATIVE
Protein, ur: NEGATIVE mg/dL
Specific Gravity, Urine: 1.006 (ref 1.005–1.030)
pH: 5 (ref 5.0–8.0)

## 2021-11-15 LAB — COMPREHENSIVE METABOLIC PANEL
ALT: 26 U/L (ref 0–44)
AST: 28 U/L (ref 15–41)
Albumin: 5 g/dL (ref 3.5–5.0)
Alkaline Phosphatase: 83 U/L (ref 38–126)
Anion gap: 12 (ref 5–15)
BUN: 16 mg/dL (ref 6–20)
CO2: 25 mmol/L (ref 22–32)
Calcium: 11.6 mg/dL — ABNORMAL HIGH (ref 8.9–10.3)
Chloride: 103 mmol/L (ref 98–111)
Creatinine, Ser: 0.87 mg/dL (ref 0.44–1.00)
GFR, Estimated: >60 mL/min (ref >60)
Glucose, Bld: 113 mg/dL — ABNORMAL HIGH (ref 70–99)
Potassium: 3.7 mmol/L (ref 3.5–5.1)
Sodium: 140 mmol/L (ref 135–145)
Total Bilirubin: 1.1 mg/dL (ref 0.3–1.2)
Total Protein: 8.5 g/dL — ABNORMAL HIGH (ref 6.5–8.1)

## 2021-11-15 LAB — TYPE AND SCREEN
ABO/RH(D): O POS
Antibody Screen: NEGATIVE

## 2021-11-15 LAB — CBC
HCT: 44 % (ref 36.0–46.0)
Hemoglobin: 15.2 g/dL — ABNORMAL HIGH (ref 12.0–15.0)
MCH: 29.7 pg (ref 26.0–34.0)
MCHC: 34.5 g/dL (ref 30.0–36.0)
MCV: 85.9 fL (ref 80.0–100.0)
Platelets: 235 10*3/uL (ref 150–400)
RBC: 5.12 MIL/uL — ABNORMAL HIGH (ref 3.87–5.11)
RDW: 11.9 % (ref 11.5–15.5)
WBC: 7.4 10*3/uL (ref 4.0–10.5)
nRBC: 0 % (ref 0.0–0.2)

## 2021-11-15 LAB — SURGICAL PCR SCREEN
MRSA, PCR: NEGATIVE
Staphylococcus aureus: NEGATIVE

## 2021-11-15 LAB — C-REACTIVE PROTEIN: CRP: 1.1 mg/dL — ABNORMAL HIGH (ref <1.0)

## 2021-11-15 LAB — SEDIMENTATION RATE: Sed Rate: 12 mm/hr (ref 0–30)

## 2021-11-15 NOTE — Patient Instructions (Addendum)
Your procedure is scheduled on: 11/26/21 - Monday Report to the Registration Desk on the 1st floor of the [facility]. To find out your arrival time, please call [phone] between 1PM - 3PM on: 11/23/21 - Friday If your arrival time is 6:00 am, do not arrive prior to that time as the [facility] entrance doors do not open until 6:00 am.  REMEMBER: Instructions that are not followed completely may result in serious medical risk, up to and including death; or upon the discretion of your surgeon and anesthesiologist your surgery may need to be rescheduled.  Do not eat food after midnight the night before surgery.  No gum chewing, lozengers or hard candies.  You may however, drink CLEAR liquids up to 2 hours before you are scheduled to arrive for your surgery. Do not drink anything within 2 hours of your scheduled arrival time.  Clear liquids include: - water  - apple juice without pulp - gatorade (not RED colors) - black coffee or tea (Do NOT add milk or creamers to the coffee or tea) Do NOT drink anything that is not on this list.  In addition, your doctor has ordered for you to drink the provided  Ensure Pre-Surgery Clear Carbohydrate Drink  Drinking this carbohydrate drink up to two hours before surgery helps to reduce insulin resistance and improve patient outcomes. Please complete drinking 2 hours prior to scheduled arrival time.  TAKE THESE MEDICATIONS THE MORNING OF SURGERY WITH A SIP OF WATER:  - carvedilol (COREG)  - buPROPion (WELLBUTRIN XL)  One week prior to surgery: Stop Anti-inflammatories (NSAIDS) such as Advil, Aleve, Ibuprofen, Motrin, Naproxen, Naprosyn and Aspirin based products such as Excedrin, Goodys Powder, BC Powder.  Stop ANY OVER THE COUNTER supplements until after surgery.  You may however, continue to take Tylenol if needed for pain up until the day of surgery.  No Alcohol for 24 hours before or after surgery.  No Smoking including e-cigarettes  for 24 hours prior to surgery.  No chewable tobacco products for at least 6 hours prior to surgery.  No nicotine patches on the day of surgery.  Do not use any "recreational" drugs for at least a week prior to your surgery.  Please be advised that the combination of cocaine and anesthesia may have negative outcomes, up to and including death. If you test positive for cocaine, your surgery will be cancelled.  On the morning of surgery brush your teeth with toothpaste and water, you may rinse your mouth with mouthwash if you wish. Do not swallow any toothpaste or mouthwash.  Use CHG Soap or wipes as directed on instruction sheet.  Do not wear jewelry, make-up, hairpins, clips or nail polish.  Do not wear lotions, powders, or perfumes.   Do not shave body from the neck down 48 hours prior to surgery just in case you cut yourself which could leave a site for infection.  Also, freshly shaved skin may become irritated if using the CHG soap.  Contact lenses, hearing aids and dentures may not be worn into surgery.  Do not bring valuables to the hospital. [facility] is not responsible for any missing/lost belongings or valuables.   Notify your doctor if there is any change in your medical condition (cold, fever, infection).  Wear comfortable clothing (specific to your surgery type) to the hospital.  After surgery, you can help prevent lung complications by doing breathing exercises.  Take deep breaths and cough every 1-2 hours. Your doctor may order  a device called an Incentive Spirometer to help you take deep breaths. When coughing or sneezing, hold a pillow firmly against your incision with both hands. This is called "splinting." Doing this helps protect your incision. It also decreases belly discomfort.  If you are being admitted to the hospital overnight, leave your suitcase in the car. After surgery it may be brought to your room.  If you are being discharged the day of surgery, you  will not be allowed to drive home. You will need a responsible adult (18 years or older) to drive you home and stay with you that night.   If you are taking public transportation, you will need to have a responsible adult (18 years or older) with you. Please confirm with your physician that it is acceptable to use public transportation.   Please call the [facility] Dept. at [phone] if you have any questions about these instructions.  Surgery Visitation Policy:  Patients undergoing a surgery or procedure may have two family members or support persons with them as long as the person is not COVID-19 positive or experiencing its symptoms.   Inpatient Visitation:    Visiting hours are 7 a.m. to 8 p.m. Up to four visitors are allowed at one time in a patient room, including children. The visitors may rotate out with other people during the day. One designated support person (adult) may remain overnight.

## 2021-11-18 LAB — URINE CULTURE: Culture: >=100000 — AB

## 2021-11-20 ENCOUNTER — Emergency Department: Payer: 59

## 2021-11-20 ENCOUNTER — Encounter: Payer: Self-pay | Admitting: Orthopedic Surgery

## 2021-11-20 ENCOUNTER — Other Ambulatory Visit: Payer: Self-pay

## 2021-11-20 ENCOUNTER — Other Ambulatory Visit: Payer: Self-pay | Admitting: *Deleted

## 2021-11-20 ENCOUNTER — Emergency Department
Admission: EM | Admit: 2021-11-20 | Discharge: 2021-11-20 | Disposition: A | Discharge status: Home / Self Care | Payer: 59 | Attending: Emergency Medicine | Admitting: Emergency Medicine

## 2021-11-20 DIAGNOSIS — K828 Other specified diseases of gallbladder: Secondary | ICD-10-CM | POA: Diagnosis not present

## 2021-11-20 DIAGNOSIS — I509 Heart failure, unspecified: Secondary | ICD-10-CM | POA: Diagnosis not present

## 2021-11-20 DIAGNOSIS — N132 Hydronephrosis with renal and ureteral calculous obstruction: Secondary | ICD-10-CM | POA: Diagnosis not present

## 2021-11-20 DIAGNOSIS — R1032 Left lower quadrant pain: Secondary | ICD-10-CM | POA: Diagnosis present

## 2021-11-20 DIAGNOSIS — K5730 Diverticulosis of large intestine without perforation or abscess without bleeding: Secondary | ICD-10-CM | POA: Diagnosis not present

## 2021-11-20 DIAGNOSIS — N200 Calculus of kidney: Secondary | ICD-10-CM | POA: Insufficient documentation

## 2021-11-20 DIAGNOSIS — K429 Umbilical hernia without obstruction or gangrene: Secondary | ICD-10-CM | POA: Diagnosis not present

## 2021-11-20 DIAGNOSIS — M1711 Unilateral primary osteoarthritis, right knee: Secondary | ICD-10-CM | POA: Diagnosis not present

## 2021-11-20 LAB — CBC WITH DIFFERENTIAL/PLATELET
Abs Immature Granulocytes: 0.05 10*3/uL (ref 0.00–0.07)
Basophils Absolute: 0 10*3/uL (ref 0.0–0.1)
Basophils Relative: 0 %
Eosinophils Absolute: 0 10*3/uL (ref 0.0–0.5)
Eosinophils Relative: 0 %
HCT: 44.1 % (ref 36.0–46.0)
Hemoglobin: 15.2 g/dL — ABNORMAL HIGH (ref 12.0–15.0)
Immature Granulocytes: 1 %
Lymphocytes Relative: 6 %
Lymphs Abs: 0.4 10*3/uL — ABNORMAL LOW (ref 0.7–4.0)
MCH: 29.6 pg (ref 26.0–34.0)
MCHC: 34.5 g/dL (ref 30.0–36.0)
MCV: 85.8 fL (ref 80.0–100.0)
Monocytes Absolute: 0.1 10*3/uL (ref 0.1–1.0)
Monocytes Relative: 1 %
Neutro Abs: 6.5 10*3/uL (ref 1.7–7.7)
Neutrophils Relative %: 92 %
Platelets: 162 10*3/uL (ref 150–400)
RBC: 5.14 MIL/uL — ABNORMAL HIGH (ref 3.87–5.11)
RDW: 11.9 % (ref 11.5–15.5)
WBC: 7 10*3/uL (ref 4.0–10.5)
nRBC: 0 % (ref 0.0–0.2)

## 2021-11-20 LAB — URINALYSIS, ROUTINE W REFLEX MICROSCOPIC
Bilirubin Urine: NEGATIVE
Glucose, UA: NEGATIVE mg/dL
Ketones, ur: NEGATIVE mg/dL
Leukocytes,Ua: NEGATIVE
Nitrite: NEGATIVE
Protein, ur: NEGATIVE mg/dL
Specific Gravity, Urine: 1.008 (ref 1.005–1.030)
pH: 5 (ref 5.0–8.0)

## 2021-11-20 LAB — BASIC METABOLIC PANEL
Anion gap: 14 (ref 5–15)
BUN: 21 mg/dL — ABNORMAL HIGH (ref 6–20)
CO2: 16 mmol/L — ABNORMAL LOW (ref 22–32)
Calcium: 11 mg/dL — ABNORMAL HIGH (ref 8.9–10.3)
Chloride: 108 mmol/L (ref 98–111)
Creatinine, Ser: 1.19 mg/dL — ABNORMAL HIGH (ref 0.44–1.00)
GFR, Estimated: 54 mL/min — ABNORMAL LOW (ref >60)
Glucose, Bld: 123 mg/dL — ABNORMAL HIGH (ref 70–99)
Potassium: 3.9 mmol/L (ref 3.5–5.1)
Sodium: 138 mmol/L (ref 135–145)

## 2021-11-20 MED ORDER — MORPHINE SULFATE (PF) 4 MG/ML IV SOLN
4.0000 mg | Freq: Once | INTRAVENOUS | Status: AC
  Administered 2021-11-20: 4 mg via INTRAVENOUS
  Filled 2021-11-20: qty 1

## 2021-11-20 MED ORDER — ONDANSETRON HCL 4 MG/2ML IJ SOLN
4.0000 mg | Freq: Once | INTRAMUSCULAR | Status: AC
  Administered 2021-11-20: 4 mg via INTRAVENOUS
  Filled 2021-11-20: qty 2

## 2021-11-20 MED ORDER — METOCLOPRAMIDE HCL 5 MG/ML IJ SOLN
10.0000 mg | Freq: Once | INTRAMUSCULAR | Status: AC
  Administered 2021-11-20: 10 mg via INTRAVENOUS
  Filled 2021-11-20: qty 2

## 2021-11-20 MED ORDER — KETOROLAC TROMETHAMINE 30 MG/ML IJ SOLN
15.0000 mg | Freq: Once | INTRAMUSCULAR | Status: AC
  Administered 2021-11-20: 15 mg via INTRAVENOUS
  Filled 2021-11-20: qty 1

## 2021-11-20 MED ORDER — ONDANSETRON 4 MG PO TBDP: 4.0000 mg | ORAL_TABLET | Freq: Four times a day (QID) | ORAL | 0 refills | Status: DC | PRN

## 2021-11-20 MED ORDER — SODIUM CHLORIDE 0.9 % IV BOLUS
1000.0000 mL | Freq: Once | INTRAVENOUS | Status: AC
  Administered 2021-11-20: 1000 mL via INTRAVENOUS

## 2021-11-20 MED ORDER — ONDANSETRON 4 MG PO TBDP
4.0000 mg | ORAL_TABLET | Freq: Once | ORAL | Status: AC
  Administered 2021-11-20: 4 mg via ORAL
  Filled 2021-11-20: qty 1

## 2021-11-20 MED ORDER — SODIUM CHLORIDE 0.9 % IV BOLUS
500.0000 mL | Freq: Once | INTRAVENOUS | Status: AC
  Administered 2021-11-20: 500 mL via INTRAVENOUS

## 2021-11-20 NOTE — Progress Notes (Signed)
Perioperative Services  Pre-Admission/Anesthesia Testing Clinical Review  Date: 11/22/21  Patient Demographics:  Name: Erin Stuart DOB:   01-Jun-1965 MRN:   [id number]  Planned Surgical Procedure(s):    Case: [id number] Date/Time: 11/26/21 1120   Procedure: COMPUTER ASSISTED TOTAL KNEE ARTHROPLASTY (Right: Knee)   Anesthesia type: Choice   Pre-op diagnosis: PRIMARY OSTEOARTHRITIS OF RIGHT KNEE.   Location: [facility] OR ROOM 01 / [facility] FOR ANESTHESIA GROUP   Surgeons: Dereck Leep, MD   NOTE: Available PAT nursing documentation and vital signs have been reviewed. Clinical nursing staff has updated patient's PMH/PSHx, current medication list, and drug allergies/intolerances to ensure comprehensive history available to assist in medical decision making as it pertains to the aforementioned surgical procedure and anticipated anesthetic course. Extensive review of available clinical information performed. [place] PMH and PSHx updated with any diagnoses/procedures that  may have been inadvertently omitted during her intake with the pre-admission testing department's nursing staff.  Clinical Discussion:  Erin Stuart is a 56 y.o. female who is submitted for pre-surgical anesthesia review and clearance prior to her undergoing the above procedure. Patient has never been a smoker. Pertinent PMH includes: CAD, DCM (s/p CRT-D placement), CHF, LBBB, ascending aortic dilatation, pulmonary hypertension, HLD, asthma, OA, anxiety (on BZO), depression.   Patient is followed by cardiology Erin Glassing, MD). She was last seen in the cardiology clinic on 05/17/2021; notes reviewed.  At the time of her clinic visit, patient doing well overall from a cardiovascular perspective she denied any episodes of chest pain, shortness breath, PND, orthopnea, palpitations, significant peripheral edema, vertiginous symptoms, or presyncope/syncope.  Patient with situational stress related to the death of her mother and  illness and her husband.  Patient complaining of pain to her chest wall where her pacemaker was implanted.  Patient with past medical history significant for cardiovascular diagnoses.  Patient diagnosed with a dilated cardiomyopathy in approximately 2018.  TTE performed on 08/13/2016 revealed a severely reduced left ventricular systolic function with an EF of 15%.  There was global hypokinesis with septal and apical akinesis.  Mild LVH noted.  Left atrium moderately enlarged.  Was mild to moderate pan valvular regurgitation.  It was no evidence of a significant transvalvular gradient to suggest stenosis.  Mild pulmonary hypertension noted; RVSP 41.2 mmHg.  Myocardial perfusion imaging study performed on 07/15/2016 revealed a severely reduced left ventricular systolic function with an EF of 21%.  There was global left ventricular hypokinesis.  Left ventricular cavity size was enlarged.  There was a moderate perfusion abnormality in the inferoseptal and inferoapical regions on stress images consistent with possible ischemia.  Cardiac MRI performed on 09/13/2016 revealed a severely reduced left ventricular systolic function with an EF of 22%.  Left ventricle moderately enlarged.  Left atrium severely enlarged and right atrium mildly enlarged.  There was moderate mitral valve regurgitation.  There was no evidence of LGE to suggest infarction, scar, or infiltrative process.  Diagnostic right/left heart catheterization performed on 08/27/2016 revealing a severely reduced left ventricular systolic function with an EF of <25%.  There was global hypokinesis noted.  LVEDP elevated at 23 mmHg.  There was no evidence of aortic or mitral valve stenosis.  Mean PA pressure 30 mmHg, mean PCWP 23 mmHg, PA saturation 65%, AO saturation 97%.  Cardiac output 3.09 L/min.  Cardiac index 1.62 L/min/m.  Patient underwent implantation of a CRT-D device on 12/03/2016.  Device is regularly interrogated by her primary cardiology  team.  Most recent TTE was  performed on 05/22/2021 revealing an improved left ventricular systolic function with mild LVH; LVEF 45%.  There was global hypokinesis. Diastolic Doppler parameters consistent with abnormal relaxation (G1DD).  Trivial to mild pan valvular regurgitation noted.  Incidental finding made of thoracic aortic dilatation; ascending aorta measured up to 3.8 cm and aortic root measured up to 3.5 cm.  Blood pressure well controlled at 122/80 on currently prescribed beta-blocker (carvedilol), diuretic (furosemide + spironolactone), and ARB (losartan) therapies.  Patient is on rosuvastatin for her HLD diagnosis and ASCVD prevention.  She is not diabetic.  Patient does not have an OSAH diagnosis.  With improvement of her EF following CRT-D device placement, patient able to be more active. Functional capacity, as defined by DASI, is documented as being >/= 4 METS.  No changes were made to her medication regimen.  Patient follow-up with outpatient cardiology 6 months or sooner if needed.  Erin Stuart is scheduled for an elective COMPUTER ASSISTED RIGHT TOTAL KNEE ARTHROPLASTY on 11/26/2021 with Dr. Skip Estimable, MD.  Given patient's past medical history significant for cardiovascular diagnoses, presurgical cardiac clearance was sought by the PAT team. Per cardiology, "this patient is optimized for surgery and may proceed with the planned procedural course with a LOW/ACCEPTABLE risk of significant perioperative cardiovascular complications". In review of her medication reconciliation, it is noted the patient is on daily antiplatelet therapy. She has been instructed on recommendations from her cardiologist for holding her daily low-dose ASA for 5 days prior to her procedure with plans to restart as soon as postoperative bleeding risk felt to be minimized by her primary attending surgeon. The patient is aware that her last dose of ASA should be on 11/20/2021.  Patient reports previous  perioperative complications with anesthesia in the past. Patient has a PMH (+) for PONV.  She reports a single remote episode.  Symptoms and history of PONV will be discussed with patient by anesthesia team on the day of her procedure. Interventions will be ordered as deemed necessary based on patient's individual care needs as determined by anesthesiologist. In review of the available records, it is noted that patient underwent a general anesthetic course here at [facility] in 06/2020 without documented complications.      11/21/2021   10:00 AM 11/20/2021   10:40 PM 11/20/2021   10:09 PM  Vitals with BMI  Height '5\' 4"'$     Weight 205 lbs    BMI 32.95    Systolic 188 98 91  Diastolic 68 67 72  Pulse 83 78 74    Providers/Specialists:   NOTE: Primary physician provider listed below. Patient may have been seen by APP or partner within same practice.   PROVIDER ROLE / SPECIALTY LAST OV  Hooten, Laurice Record, MD Orthopedics (Surgeon) 11/20/2021  Marinda Elk, MD Primary Care Provider 07/31/2021  Bartholome Bill, MD Cardiology 05/17/2021  Lavone Orn, MD Endocrinology 10/18/2021   Allergies:  Patient has no known allergies.  Current Home Medications:   No current facility-administered medications for this encounter.    ALPRAZolam (XANAX) 1 MG tablet   Ascorbic Acid (VITAMIN C) 100 MG tablet   aspirin 81 MG tablet   beclomethasone (QVAR) 40 MCG/ACT inhaler   buPROPion (WELLBUTRIN XL) 150 MG 24 hr tablet   carvedilol (COREG) 6.25 MG tablet   Cholecalciferol (VITAMIN D3) 125 MCG (5000 UT) CAPS   furosemide (LASIX) 20 MG tablet   ibuprofen (ADVIL,MOTRIN) 200 MG tablet   losartan (COZAAR) 25  MG tablet   Multiple Vitamins-Minerals (ALIVE WOMENS 50+) TABS   ondansetron (ZOFRAN-ODT) 4 MG disintegrating tablet   rosuvastatin (CRESTOR) 5 MG tablet   spironolactone (ALDACTONE) 25 MG tablet   sulfamethoxazole-trimethoprim (BACTRIM DS) 800-160 MG  tablet   tamsulosin (FLOMAX) 0.4 MG CAPS capsule   History:   Past Medical History:  Diagnosis Date   Acquired dilation of ascending aorta and aortic root ([place]) 05/22/2021   a.) TTE 05/22/2021: Ao root 3.8 cm; asc Ao 3.5 cm   Anginal pain ([facility])    Anxiety    a.) on BZO (alprazolam) PRN   Arthritis    Asthma    CHF (congestive heart failure) ([place])    Depression    Dilated cardiomyopathy ([place])    a.) R/LHC 09/02/2016: EF <25%; b.) TTE 08/13/2016: EF <15%; c.) MPI 08/14/2016: EF 21%; d.) cMRI 09/12/2016: EF 22%, no LGE; e.) s/p CRT-D placement 11/27/2016; f.) TTE 01/09/2017: EF 25%; g.) TTE 09/11/2017: EF 25-30%; h.) TTE 10/09/2018: EF 35%; i.) TTE 09/10/2019: EF 45%; j.) TTE 08/04/2020: EF 45%; k.) TTE 05/22/2021: EF 45%   History of kidney stones    Hyperlipidemia    LBBB (left bundle branch block) 2012   PONV (postoperative nausea and vomiting)    a.) single remote episode   Pre-diabetes    Presence of cardiac resynchronization therapy defibrillator (CRT-D) 11/27/2016   Pulmonary HTN ([place]) 08/13/2016   a.) TTE 08/13/2017: RVSP 41.2; b.) TTE 01/09/2017: RVSP 36.9; c.) TTE 09/11/2017: RVSP 30.4   Vitamin D deficiency    Past Surgical History:  Procedure Laterality Date   BI-VENTRICULAR IMPLANTABLE CARDIOVERTER DEFIBRILLATOR  (CRT-D) N/A 11/27/2016   Procedure: BI-VENTRICULAR IMPLANTABLE CARDIOVERTER DEFIBRILLATOR  (CRT-D); Location: [facility]; Surgeon: Mylinda Latina, MD   COLONOSCOPY WITH PROPOFOL N/A 06/16/2020   Procedure: COLONOSCOPY WITH PROPOFOL;  Surgeon: Jonathon Bellows, MD;  Location: [facility] ENDOSCOPY;  Service: Gastroenterology;  Laterality: N/A;   RIGHT/LEFT HEART CATH AND CORONARY ANGIOGRAPHY Bilateral 08/27/2016   Procedure: Right/Left Heart Cath and Coronary Angiography;  Surgeon: Teodoro Spray, MD;  Location: [facility];  Service: Cardiovascular;  Laterality: Bilateral;   WISDOM TOOTH EXTRACTION     Family History  Problem Relation Age of Onset   Cancer Paternal  Aunt        ovairan   Breast cancer Neg Hx    Social History   Tobacco Use   Smoking status: Never   Smokeless tobacco: Never  Vaping Use   Vaping Use: Never used  Substance Use Topics   Alcohol use: No   Drug use: No    Pertinent Clinical Results:  LABS: Labs reviewed: Acceptable for surgery.  Office Visit on 11/21/2021  Component Date Value Ref Range Status   Specific Gravity, UA 11/21/2021 1.015  1.005 - 1.030 Final   pH, UA 11/21/2021 5.0  5.0 - 7.5 Final   Color, UA 11/21/2021 Yellow  Yellow Final   Appearance Ur 11/21/2021 Hazy (A)  Clear Final   Leukocytes,UA 11/21/2021 1+ (A)  Negative Final   Protein,UA 11/21/2021 1+ (A)  Negative/Trace Final   Glucose, UA 11/21/2021 Negative  Negative Final   Ketones, UA 11/21/2021 Negative  Negative Final   RBC, UA 11/21/2021 1+ (A)  Negative Final   Bilirubin, UA 11/21/2021 Negative  Negative Final   Urobilinogen, Ur 11/21/2021 0.2  0.2 - 1.0 mg/dL Final   Nitrite, UA 11/21/2021 Negative  Negative Final   Microscopic Examination 11/21/2021 See below:   Final   WBC, UA 11/21/2021 >30 (  A)  0 - 5 /hpf Final   RBC, Urine 11/21/2021 11-30 (A)  0 - 2 /hpf Final   Epithelial Cells (non renal) 11/21/2021 0-10  0 - 10 /hpf Final   Renal Epithel, UA 11/21/2021 0-10  None seen /hpf Final   Bacteria, UA 11/21/2021 Many (A)  None seen/Few Final  Admission on 11/20/2021, Discharged on 11/20/2021  Component Date Value Ref Range Status   Color, Urine 11/20/2021 STRAW (A)  YELLOW Final   APPearance 11/20/2021 CLEAR (A)  CLEAR Final   Specific Gravity, Urine 11/20/2021 1.008  1.005 - 1.030 Final   pH 11/20/2021 5.0  5.0 - 8.0 Final   Glucose, UA 11/20/2021 NEGATIVE  NEGATIVE mg/dL Final   Hgb urine dipstick 11/20/2021 SMALL (A)  NEGATIVE Final   Bilirubin Urine 11/20/2021 NEGATIVE  NEGATIVE Final   Ketones, ur 11/20/2021 NEGATIVE  NEGATIVE mg/dL Final   Protein, ur 11/20/2021 NEGATIVE  NEGATIVE mg/dL Final   Nitrite 11/20/2021 NEGATIVE   NEGATIVE Final   Leukocytes,Ua 11/20/2021 NEGATIVE  NEGATIVE Final   RBC / HPF 11/20/2021 0-5  0 - 5 RBC/hpf Final   WBC, UA 11/20/2021 0-5  0 - 5 WBC/hpf Final   Bacteria, UA 11/20/2021 RARE (A)  NONE SEEN Final   Squamous Epithelial / LPF 11/20/2021 0-5  0 - 5 Final   Mucus 11/20/2021 PRESENT   Final   Hyaline Casts, UA 11/20/2021 PRESENT   Final   Performed at [facility], [address] [place], [place] [address]   Sodium 11/20/2021 138  135 - 145 mmol/L Final   Potassium 11/20/2021 3.9  3.5 - 5.1 mmol/L Final   Chloride 11/20/2021 108  98 - 111 mmol/L Final   CO2 11/20/2021 16 (L)  22 - 32 mmol/L Final   Glucose, Bld 11/20/2021 123 (H)  70 - 99 mg/dL Final   Glucose reference range applies only to samples taken after fasting for at least 8 hours.   BUN 11/20/2021 21 (H)  6 - 20 mg/dL Final   Creatinine, Ser 11/20/2021 1.19 (H)  0.44 - 1.00 mg/dL Final   Calcium 11/20/2021 11.0 (H)  8.9 - 10.3 mg/dL Final   GFR, Estimated 11/20/2021 54 (L)  >60 mL/min Final   Comment: (NOTE) Calculated using the CKD-EPI Creatinine Equation (2021)    Anion gap 11/20/2021 14  5 - 15 Final   Performed at [facility], [place], [place] [address]   WBC 11/20/2021 7.0  4.0 - 10.5 K/uL Final   RBC 11/20/2021 5.14 (H)  3.87 - 5.11 MIL/uL Final   Hemoglobin 11/20/2021 15.2 (H)  12.0 - 15.0 g/dL Final   HCT 11/20/2021 44.1  36.0 - 46.0 % Final   MCV 11/20/2021 85.8  80.0 - 100.0 fL Final   MCH 11/20/2021 29.6  26.0 - 34.0 pg Final   MCHC 11/20/2021 34.5  30.0 - 36.0 g/dL Final   RDW 11/20/2021 11.9  11.5 - 15.5 % Final   Platelets 11/20/2021 162  150 - 400 K/uL Final   nRBC 11/20/2021 0.0  0.0 - 0.2 % Final   Neutrophils Relative % 11/20/2021 92  % Final   Neutro Abs 11/20/2021 6.5  1.7 - 7.7 K/uL Final   Lymphocytes Relative 11/20/2021 6  % Final   Lymphs Abs 11/20/2021 0.4 (L)  0.7 - 4.0 K/uL Final   Monocytes Relative 11/20/2021 1  % Final   Monocytes Absolute  11/20/2021 0.1  0.1 - 1.0 K/uL Final  Eosinophils Relative 11/20/2021 0  % Final   Eosinophils Absolute 11/20/2021 0.0  0.0 - 0.5 K/uL Final   Basophils Relative 11/20/2021 0  % Final   Basophils Absolute 11/20/2021 0.0  0.0 - 0.1 K/uL Final   Immature Granulocytes 11/20/2021 1  % Final   Abs Immature Granulocytes 11/20/2021 0.05  0.00 - 0.07 K/uL Final   Performed at [facility], [address], [place], [place] [address]   Specimen Description 11/20/2021    Final                   Value:URINE, RANDOM Performed at [facility], [address], [place], [place] [address]    Special Requests 11/20/2021    Final                   Value:NONE Performed at [facility], [address] [place], [place] [address]    Culture 11/20/2021  (A)   Final                   Value:60,000 COLONIES/mL ESCHERICHIA COLI 20,000 COLONIES/mL STREPTOCOCCUS GALLOLYTICUS SUSCEPTIBILITIES TO FOLLOW Performed at [facility], [place] [address] [place], [place] [address]    Report Status 11/20/2021 PENDING   Incomplete   Organism ID, Bacteria 11/20/2021 ESCHERICHIA COLI (A)   Final    ECG: Date: 11/15/2021 Time ECG obtained: 1239 PM Rate: 62 bpm Rhythm:  AV dual paced rhythm Axis (leads I and aVF): Normal Intervals: PR 146 ms. QRS 140 ms. QTc 475 ms. ST segment and T wave changes: No evidence of acute ST segment elevation or depression Comparison: Similar to previous tracing obtained on 07/17/2018   IMAGING / PROCEDURES: DIAGNOSTIC RADIOGRAPHS OF RIGHT KNEE 3 VIEWS performed on 11/20/2021 Severe loss of medial compartment joint space with significant osteophyte formation.   No fractures or dislocations.   No osseous abnormality noted.  TRANSTHORACIC ECHOCARDIOGRAM performed on 05/22/2021 Moderately reduced left ventricular systolic function with mild LVH; LVEF 29% Diastolic Doppler parameters consistent with abnormal relaxation (G1DD). Trivial AR and MR Mild TR  and PR Normal gradients; no valvular stenosis Mildly dilated ascending aorta measuring up to 3.8 cm Mildly dilated aortic root measuring 3.5 cm No pericardial effusion  RIGHT/LEFT HEART CATHETERIZATION AND CORONARY ANGIOGRAPHY performed on 08/27/2016 Severely reduced right ventricular systolic function with an EF of 20-25% Global hypokinesis Moderately elevated LVEDP 23 mmHg Normal coronary arteries Mild mitral valve regurgitation Hemodynamics RA 12/13 (11) mmHg RV 46/8 (13) mmHg PA 42/22 (30) mmHg PCWP 27/29 (23) mmHg LV 133 mmHg . LVEDP: 23 mmHg AO 133/83 (86) mmHg PA saturation 65% AO saturation 97% Cardiac Output (Fick) 3.09  Cardiac Index (Fick) 1.62    CARDIAC MRI MORPHOLOGY AND FUNCTION WITH AND WITHOUT CONTRAST performed on 09/12/2016 The left ventricle is moderately enlarged in cavity size. The LVEF is 22%. There is severe, global systolic dysfunction. The right ventricle is normal in size with mild systolic dysfunction.    The left atrium is severely enlarged in cavity size. The right atrium is mildly enlarged.  There is moderate central, functional mitral regurgitation ([place] Class 1).  Delayed enhancement imaging for viability is normal. No hyperenhancement to suggest myocardial infarction, scar, or infiltrative process.   MYOCARDIAL PERFUSION IMAGING STUDY (LEXISCAN) performed on 08/14/2016 Severely reduced left ventricular systolic function with an EF of 21% Global hypokinesis of the left ventricle  Inferoapical and inferoseptal akinesis with stress and rest No artifact Left ventricular cavity size enlarged  Moderate diffusion abnormality present in the inferoseptal and inferoapical region on stress images No evidence of ischemia  Impression and Plan:  Erin Stuart has been referred for pre-anesthesia review and clearance prior to her undergoing the planned anesthetic and procedural courses. Available labs, pertinent testing, and imaging results were  personally reviewed by me. This patient has been appropriately cleared by cardiology with an overall LOW/ACCEPTABLE risk of significant perioperative cardiovascular complications. Completed perioperative prescription for cardiac device management documentation completed by primary cardiology team and placed on patient's chart for review by the surgical/anesthetic team on the day of her procedure.   Based on clinical review performed today (11/22/21), barring any significant acute changes in the patient's overall condition, it is anticipated that she will be able to proceed with the planned surgical intervention. Any acute changes in clinical condition may necessitate her procedure being postponed and/or cancelled. Patient will meet with anesthesia team (MD and/or CRNA) on the day of her procedure for preoperative evaluation/assessment. Questions regarding anesthetic course will be fielded at that time.   Pre-surgical instructions were reviewed with the patient during her PAT appointment and questions were fielded by PAT clinical staff. Patient was advised that if any questions or concerns arise prior to her procedure then she should return a call to PAT and/or her surgeon's office to discuss.  Honor Loh, MSN, APRN, FNP-C, CEN [facility]  Peri-operative Services Nurse Practitioner Phone: [phone] Fax: [phone] 11/22/21 5:45 PM  NOTE: This note has been prepared using Dragon dictation software. Despite my best ability to proofread, there is always the potential that unintentional transcriptional errors may still occur from this process.

## 2021-11-20 NOTE — ED Notes (Signed)
Patient verbalizes understanding of discharge instructions. Opportunity for questioning and answers were provided. Armband removed by staff, pt discharged from ED. Wheeled out to car, pt will be driven home by her husband

## 2021-11-20 NOTE — ED Triage Notes (Signed)
Pt to ED via POV from home. Pt reports left flank pain. Pt with hx kidney stones. Pt was seen by Dr. Jimmye Norman and was prescribed percocet. Pt took pain medication and states no relief. Pt also endorses N/V. Pt with pacemaker in place and states her BP is high and feels like it is messing with her pacemaker. Pt reports BP was 718D systolic.

## 2021-11-20 NOTE — Discharge Instructions (Addendum)
No driving this evening or while using oxycodone (which you were prescribed previous)  Follow-up closely with urology tomorrow as scheduled.  Return to the ER if you have severe pain, nausea vomiting weakness fevers or other concerns arise.

## 2021-11-20 NOTE — ED Provider Notes (Signed)
[facility] Provider Note    Event Date/Time   First MD Initiated Contact with Patient 11/20/21 1934     (approximate)   History   Flank Pain   HPI  Erin Stuart is a 56 y.o. female who on review of primary care records has a history of left bundle branch block congestive heart failure with cardiomyopathy and also recent preoperative planning for knee surgery  This morning after being evaluated for anesthesia for her upcoming surgery she started having a severe sudden left flank pain in her left flank rating towards her left groin.  No fevers or chills no pain or burning with urination.  She reports she has had the same symptoms before and is confident is a "kidney stone"  She called urology and has an appointment urgently with Dr. Bernardo Heater for tomorrow but reports she could not bear the pain which she reports is severe and 10 out of 10 in the left flank.  Morphine at triage alleviate her pain notably, but she reports it is becoming severe once again.  Of note she also appears to be in quite severe pain at the present time     Physical Exam   Triage Vital Signs: ED Triage Vitals  Enc Vitals Group     BP 11/20/21 1442 130/86     Pulse Rate 11/20/21 1442 76     Resp 11/20/21 1442 18     Temp 11/20/21 1442 98.1 F (36.7 C)     Temp Source 11/20/21 1947 Oral     SpO2 11/20/21 1442 99 %     Weight 11/20/21 1426 205 lb 4 oz (93.1 kg)     Height 11/20/21 1426 '5\' 4"'$  (1.626 m)     Head Circumference --      Peak Flow --      Pain Score 11/20/21 1426 10     Pain Loc --      Pain Edu? --      Excl. in [place]? --     Most recent vital signs: Vitals:   11/20/21 2052 11/20/21 2209  BP:  91/72  Pulse: 78 74  Resp: 18 18  Temp:    SpO2: 98% 98%     General: Awake, appears in painful distress, rocking back and forth on the bed feels nauseated, holding hand over left flank. CV:  Good peripheral perfusion.  Warm well-perfused normal rate Resp:  Normal  effort.  Speaks clearly without distress Abd:  No distention.  Notable left flank pain and discomfort. Other:  Fully alert well oriented   ED Results / Procedures / Treatments   Labs (all labs ordered are listed, but only abnormal results are displayed) Labs Reviewed  URINALYSIS, ROUTINE W REFLEX MICROSCOPIC - Abnormal; Notable for the following components:      Result Value   Color, Urine STRAW (*)    APPearance CLEAR (*)    Hgb urine dipstick SMALL (*)    Bacteria, UA RARE (*)    All other components within normal limits  BASIC METABOLIC PANEL - Abnormal; Notable for the following components:   CO2 16 (*)    Glucose, Bld 123 (*)    BUN 21 (*)    Creatinine, Ser 1.19 (*)    Calcium 11.0 (*)    GFR, Estimated 54 (*)    All other components within normal limits  CBC WITH DIFFERENTIAL/PLATELET - Abnormal; Notable for the following components:   RBC 5.14 (*)    Hemoglobin  15.2 (*)    Lymphs Abs 0.4 (*)    All other components within normal limits  URINE CULTURE     EKG     RADIOLOGY  CT interpreted by me as hydronephrosis with suspected kidney stone on the left proximal ureter   CT Renal Stone Study  Result Date: 11/20/2021 CLINICAL DATA:  Flank pain, kidney stone suspected Left flank pain.  History of kidney stones. EXAM: CT ABDOMEN AND PELVIS WITHOUT CONTRAST TECHNIQUE: Multidetector CT imaging of the abdomen and pelvis was performed following the standard protocol without IV contrast. RADIATION DOSE REDUCTION: This exam was performed according to the departmental dose-optimization program which includes automated exposure control, adjustment of the mA and/or kV according to patient size and/or use of iterative reconstruction technique. COMPARISON:  Remote CT 07/16/2006 FINDINGS: Lower chest: Pacemaker wires partially included. No basilar airspace disease or pleural effusion. Hepatobiliary: Diffuse hepatic low density consistent with steatosis. There is focal fatty sparing  adjacent to the gallbladder fossa. Simple cyst in the left lobe of the liver, no imaging follow-up is recommended. No suspicious renal lesion. Gallbladder physiologically distended, no calcified stone. No biliary dilatation. Pancreas: No ductal dilatation or inflammation. Spleen: Normal in size without focal abnormality. Adrenals/Urinary Tract: Normal adrenal glands. Obstructing 4 x 5 mm stone in the left proximal ureter (at the level of L3-L4) with moderate proximal hydroureteronephrosis. There is prominent left perinephric edema. The more distal ureter is decompressed. Multiple punctate intrarenal calculi on the left. There are multiple punctate right intrarenal calculi without right hydronephrosis. No right ureteral stone. Urinary bladder near completely empty. No bladder stone. Stomach/Bowel: Decompressed stomach. No bowel obstruction or inflammation. Normal appendix. Colonic diverticulosis, and diffusely throughout the colon, most prominently involving the distal descending and proximal sigmoid colon. No diverticulitis. Small colonic stool burden. Vascular/Lymphatic: Normal caliber abdominal aorta. No suspicious adenopathy. Reproductive: Uterus and bilateral adnexa are unremarkable. Other: No free air or ascites. Tiny fat containing umbilical hernia. Musculoskeletal: Facet hypertrophy at L4-L5 with resultant grade 1 anterolisthesis. There are no acute or suspicious osseous abnormalities. IMPRESSION: 1. Obstructing 4 x 5 mm stone in the left proximal ureter with moderate hydronephrosis. There is prominent left perinephric edema, the degree of which raises possibility of caliceal rupture. 2. Multiple punctate additional nonobstructing stones in both kidneys. 3. Colonic diverticulosis without diverticulitis. 4. Hepatic steatosis. Electronically Signed   By: Keith Rake M.D.   On: 11/20/2021 16:35        PROCEDURES:  Critical Care performed: No  Procedures   MEDICATIONS ORDERED IN ED: Medications   ondansetron (ZOFRAN-ODT) disintegrating tablet 4 mg (has no administration in time range)  sodium chloride 0.9 % bolus 1,000 mL (0 mLs Intravenous Stopped 11/20/21 1948)  morphine (PF) 4 MG/ML injection 4 mg (4 mg Intravenous Given 11/20/21 1442)  metoCLOPramide (REGLAN) injection 10 mg (10 mg Intravenous Given 11/20/21 1442)  morphine (PF) 4 MG/ML injection 4 mg (4 mg Intravenous Given 11/20/21 2025)  ondansetron (ZOFRAN) injection 4 mg (4 mg Intravenous Given 11/20/21 2025)  sodium chloride 0.9 % bolus 500 mL (0 mLs Intravenous Stopped 11/20/21 2232)  ketorolac (TORADOL) 30 MG/ML injection 15 mg (15 mg Intravenous Given 11/20/21 2055)     IMPRESSION / MDM / ASSESSMENT AND PLAN / ED COURSE  I reviewed the triage vital signs and the nursing notes.                              Differential  diagnosis includes but is not limited to, abdominal perforation, aortic dissection, cholecystitis, appendicitis, diverticulitis, colitis, esophagitis/gastritis, kidney stone, pyelonephritis, urinary tract infection, aortic aneurysm. All are considered in decision and treatment plan. Based upon the patient's presentation and risk factors, and given the work-up that is occurred at the time of triage patient appears to be suffering from acute left-sided nephrolithiasis.  She denies any systemic symptoms such as fevers or chills.  No pain or burning with urination.  Flank pain improved with morphine, but recurred within a couple hours of initial dose, with severe pain by the time I resaw her.  Urinalysis shows some rare bacteria no obvious evidence of acute infection.  Blood is present.  No white cells no ketones no leukocytes no nitrates.  Labs notable for AKI very mild, but also mild reduction in CO2 with normal anion gap.  GFR 54.  Has a history of congestive heart failure cardiomyopathy, will hydrate gently but appears slightly hypovolemic with dry mucous membranes and reported frequent vomiting     Patient's  presentation is most consistent with acute complicated illness / injury requiring diagnostic workup.  The patient is on the pulse oximetry monitor.  Clinical Course as of 11/20/21 2236  Tue Nov 20, 2021  2051 Dr. Al Pimple discussed case, CT, labs, etc. Advises ok to give some toradol for now. Pain control, if out of control, admit for urology consult or see Urology tomorrow.  [MQ]    Clinical Course User Index [MQ] Delman Kitten, MD   ----------------------------------------- 8:53 PM on 11/20/2021 ----------------------------------------- Pain has started to improve after total of 8 mg morphine.  No longer nauseated or vomiting.  Will give Toradol, plan to reassess and disposition based on symptomatic control.  Patient has appointment with urology tomorrow morning, if discharged she plans to attend that  ----------------------------------------- 10:36 PM on 11/20/2021 ----------------------------------------- Patient resting comfortably pain much better controlled.  She reports just very mild nausea, also hungry would like something to eat.  She is much improved.  Currently resting comfortably without ongoing distress.  Will prescribe Zofran.  She received a prescription for oxycodone from outpatient provider earlier today, and has that available for breakthrough pain, and plan in place to follow-up with urology tomorrow as scheduled.  Return precautions and treatment recommendations and follow-up discussed with the patient who is agreeable with the plan.  Patient's husband driving her home.  FINAL CLINICAL IMPRESSION(S) / ED DIAGNOSES   Final diagnoses:  Kidney stone on left side     Rx / DC Orders   ED Discharge Orders          Ordered    ondansetron (ZOFRAN-ODT) 4 MG disintegrating tablet  Every 6 hours PRN        11/20/21 2236             Note:  This document was prepared using Dragon voice recognition software and may include unintentional dictation errors.    Delman Kitten, MD 11/20/21 2237

## 2021-11-20 NOTE — ED Provider Triage Note (Signed)
Emergency Medicine Provider Triage Evaluation Note  Erin Stuart , a 56 y.o. female  was evaluated in triage.  Pt complains of flank pain.  Severe pain.  Seen by Dr. Jimmye Norman yesterday and given oxycodone and Zofran which she has not been able to tolerate.  Has had multiple episodes of vomiting..  Review of Systems  Positive: Flank pain Negative:   Physical Exam  Ht '5\' 4"'$  (1.626 m)   Wt 93.1 kg   LMP 05/27/2016   BMI 35.23 kg/m  Gen:   Awake, no distress   Resp:  Normal effort  MSK:   Moves extremities without difficulty  Other:    Medical Decision Making  Medically screening exam initiated at 2:27 PM.  Appropriate orders placed.  Erin Stuart was informed that the remainder of the evaluation will be completed by another provider, this initial triage assessment does not replace that evaluation, and the importance of remaining in the ED until their evaluation is complete.  Patient appears to be in fair amount of pain.  She is rocking back and forth.  Strict nursing staff to insert IV give her pain medication nausea medication while awaiting CT.   Versie Starks, PA-C 11/20/21 1428

## 2021-11-21 ENCOUNTER — Ambulatory Visit
Admission: RE | Admit: 2021-11-21 | Discharge: 2021-11-21 | Disposition: A | Discharge status: Home / Self Care | Payer: 59 | Source: Ambulatory Visit | Attending: Urology | Admitting: Urology

## 2021-11-21 ENCOUNTER — Ambulatory Visit: Payer: 59 | Admitting: Urology

## 2021-11-21 ENCOUNTER — Encounter: Payer: Self-pay | Admitting: Urology

## 2021-11-21 VITALS — BP 120/68 | HR 83 | Ht 64.0 in | Wt 205.0 lb

## 2021-11-21 DIAGNOSIS — R109 Unspecified abdominal pain: Secondary | ICD-10-CM | POA: Diagnosis not present

## 2021-11-21 DIAGNOSIS — N132 Hydronephrosis with renal and ureteral calculous obstruction: Secondary | ICD-10-CM

## 2021-11-21 DIAGNOSIS — N200 Calculus of kidney: Secondary | ICD-10-CM | POA: Diagnosis not present

## 2021-11-21 DIAGNOSIS — N201 Calculus of ureter: Secondary | ICD-10-CM | POA: Diagnosis not present

## 2021-11-21 DIAGNOSIS — N23 Unspecified renal colic: Secondary | ICD-10-CM

## 2021-11-21 LAB — URINALYSIS, COMPLETE
Bilirubin, UA: NEGATIVE
Glucose, UA: NEGATIVE
Ketones, UA: NEGATIVE
Nitrite, UA: NEGATIVE
Specific Gravity, UA: 1.015 (ref 1.005–1.030)
Urobilinogen, Ur: 0.2 mg/dL (ref 0.2–1.0)
pH, UA: 5 (ref 5.0–7.5)

## 2021-11-21 LAB — MICROSCOPIC EXAMINATION: WBC, UA: >30 /hpf — AB (ref 0–5)

## 2021-11-21 MED ORDER — TAMSULOSIN HCL 0.4 MG PO CAPS: 0.4000 mg | ORAL_CAPSULE | Freq: Every day | ORAL | 0 refills | Status: DC

## 2021-11-21 NOTE — H&P (View-Only) (Signed)
11/21/2021 10:11 AM   Erin Stuart July 01, 1965 [id number]  Referring provider: Marinda Elk, MD [facility] [place],  Erin Stuart [id number]  Chief Complaint  Patient presents with   Nephrolithiasis    HPI: Erin Stuart is a 56 y.o. female self referred for the evaluation of nephrolithiasis  Prior history of recurrent stone disease.  Previous ureteroscopy and stent placement at [facility] several years ago Most recently followed by Dr. Yves Dill but has not required any stone intervention She had an appointment scheduled today however last night had acute onset of left flank pain.  Severity rated 10/10 with associated nausea and vomiting.  No fever or chills Stone protocol CT was performed which showed a 5 mm left proximal ureteral calculus with moderate left hydronephrosis Her pain did not improve significantly with parenteral narcotic however it resolved with Toradol. She has not had recurrent pain since her ED visit and has no complaints this morning Scheduled for knee replacement surgery with Dr. Marry Guan next Monday Urinalysis in ED was unremarkable.  A urine culture was ordered.  She did have a positive urine culture for E. coli 11/15/2021 as part of her preoperative visit   PMH: Past Medical History:  Diagnosis Date   Acquired dilation of ascending aorta and aortic root ([place]) 05/22/2021   a.) TTE 05/22/2021: Ao root 3.8 cm; asc Ao 3.5 cm   Anginal pain ([facility])    Anxiety    a.) on BZO (alprazolam) PRN   Arthritis    Asthma    CHF (congestive heart failure) ([place])    Depression    Dilated cardiomyopathy ([place])    a.) R/LHC 09/02/2016: EF <25%; b.) TTE 08/13/2016: EF <15%; c.) MPI 08/14/2016: EF 21%; d.) cMRI 09/12/2016: EF 22%, no LGE; e.) s/p CRT-D placement 11/27/2016; f.) TTE 01/09/2017: EF 25%; g.) TTE 09/11/2017: EF 25-30%; h.) TTE 10/09/2018: EF 35%; i.) TTE 09/10/2019: EF 45%; j.) TTE 08/04/2020: EF 45%; k.) TTE 05/22/2021: EF 45%   History  of kidney stones    Hyperlipidemia    LBBB (left bundle branch block) 2012   PONV (postoperative nausea and vomiting)    a.) single remote episode   Pre-diabetes    Presence of cardiac resynchronization therapy defibrillator (CRT-D) 11/27/2016   Pulmonary HTN ([place]) 08/13/2016   a.) TTE 08/13/2017: RVSP 41.2; b.) TTE 01/09/2017: RVSP 36.9; c.) TTE 09/11/2017: RVSP 30.4   Vitamin D deficiency     Surgical History: Past Surgical History:  Procedure Laterality Date   BI-VENTRICULAR IMPLANTABLE CARDIOVERTER DEFIBRILLATOR  (CRT-D) N/A 11/27/2016   Procedure: BI-VENTRICULAR IMPLANTABLE CARDIOVERTER DEFIBRILLATOR  (CRT-D); Location: [facility]; Surgeon: Mylinda Latina, MD   COLONOSCOPY WITH PROPOFOL N/A 06/16/2020   Procedure: COLONOSCOPY WITH PROPOFOL;  Surgeon: Jonathon Bellows, MD;  Location: [facility] ENDOSCOPY;  Service: Gastroenterology;  Laterality: N/A;   RIGHT/LEFT HEART CATH AND CORONARY ANGIOGRAPHY Bilateral 08/27/2016   Procedure: Right/Left Heart Cath and Coronary Angiography;  Surgeon: Teodoro Spray, MD;  Location: [facility];  Service: Cardiovascular;  Laterality: Bilateral;   WISDOM TOOTH EXTRACTION      Home Medications:  Allergies as of 11/21/2021   No Known Allergies      Medication List        Accurate as of November 21, 2021 10:11 AM. If you have any questions, ask your nurse or doctor.          Alive Womens 50+ Tabs Take 1 tablet by mouth once a week.   ALPRAZolam 1  MG tablet Commonly known as: Xanax Take 1 tablet (1 mg total) by mouth 3 (three) times daily as needed for anxiety.   aspirin 81 MG tablet Take 81 mg by mouth every evening. Reported on 03/02/2015   beclomethasone 40 MCG/ACT inhaler Commonly known as: QVAR Inhale 2 puffs into the lungs 2 (two) times daily.   buPROPion 150 MG 24 hr tablet Commonly known as: WELLBUTRIN XL Take 150 mg by mouth daily.   carvedilol 6.25 MG tablet Commonly known as: COREG Take 6.25 mg by mouth 2 (two) times  daily.   furosemide 20 MG tablet Commonly known as: LASIX Take 20 mg by mouth daily.   ibuprofen 200 MG tablet Commonly known as: ADVIL Take 800 mg by mouth daily as needed for moderate pain.   losartan 25 MG tablet Commonly known as: COZAAR Take 25 mg by mouth daily.   ondansetron 4 MG disintegrating tablet Commonly known as: ZOFRAN-ODT Take 1 tablet (4 mg total) by mouth every 6 (six) hours as needed for nausea or vomiting.   rosuvastatin 5 MG tablet Commonly known as: CRESTOR Take 5 mg by mouth daily.   spironolactone 25 MG tablet Commonly known as: ALDACTONE Take 25 mg by mouth daily.   vitamin C 100 MG tablet Take 500 mg by mouth as needed. gummies   Vitamin D3 125 MCG (5000 UT) Caps Take by mouth 2 (two) times a week.        Allergies: No Known Allergies  Family History: Family History  Problem Relation Age of Onset   Cancer Paternal Aunt        ovairan   Breast cancer Neg Hx     Social History:  reports that she has never smoked. She has never used smokeless tobacco. She reports that she does not drink alcohol and does not use drugs.   Physical Exam: BP 120/68   Pulse 83   Ht '5\' 4"'$  (1.626 m)   Wt 205 lb (93 kg)   LMP 05/27/2016   BMI 35.19 kg/m   Constitutional:  Alert and oriented, No acute distress. HEENT: [place] AT Neurologic: Grossly intact, no focal deficits, moving all 4 extremities. Psychiatric: Normal mood and affect.  Laboratory Data:  Urinalysis Dipstick 1+ blood/1+ leukocytes/1+ protein Microscopy >30 WBC/11-30 RBC/0-10 epis   Pertinent Imaging: CT images were personally reviewed and interpreted  CT Renal Stone Study  Narrative CLINICAL DATA:  Flank pain, kidney stone suspected  Left flank pain.  History of kidney stones.  EXAM: CT ABDOMEN AND PELVIS WITHOUT CONTRAST  TECHNIQUE: Multidetector CT imaging of the abdomen and pelvis was performed following the standard protocol without IV contrast.  RADIATION DOSE  REDUCTION: This exam was performed according to the departmental dose-optimization program which includes automated exposure control, adjustment of the mA and/or kV according to patient size and/or use of iterative reconstruction technique.  COMPARISON:  Remote CT 07/16/2006  FINDINGS: Lower chest: Pacemaker wires partially included. No basilar airspace disease or pleural effusion.  Hepatobiliary: Diffuse hepatic low density consistent with steatosis. There is focal fatty sparing adjacent to the gallbladder fossa. Simple cyst in the left lobe of the liver, no imaging follow-up is recommended. No suspicious renal lesion. Gallbladder physiologically distended, no calcified stone. No biliary dilatation.  Pancreas: No ductal dilatation or inflammation.  Spleen: Normal in size without focal abnormality.  Adrenals/Urinary Tract: Normal adrenal glands. Obstructing 4 x 5 mm stone in the left proximal ureter (at the level of L3-L4) with moderate proximal hydroureteronephrosis. There is  prominent left perinephric edema. The more distal ureter is decompressed. Multiple punctate intrarenal calculi on the left. There are multiple punctate right intrarenal calculi without right hydronephrosis. No right ureteral stone. Urinary bladder near completely empty. No bladder stone.  Stomach/Bowel: Decompressed stomach. No bowel obstruction or inflammation. Normal appendix. Colonic diverticulosis, and diffusely throughout the colon, most prominently involving the distal descending and proximal sigmoid colon. No diverticulitis. Small colonic stool burden.  Vascular/Lymphatic: Normal caliber abdominal aorta. No suspicious adenopathy.  Reproductive: Uterus and bilateral adnexa are unremarkable.  Other: No free air or ascites. Tiny fat containing umbilical hernia.  Musculoskeletal: Facet hypertrophy at L4-L5 with resultant grade 1 anterolisthesis. There are no acute or suspicious  osseous abnormalities.  IMPRESSION: 1. Obstructing 4 x 5 mm stone in the left proximal ureter with moderate hydronephrosis. There is prominent left perinephric edema, the degree of which raises possibility of caliceal rupture. 2. Multiple punctate additional nonobstructing stones in both kidneys. 3. Colonic diverticulosis without diverticulitis. 4. Hepatic steatosis.   Electronically Signed By: Keith Rake M.D. On: 11/20/2021 16:35   Assessment & Plan:    1.  Left proximal ureteral calculus Scheduled for knee replacement surgery next week and they have a call into Dr. Marry Guan regarding his recommendations We discussed various treatment options for urolithiasis including observation with or without medical expulsive therapy, shockwave lithotripsy (SWL), ureteroscopy and laser lithotripsy with stent placement. We discussed that management is based on stone size, location, density, patient co-morbidities, and patient preference.  Stones <3m in size have a >80% spontaneous passage rate. Data surrounding the use of tamsulosin for medical expulsive therapy is controversial, but meta analyses suggests it is most efficacious for distal stones between 5-137min size. SWL has a lower stone free rate in a single procedure, but also a lower complication rate compared to ureteroscopy and avoids a stent and associated stent related symptoms. Possible complications include renal hematoma, steinstrasse, and need for additional treatment. Ureteroscopy with laser lithotripsy and stent placement has a higher stone free rate than SWL in a single procedure, however increased complication rate including possible infection, ureteral injury, bleeding, and stent related morbidity. Common stent related symptoms include dysuria, urgency/frequency, and flank pain. Sent she received Toradol yesterday evening would not be able to do SWL until Thursday, 11/29/2021 I did inform her Dr. SnDiamantina Providenceas time available on  his schedule Friday, 11/23/2021 for ureteroscopy She will think of these options and before making a decision we will discuss with Dr. HoMarry Guanx tamsulosin was sent to pharmacy Urine that yesterday was normal end today with pyuria.  A urine culture was ordered in the ED last night and is pending.  She is asymptomatic and no indication of infection    ScAbbie SonsMD  [facility] [phone]

## 2021-11-21 NOTE — Progress Notes (Signed)
11/21/2021 10:11 AM   Erin Stuart 05-Sep-1965 [id number]  Referring provider: Marinda Elk, MD [facility] [place],  [place] [address]  Chief Complaint  Patient presents with   Nephrolithiasis    HPI: Erin Stuart is a 56 y.o. female self referred for the evaluation of nephrolithiasis  Prior history of recurrent stone disease.  Previous ureteroscopy and stent placement at [facility] several years ago Most recently followed by Dr. Yves Dill but has not required any stone intervention She had an appointment scheduled today however last night had acute onset of left flank pain.  Severity rated 10/10 with associated nausea and vomiting.  No fever or chills Stone protocol CT was performed which showed a 5 mm left proximal ureteral calculus with moderate left hydronephrosis Her pain did not improve significantly with parenteral narcotic however it resolved with Toradol. She has not had recurrent pain since her ED visit and has no complaints this morning Scheduled for knee replacement surgery with Dr. Marry Guan next Monday Urinalysis in ED was unremarkable.  A urine culture was ordered.  She did have a positive urine culture for E. coli 11/15/2021 as part of her preoperative visit   PMH: Past Medical History:  Diagnosis Date   Acquired dilation of ascending aorta and aortic root ([place]) 05/22/2021   a.) TTE 05/22/2021: Ao root 3.8 cm; asc Ao 3.5 cm   Anginal pain (HCC)    Anxiety    a.) on BZO (alprazolam) PRN   Arthritis    Asthma    CHF (congestive heart failure) ([place])    Depression    Dilated cardiomyopathy ([place])    a.) R/LHC 09/02/2016: EF <25%; b.) TTE 08/13/2016: EF <15%; c.) MPI 08/14/2016: EF 21%; d.) cMRI 09/12/2016: EF 22%, no LGE; e.) s/p CRT-D placement 11/27/2016; f.) TTE 01/09/2017: EF 25%; g.) TTE 09/11/2017: EF 25-30%; h.) TTE 10/09/2018: EF 35%; i.) TTE 09/10/2019: EF 45%; j.) TTE 08/04/2020: EF 45%; k.) TTE 05/22/2021: EF 45%   History  of kidney stones    Hyperlipidemia    LBBB (left bundle branch block) 2012   PONV (postoperative nausea and vomiting)    a.) single remote episode   Pre-diabetes    Presence of cardiac resynchronization therapy defibrillator (CRT-D) 11/27/2016   Pulmonary HTN ([place]) 08/13/2016   a.) TTE 08/13/2017: RVSP 41.2; b.) TTE 01/09/2017: RVSP 36.9; c.) TTE 09/11/2017: RVSP 30.4   Vitamin D deficiency     Surgical History: Past Surgical History:  Procedure Laterality Date   BI-VENTRICULAR IMPLANTABLE CARDIOVERTER DEFIBRILLATOR  (CRT-D) N/A 11/27/2016   Procedure: BI-VENTRICULAR IMPLANTABLE CARDIOVERTER DEFIBRILLATOR  (CRT-D); Location: [facility]; Surgeon: Mylinda Latina, MD   COLONOSCOPY WITH PROPOFOL N/A 06/16/2020   Procedure: COLONOSCOPY WITH PROPOFOL;  Surgeon: Jonathon Bellows, MD;  Location: [facility] ENDOSCOPY;  Service: Gastroenterology;  Laterality: N/A;   RIGHT/LEFT HEART CATH AND CORONARY ANGIOGRAPHY Bilateral 08/27/2016   Procedure: Right/Left Heart Cath and Coronary Angiography;  Surgeon: Teodoro Spray, MD;  Location: [facility];  Service: Cardiovascular;  Laterality: Bilateral;   WISDOM TOOTH EXTRACTION      Home Medications:  Allergies as of 11/21/2021   No Known Allergies      Medication List        Accurate as of November 21, 2021 10:11 AM. If you have any questions, ask your nurse or doctor.          Alive Womens 50+ Tabs Take 1 tablet by mouth once a week.   ALPRAZolam 1  MG tablet Commonly known as: Xanax Take 1 tablet (1 mg total) by mouth 3 (three) times daily as needed for anxiety.   aspirin 81 MG tablet Take 81 mg by mouth every evening. Reported on 03/02/2015   beclomethasone 40 MCG/ACT inhaler Commonly known as: QVAR Inhale 2 puffs into the lungs 2 (two) times daily.   buPROPion 150 MG 24 hr tablet Commonly known as: WELLBUTRIN XL Take 150 mg by mouth daily.   carvedilol 6.25 MG tablet Commonly known as: COREG Take 6.25 mg by mouth 2 (two) times  daily.   furosemide 20 MG tablet Commonly known as: LASIX Take 20 mg by mouth daily.   ibuprofen 200 MG tablet Commonly known as: ADVIL Take 800 mg by mouth daily as needed for moderate pain.   losartan 25 MG tablet Commonly known as: COZAAR Take 25 mg by mouth daily.   ondansetron 4 MG disintegrating tablet Commonly known as: ZOFRAN-ODT Take 1 tablet (4 mg total) by mouth every 6 (six) hours as needed for nausea or vomiting.   rosuvastatin 5 MG tablet Commonly known as: CRESTOR Take 5 mg by mouth daily.   spironolactone 25 MG tablet Commonly known as: ALDACTONE Take 25 mg by mouth daily.   vitamin C 100 MG tablet Take 500 mg by mouth as needed. gummies   Vitamin D3 125 MCG (5000 UT) Caps Take by mouth 2 (two) times a week.        Allergies: No Known Allergies  Family History: Family History  Problem Relation Age of Onset   Cancer Paternal Aunt        ovairan   Breast cancer Neg Hx     Social History:  reports that she has never smoked. She has never used smokeless tobacco. She reports that she does not drink alcohol and does not use drugs.   Physical Exam: BP 120/68   Pulse 83   Ht '5\' 4"'$  (1.626 m)   Wt 205 lb (93 kg)   LMP 05/27/2016   BMI 35.19 kg/m   Constitutional:  Alert and oriented, No acute distress. HEENT: [place] AT Neurologic: Grossly intact, no focal deficits, moving all 4 extremities. Psychiatric: Normal mood and affect.  Laboratory Data:  Urinalysis Dipstick 1+ blood/1+ leukocytes/1+ protein Microscopy >30 WBC/11-30 RBC/0-10 epis   Pertinent Imaging: CT images were personally reviewed and interpreted  CT Renal Stone Study  Narrative CLINICAL DATA:  Flank pain, kidney stone suspected  Left flank pain.  History of kidney stones.  EXAM: CT ABDOMEN AND PELVIS WITHOUT CONTRAST  TECHNIQUE: Multidetector CT imaging of the abdomen and pelvis was performed following the standard protocol without IV contrast.  RADIATION DOSE  REDUCTION: This exam was performed according to the departmental dose-optimization program which includes automated exposure control, adjustment of the mA and/or kV according to patient size and/or use of iterative reconstruction technique.  COMPARISON:  Remote CT 07/16/2006  FINDINGS: Lower chest: Pacemaker wires partially included. No basilar airspace disease or pleural effusion.  Hepatobiliary: Diffuse hepatic low density consistent with steatosis. There is focal fatty sparing adjacent to the gallbladder fossa. Simple cyst in the left lobe of the liver, no imaging follow-up is recommended. No suspicious renal lesion. Gallbladder physiologically distended, no calcified stone. No biliary dilatation.  Pancreas: No ductal dilatation or inflammation.  Spleen: Normal in size without focal abnormality.  Adrenals/Urinary Tract: Normal adrenal glands. Obstructing 4 x 5 mm stone in the left proximal ureter (at the level of L3-L4) with moderate proximal hydroureteronephrosis. There is  prominent left perinephric edema. The more distal ureter is decompressed. Multiple punctate intrarenal calculi on the left. There are multiple punctate right intrarenal calculi without right hydronephrosis. No right ureteral stone. Urinary bladder near completely empty. No bladder stone.  Stomach/Bowel: Decompressed stomach. No bowel obstruction or inflammation. Normal appendix. Colonic diverticulosis, and diffusely throughout the colon, most prominently involving the distal descending and proximal sigmoid colon. No diverticulitis. Small colonic stool burden.  Vascular/Lymphatic: Normal caliber abdominal aorta. No suspicious adenopathy.  Reproductive: Uterus and bilateral adnexa are unremarkable.  Other: No free air or ascites. Tiny fat containing umbilical hernia.  Musculoskeletal: Facet hypertrophy at L4-L5 with resultant grade 1 anterolisthesis. There are no acute or suspicious  osseous abnormalities.  IMPRESSION: 1. Obstructing 4 x 5 mm stone in the left proximal ureter with moderate hydronephrosis. There is prominent left perinephric edema, the degree of which raises possibility of caliceal rupture. 2. Multiple punctate additional nonobstructing stones in both kidneys. 3. Colonic diverticulosis without diverticulitis. 4. Hepatic steatosis.   Electronically Signed By: Keith Rake M.D. On: 11/20/2021 16:35   Assessment & Plan:    1.  Left proximal ureteral calculus Scheduled for knee replacement surgery next week and they have a call into Dr. Marry Guan regarding his recommendations We discussed various treatment options for urolithiasis including observation with or without medical expulsive therapy, shockwave lithotripsy (SWL), ureteroscopy and laser lithotripsy with stent placement. We discussed that management is based on stone size, location, density, patient co-morbidities, and patient preference.  Stones <47m in size have a >80% spontaneous passage rate. Data surrounding the use of tamsulosin for medical expulsive therapy is controversial, but meta analyses suggests it is most efficacious for distal stones between 5-125min size. SWL has a lower stone free rate in a single procedure, but also a lower complication rate compared to ureteroscopy and avoids a stent and associated stent related symptoms. Possible complications include renal hematoma, steinstrasse, and need for additional treatment. Ureteroscopy with laser lithotripsy and stent placement has a higher stone free rate than SWL in a single procedure, however increased complication rate including possible infection, ureteral injury, bleeding, and stent related morbidity. Common stent related symptoms include dysuria, urgency/frequency, and flank pain. Sent she received Toradol yesterday evening would not be able to do SWL until Thursday, 11/29/2021 I did inform her Dr. SnDiamantina Providenceas time available on  his schedule Friday, 11/23/2021 for ureteroscopy She will think of these options and before making a decision we will discuss with Dr. HoMarry Guanx tamsulosin was sent to pharmacy Urine that yesterday was normal end today with pyuria.  A urine culture was ordered in the ED last night and is pending.  She is asymptomatic and no indication of infection    ScAbbie SonsMD  [facility] [phone]

## 2021-11-22 ENCOUNTER — Other Ambulatory Visit: Payer: Self-pay | Admitting: Urology

## 2021-11-22 ENCOUNTER — Telehealth: Payer: Self-pay | Admitting: *Deleted

## 2021-11-22 ENCOUNTER — Telehealth: Payer: Self-pay | Admitting: Urology

## 2021-11-22 ENCOUNTER — Other Ambulatory Visit: Payer: Self-pay | Admitting: *Deleted

## 2021-11-22 DIAGNOSIS — N201 Calculus of ureter: Secondary | ICD-10-CM

## 2021-11-22 MED ORDER — SULFAMETHOXAZOLE-TRIMETHOPRIM 800-160 MG PO TABS: 1.0000 | ORAL_TABLET | Freq: Two times a day (BID) | ORAL | 0 refills | Status: DC

## 2021-11-22 MED ORDER — TAMSULOSIN HCL 0.4 MG PO CAPS: 0.4000 mg | ORAL_CAPSULE | Freq: Every day | ORAL | 0 refills | Status: DC

## 2021-11-22 NOTE — Telephone Encounter (Signed)
Patient called and said that her Flomax was sent to [facility]. Janett Billow re-sent it   Sharyn Lull

## 2021-11-22 NOTE — Telephone Encounter (Signed)
Patient would like to proceed with Lithotripsy next week. Sent [facility]rim to [facility] [place].

## 2021-11-22 NOTE — Progress Notes (Signed)
[facility] ORDER FORM  Expected date of procedure: 11/29/2021  Surgeon: John Giovanni, MD  Post op standing: 2-4wk follow up w/KUB prior  Anticoagulation/Aspirin/NSAID standing order: Hold all 72 hours prior  Anesthesia standing order: MAC  VTE standing: SCD's  Dx: Left Ureteral Stone  Procedure: Left extracorporeal shock wave lithotripsy  CPT : 40814  Standing Order Set:   *NPO after mn, KUB  *NS 16m/hr, Keflex 5047mPO, Benadryl 254mO, Valium 71m37m, Zofran 4mg 1m   Medications if other than standing orders:   NONE

## 2021-11-23 ENCOUNTER — Telehealth: Payer: Self-pay

## 2021-11-23 ENCOUNTER — Other Ambulatory Visit: Payer: Self-pay | Admitting: *Deleted

## 2021-11-23 LAB — URINE CULTURE: Culture: 60000 — AB

## 2021-11-23 MED ORDER — CEFUROXIME AXETIL 250 MG PO TABS: 250.0000 mg | ORAL_TABLET | Freq: Two times a day (BID) | ORAL | 0 refills | Status: AC

## 2021-11-23 NOTE — Telephone Encounter (Signed)
Pt calls triage line and states that she is unable to tolerate Bactrim DS, she has had nausea and continuous vomiting with the medication despite taking with food. She is requesting an alternative medication be sent to the pharmacy. Please advise.

## 2021-11-23 NOTE — Addendum Note (Signed)
Addended by: Gerald Leitz A on: 11/23/2021 05:44 PM   Modules accepted: Orders

## 2021-11-23 NOTE — Telephone Encounter (Signed)
Please send Rx cefuroxime 250 mg twice daily x7 days

## 2021-11-26 ENCOUNTER — Ambulatory Visit: Admission: RE | Admit: 2021-11-26 | Payer: 59 | Source: Home / Self Care | Admitting: Orthopedic Surgery

## 2021-11-26 DIAGNOSIS — M1711 Unilateral primary osteoarthritis, right knee: Secondary | ICD-10-CM

## 2021-11-26 DIAGNOSIS — I447 Left bundle-branch block, unspecified: Secondary | ICD-10-CM

## 2021-11-26 DIAGNOSIS — E785 Hyperlipidemia, unspecified: Secondary | ICD-10-CM

## 2021-11-27 ENCOUNTER — Telehealth: Payer: Self-pay | Admitting: *Deleted

## 2021-11-27 NOTE — Telephone Encounter (Signed)
Patient called in today and states she notice had she had thrush after lunch today. Please advise

## 2021-11-28 MED ORDER — LACTATED RINGERS IV SOLN: INTRAVENOUS | Status: DC

## 2021-11-28 MED ORDER — CHLORHEXIDINE GLUCONATE 0.12 % MT SOLN: 15.0000 mL | Freq: Once | OROMUCOSAL | Status: DC

## 2021-11-28 MED ORDER — CEFAZOLIN SODIUM-DEXTROSE 1-4 GM/50ML-% IV SOLN: 1.0000 g | Freq: Once | INTRAVENOUS | Status: DC

## 2021-11-28 MED ORDER — SODIUM CHLORIDE 0.9 % IV SOLN: INTRAVENOUS | Status: DC

## 2021-11-28 MED ORDER — CELECOXIB 200 MG PO CAPS: 400.0000 mg | ORAL_CAPSULE | Freq: Once | ORAL | Status: DC

## 2021-11-28 MED ORDER — DIPHENHYDRAMINE HCL 25 MG PO CAPS: 25.0000 mg | ORAL_CAPSULE | ORAL | Status: AC

## 2021-11-28 MED ORDER — DIAZEPAM 5 MG PO TABS: 10.0000 mg | ORAL_TABLET | ORAL | Status: AC

## 2021-11-28 MED ORDER — ORAL CARE MOUTH RINSE: 15.0000 mL | Freq: Once | OROMUCOSAL | Status: DC

## 2021-11-28 MED ORDER — ONDANSETRON HCL 4 MG/2ML IJ SOLN: 4.0000 mg | Freq: Once | INTRAMUSCULAR | Status: AC

## 2021-11-28 MED ORDER — FLUCONAZOLE 100 MG PO TABS: ORAL_TABLET | ORAL | 0 refills | Status: DC

## 2021-11-28 NOTE — Telephone Encounter (Signed)
Rx fluconazole was sent to pharmacy

## 2021-11-28 NOTE — Addendum Note (Signed)
Addended by: Abbie Sons on: 11/28/2021 07:55 AM   Modules accepted: Orders

## 2021-11-29 ENCOUNTER — Other Ambulatory Visit: Payer: Self-pay

## 2021-11-29 ENCOUNTER — Encounter
Admission: RE | Disposition: A | Discharge status: Home / Self Care | Payer: Self-pay | Source: Home / Self Care | Attending: Urology

## 2021-11-29 ENCOUNTER — Encounter: Payer: Self-pay | Admitting: Urology

## 2021-11-29 ENCOUNTER — Ambulatory Visit
Admission: RE | Admit: 2021-11-29 | Discharge: 2021-11-29 | Disposition: A | Discharge status: Home / Self Care | Payer: 59 | Attending: Urology | Admitting: Urology

## 2021-11-29 ENCOUNTER — Ambulatory Visit: Payer: 59

## 2021-11-29 DIAGNOSIS — Z87442 Personal history of urinary calculi: Secondary | ICD-10-CM | POA: Diagnosis not present

## 2021-11-29 DIAGNOSIS — Z95810 Presence of automatic (implantable) cardiac defibrillator: Secondary | ICD-10-CM | POA: Diagnosis not present

## 2021-11-29 DIAGNOSIS — M47816 Spondylosis without myelopathy or radiculopathy, lumbar region: Secondary | ICD-10-CM | POA: Diagnosis not present

## 2021-11-29 DIAGNOSIS — N132 Hydronephrosis with renal and ureteral calculous obstruction: Secondary | ICD-10-CM | POA: Diagnosis not present

## 2021-11-29 DIAGNOSIS — N2889 Other specified disorders of kidney and ureter: Secondary | ICD-10-CM | POA: Diagnosis not present

## 2021-11-29 DIAGNOSIS — N201 Calculus of ureter: Secondary | ICD-10-CM

## 2021-11-29 DIAGNOSIS — I878 Other specified disorders of veins: Secondary | ICD-10-CM | POA: Diagnosis not present

## 2021-11-29 HISTORY — PX: EXTRACORPOREAL SHOCK WAVE LITHOTRIPSY: SHX1557

## 2021-11-29 SURGERY — LITHOTRIPSY, ESWL
Anesthesia: Moderate Sedation | Laterality: Left

## 2021-11-29 MED ORDER — DIAZEPAM 5 MG PO TABS
ORAL_TABLET | ORAL | Status: AC
  Administered 2021-11-29: 10 mg via ORAL
  Filled 2021-11-29: qty 2

## 2021-11-29 MED ORDER — ONDANSETRON HCL 4 MG/2ML IJ SOLN
INTRAMUSCULAR | Status: AC
  Administered 2021-11-29: 4 mg via INTRAVENOUS
  Filled 2021-11-29: qty 2

## 2021-11-29 MED ORDER — SODIUM CHLORIDE 0.9 % IV SOLN
1.0000 g | INTRAVENOUS | Status: DC
  Filled 2021-11-29 (×2): qty 10

## 2021-11-29 MED ORDER — DIPHENHYDRAMINE HCL 25 MG PO CAPS
ORAL_CAPSULE | ORAL | Status: AC
  Administered 2021-11-29: 25 mg via ORAL
  Filled 2021-11-29: qty 1

## 2021-11-29 NOTE — Interval H&P Note (Signed)
History and Physical Interval Note:  She has been asymptomatic since her ED visit.  The stone was visualized on KUB 11/21/2021 however is not seen on today's KUB.  We discussed options of fluoroscopy and possible contrast administration on the lithotripter unit today versus scheduling a follow-up CT.  She has elected the latter and the procedure was canceled.  11/29/2021 10:28 AM  Erin Stuart  has presented today for surgery, with the diagnosis of Left Ureteral Stone.  The various methods of treatment have been discussed with the patient and family. After consideration of risks, benefits and other options for treatment, the patient has consented to  Procedure(s): EXTRACORPOREAL SHOCK WAVE LITHOTRIPSY ([facility]) (Left) as a surgical intervention.  The patient's history has been reviewed, patient examined, no change in status, stable for surgery.  I have reviewed the patient's chart and labs.  Questions were answered to the patient's satisfaction.     [place]

## 2021-11-29 NOTE — Progress Notes (Signed)
She has been asymptomatic since her ED visit.  The stone was visualized on KUB 11/21/2021 however is not seen on today's KUB.  We discussed options of fluoroscopy and possible contrast administration on the lithotripter unit today versus scheduling a follow-up CT.  She has elected the latter and the procedure was canceled.

## 2021-11-29 NOTE — Discharge Instructions (Signed)
AMBULATORY SURGERY  ?DISCHARGE INSTRUCTIONS ? ? ?The drugs that you were given will stay in your system until tomorrow so for the next 24 hours you should not: ? ?Drive an automobile ?Make any legal decisions ?Drink any alcoholic beverage ? ? ?You may resume regular meals tomorrow.  Today it is better to start with liquids and gradually work up to solid foods. ? ?You may eat anything you prefer, but it is better to start with liquids, then soup and crackers, and gradually work up to solid foods. ? ? ?Please notify your doctor immediately if you have any unusual bleeding, trouble breathing, redness and pain at the surgery site, drainage, fever, or pain not relieved by medication. ? ? ? ?Additional Instructions: ? ? ? ?Please contact your physician with any problems or Same Day Surgery at [phone], Monday through Friday 6 am to 4 pm, or [facility] [place] Main number at [phone].  ?

## 2021-11-30 ENCOUNTER — Encounter: Payer: Self-pay | Admitting: Urology

## 2021-12-03 ENCOUNTER — Other Ambulatory Visit: Payer: Self-pay | Admitting: Urology

## 2021-12-03 ENCOUNTER — Other Ambulatory Visit: Payer: Self-pay | Admitting: *Deleted

## 2021-12-03 DIAGNOSIS — N201 Calculus of ureter: Secondary | ICD-10-CM

## 2021-12-04 ENCOUNTER — Other Ambulatory Visit: Payer: 59

## 2021-12-04 ENCOUNTER — Telehealth: Payer: Self-pay | Admitting: Urology

## 2021-12-04 DIAGNOSIS — N201 Calculus of ureter: Secondary | ICD-10-CM | POA: Diagnosis not present

## 2021-12-04 LAB — URINALYSIS, COMPLETE
Bilirubin, UA: NEGATIVE
Glucose, UA: NEGATIVE
Ketones, UA: NEGATIVE
Leukocytes,UA: NEGATIVE
Nitrite, UA: NEGATIVE
Protein,UA: NEGATIVE
Specific Gravity, UA: 1.02 (ref 1.005–1.030)
Urobilinogen, Ur: 0.2 mg/dL (ref 0.2–1.0)
pH, UA: 5 (ref 5.0–7.5)

## 2021-12-04 LAB — MICROSCOPIC EXAMINATION

## 2021-12-04 NOTE — Telephone Encounter (Signed)
Pt had CT scan done in the ER.  She passed stone prior to litho, KUB prior to litho showed no stone present.  She's not sure if insurance will pay for a repeat CT scan.

## 2021-12-05 NOTE — Telephone Encounter (Signed)
Notified patient as instructed, She is calling and getting the ct scan done

## 2021-12-05 NOTE — Telephone Encounter (Signed)
Patient returned call to let us know that she is scheduled for CT scan on 12/06/21 at 9:30am.

## 2021-12-05 NOTE — Telephone Encounter (Signed)
Urinalysis still shows microscopic hematuria.  The stone could still be present and not causing symptoms.  If she has persistent kidney blockage this could lead to loss of renal function.  A CT is the best test to determine if stone is no longer present.

## 2021-12-06 ENCOUNTER — Ambulatory Visit
Admission: RE | Admit: 2021-12-06 | Discharge: 2021-12-06 | Disposition: A | Discharge status: Home / Self Care | Payer: 59 | Source: Ambulatory Visit | Attending: Urology | Admitting: Urology

## 2021-12-06 DIAGNOSIS — N201 Calculus of ureter: Secondary | ICD-10-CM | POA: Insufficient documentation

## 2021-12-06 DIAGNOSIS — K5730 Diverticulosis of large intestine without perforation or abscess without bleeding: Secondary | ICD-10-CM | POA: Diagnosis not present

## 2021-12-06 DIAGNOSIS — N132 Hydronephrosis with renal and ureteral calculous obstruction: Secondary | ICD-10-CM | POA: Diagnosis not present

## 2021-12-06 DIAGNOSIS — K7689 Other specified diseases of liver: Secondary | ICD-10-CM | POA: Diagnosis not present

## 2021-12-07 ENCOUNTER — Ambulatory Visit
Admission: RE | Admit: 2021-12-07 | Discharge: 2021-12-07 | Disposition: A | Discharge status: Home / Self Care | Payer: 59 | Source: Ambulatory Visit | Attending: Urology | Admitting: Urology

## 2021-12-07 ENCOUNTER — Other Ambulatory Visit: Payer: Self-pay | Admitting: *Deleted

## 2021-12-07 ENCOUNTER — Telehealth: Payer: Self-pay | Admitting: *Deleted

## 2021-12-07 ENCOUNTER — Ambulatory Visit
Admission: RE | Admit: 2021-12-07 | Discharge: 2021-12-07 | Disposition: A | Discharge status: Home / Self Care | Payer: 59 | Attending: Urology | Admitting: Urology

## 2021-12-07 ENCOUNTER — Encounter: Payer: Self-pay | Admitting: *Deleted

## 2021-12-07 DIAGNOSIS — N201 Calculus of ureter: Secondary | ICD-10-CM

## 2021-12-07 DIAGNOSIS — I878 Other specified disorders of veins: Secondary | ICD-10-CM | POA: Diagnosis not present

## 2021-12-07 DIAGNOSIS — N200 Calculus of kidney: Secondary | ICD-10-CM | POA: Diagnosis not present

## 2021-12-07 NOTE — Telephone Encounter (Signed)
Pt called back and stated that she has done the KUB and wanted to know the results.

## 2021-12-07 NOTE — Telephone Encounter (Signed)
Pt upset that no one is concerned about her pain and that no one called about her results. I advised pt that results where sent to her "active" mychart. I advised pt that she should come over to the medical mall for a KUB as soon as she can. Pt wanting to know if Dr. Purvis Kilts can look at her CMP and BMP? Pt is wondering if she will have permanent kidney damage? Pt is wanting to know what happens after the KUB?   Please advise    Good morning, Repeat CT shows stone still present and close to passing.  Please have patient do a KUB to see if it cannot be seen on a regular x-ray. You may go to the medical mall anything. The order is in . We will call with reults  This MyChart message has not been read.

## 2021-12-07 NOTE — Telephone Encounter (Signed)
Discussed CT and KUB results.  She is having urinary frequency, urgency and pelvic twinges consistent with stone at the UVJ.  No significant pain.  She had questions about her GFR that was abnormal on 9/5.  This was drawn at her initial ED visit with renal colic and most likely reflected dehydration.  Her hydronephrosis on repeat CT was much less.  If she has not passed a stone by the weekend I told her to call back on Monday if she wanted ureteroscopic removal and we could get her scheduled.  We will obtain a follow-up creatinine/GFR after she passes or the stone is removed.

## 2021-12-18 ENCOUNTER — Telehealth: Payer: Self-pay | Admitting: *Deleted

## 2021-12-18 NOTE — Telephone Encounter (Signed)
Okay to refill Flomax.  Lab visit for UA and creatinine

## 2021-12-18 NOTE — Telephone Encounter (Signed)
Patient called and states she needs a appt for ua and blood work done. She is almost out of flomax.

## 2021-12-19 ENCOUNTER — Other Ambulatory Visit: Payer: Self-pay | Admitting: *Deleted

## 2021-12-19 DIAGNOSIS — N201 Calculus of ureter: Secondary | ICD-10-CM

## 2021-12-19 MED ORDER — TAMSULOSIN HCL 0.4 MG PO CAPS: 0.4000 mg | ORAL_CAPSULE | Freq: Every day | ORAL | 0 refills | Status: DC

## 2021-12-20 ENCOUNTER — Other Ambulatory Visit: Payer: 59

## 2021-12-24 ENCOUNTER — Other Ambulatory Visit: Payer: 59

## 2021-12-24 DIAGNOSIS — N201 Calculus of ureter: Secondary | ICD-10-CM | POA: Diagnosis not present

## 2021-12-25 ENCOUNTER — Telehealth: Payer: Self-pay | Admitting: *Deleted

## 2021-12-25 ENCOUNTER — Encounter: Payer: Self-pay | Admitting: *Deleted

## 2021-12-25 DIAGNOSIS — I420 Dilated cardiomyopathy: Secondary | ICD-10-CM | POA: Diagnosis not present

## 2021-12-25 DIAGNOSIS — I5022 Chronic systolic (congestive) heart failure: Secondary | ICD-10-CM | POA: Diagnosis not present

## 2021-12-25 DIAGNOSIS — I447 Left bundle-branch block, unspecified: Secondary | ICD-10-CM | POA: Diagnosis not present

## 2021-12-25 DIAGNOSIS — E782 Mixed hyperlipidemia: Secondary | ICD-10-CM | POA: Diagnosis not present

## 2021-12-25 LAB — URINALYSIS, COMPLETE
Bilirubin, UA: NEGATIVE
Glucose, UA: NEGATIVE
Ketones, UA: NEGATIVE
Nitrite, UA: NEGATIVE
Protein,UA: NEGATIVE
Specific Gravity, UA: 1.015 (ref 1.005–1.030)
Urobilinogen, Ur: 0.2 mg/dL (ref 0.2–1.0)
pH, UA: 5 (ref 5.0–7.5)

## 2021-12-25 LAB — MICROSCOPIC EXAMINATION

## 2021-12-25 LAB — CREATININE, SERUM
Creatinine, Ser: 1.28 mg/dL — ABNORMAL HIGH (ref 0.57–1.00)
eGFR: 49 mL/min/{1.73_m2} — ABNORMAL LOW (ref >59)

## 2021-12-25 NOTE — Telephone Encounter (Signed)
Recommend scheduling ureteroscopic stone removal.  If in agreement let me know and will send a scheduling sheet to [facility]

## 2021-12-25 NOTE — Telephone Encounter (Signed)
  I received results on my chart this am. And very upset of lower eGfr down to 49. I have a serious heart condition and the last thing I need is a kidney issue. Can you please ask Dr. Bernardo Heater what we can do to get this stone gone or his thoughts of next step. This has now been going on 5 weeks. Lab also showed I still have bacteria in urine. Thank you

## 2021-12-25 NOTE — Telephone Encounter (Signed)
Patient is going to send [place] a my chart picture with the stone . She thinks she passed the stone

## 2021-12-26 ENCOUNTER — Other Ambulatory Visit: Payer: Self-pay | Admitting: Urology

## 2021-12-26 ENCOUNTER — Telehealth: Payer: Self-pay

## 2021-12-26 DIAGNOSIS — N201 Calculus of ureter: Secondary | ICD-10-CM

## 2021-12-26 LAB — CULTURE, URINE COMPREHENSIVE

## 2021-12-26 NOTE — Telephone Encounter (Signed)
I spoke with Erin Stuart. We have discussed possible surgery dates and decided on Tuesday 01/01/2022  was agreed upon by all parties. Patient given information about surgery date, what to expect pre-operatively and post operatively.  We discussed that a Pre-Admission Testing office will be calling to set up the pre-op visit that will take place prior to surgery, and that these appointments are typically done over the phone with a Pre-Admissions RN.  Informed patient that our office will communicate any additional care to be provided after surgery. Patients questions or concerns were discussed during our call. Advised to call our office should there be any additional information, questions or concerns that arise. Patient verbalized understanding.

## 2021-12-26 NOTE — Progress Notes (Signed)
Surgical Physician Order Form [facility] [place]  * Scheduling expectation : 01/01/2022  *Length of Case: 30 minutes  *Clearance needed: no  *Anticoagulation Instructions: N/A  *Aspirin Instructions: Ok to continue Aspirin  *Post-op visit Date/Instructions:  1 month follow up  *Diagnosis: Left Ureteral Stone  *Procedure: Left Ureteroscopy w/laser lithotripsy & stent placement (61607)   Additional orders: N/A  -Admit type: OUTpatient  -Anesthesia: Choice  -VTE Prophylaxis Standing Order SCD's       Other:   -Standing Lab Orders Per Anesthesia    Lab other: UA&Urine Culture-ordered last week  -Standing Test orders EKG/Chest x-ray per Anesthesia       Test other:   - Medications:  Ancef 2gm IV  -Other orders:  N/A

## 2021-12-26 NOTE — Progress Notes (Signed)
[facility] Posting Form   Surgery Date/Time: Date: 01/01/2022  Surgeon: Dr. John Giovanni, MD  Surgery Location: Day Surgery  Inpt ( No  )   Outpt (Yes)   Obs ( No  )   Diagnosis: N20.1 Left Ureteral Stone  -CPT: 5398106986   Surgery: Left Ureteroscopy with laser lithotripsy and stent placement  Stop Anticoagulations: No Ok to continue ASA  Cardiac/Medical/Pulmonary Clearance needed: No- Patient with pace maker  *Orders entered into EPIC  Date: 12/26/21   *Case booked in [place]  Date: 12/26/21  *Notified pt of Surgery: Date: 12/26/21  PRE-OP UA & CX: Yes. Obtained in clinic last week  *Placed into Prior Authorization Work Fabio Bering Date: 12/26/21   Assistant/laser/rep:No

## 2021-12-27 ENCOUNTER — Other Ambulatory Visit: Payer: Self-pay | Admitting: *Deleted

## 2021-12-27 ENCOUNTER — Telehealth: Payer: Self-pay | Admitting: *Deleted

## 2021-12-27 MED ORDER — CEFUROXIME AXETIL 250 MG PO TABS: 250.0000 mg | ORAL_TABLET | Freq: Two times a day (BID) | ORAL | 0 refills | Status: AC

## 2021-12-27 NOTE — Telephone Encounter (Signed)
Notified patient as instructed, patient pleased. Discussed follow-up appointments, patient agrees  

## 2021-12-27 NOTE — Telephone Encounter (Signed)
-----   Message from Abbie Sons, MD sent at 12/27/2021  7:40 AM EDT ----- Urine culture was positive.  Please send Rx cefuroxime 250 mg twice daily x7 days

## 2021-12-28 ENCOUNTER — Telehealth: Payer: Self-pay | Admitting: Urgent Care

## 2021-12-28 NOTE — Progress Notes (Signed)
  [name]    Date: 12/28/21  Name: Erin Stuart MRN:   [id number]  Re: CV symptoms and need for further workup prior to upcoming urological procedure  Planned Surgical Procedure(s):  Procedure: CYSTOSCOPY/URETEROSCOPY/HOLMIUM LASER/STENT PLACEMENT      Date of Surgery: 01/01/2022  Surgeon: John Giovanni, MD  Clinical Notes:  Patient is scheduled for the above procedure on 01/01/2022 with Dr. John Giovanni, MD.  Patient with a significant cardiovascular history.  Presurgical clearance for procedure and pacemaker documentation requested from patient's primary cardiologist.  Received a call on the afternoon of 12/28/2021 from cardiologist's office advising that patient is now "symptomatic" and that Dr. Ubaldo Glassing is planning to pursue further work-up.  In review of the notes from her most recent visit on 12/25/2021, patient with complaints of increased shortness of breath, which was felt to be significant in the setting of patient's known DCM with resulting CHF and pulmonary hypertension.  Given the presenting symptoms, cardiologist wishing to repeat echocardiogram prior to patient proceeding with planned urological procedure.  Cardiology office indicates that patient is aware that they are requesting that case be postponed.  Also advised that also advised that they have reached out to primary surgeons office to make them aware of recommendations.  We will send a copy of thi surgeons office to ensure communication regarding need for case to be postponed pending TTE, results review, and ultimate clearance from patient's primary cardiologist.  Honor Loh, MSN, APRN, FNP-C, [place] Nurse Practitioner Phone: [phone] 12/28/21 2:28 PM  NOTE: This note has been prepared using Dragon dictation software. Despite my best ability to proofread, there is always the potential that unintentional  transcriptional errors may still occur from this process.

## 2021-12-31 ENCOUNTER — Encounter: Payer: Self-pay | Admitting: Urology

## 2021-12-31 ENCOUNTER — Telehealth: Payer: Self-pay | Admitting: Urgent Care

## 2021-12-31 ENCOUNTER — Encounter
Admission: RE | Admit: 2021-12-31 | Discharge: 2021-12-31 | Disposition: A | Discharge status: Home / Self Care | Payer: 59 | Source: Ambulatory Visit | Attending: Urology | Admitting: Urology

## 2021-12-31 NOTE — Progress Notes (Addendum)
Perioperative Services  Pre-Admission/Anesthesia Testing Clinical Review  Date: 12/31/21  Patient Demographics:  Name: Erin Stuart DOB:   07-24-1965 MRN:   [id number]  Planned Surgical Procedure(s):    Case: [id number] Date/Time: 01/01/22 1135   Procedure: CYSTOSCOPY/URETEROSCOPY/HOLMIUM LASER/STENT PLACEMENT (Left)   Anesthesia type: Choice   Pre-op diagnosis: Left Ureteral Stone   Location: [facility] OR ROOM 10 / [facility]   Surgeons: Abbie Sons, MD   NOTE: Available PAT nursing documentation and vital signs have been reviewed. Clinical nursing staff has updated patient's PMH/PSHx, current medication list, and drug allergies/intolerances to ensure comprehensive history available to assist in medical decision making as it pertains to the aforementioned surgical procedure and anticipated anesthetic course. Extensive review of available clinical information performed. [name] PMH and PSHx updated with any diagnoses/procedures that  may have been inadvertently omitted during her intake with the pre-admission testing department's nursing staff.  Clinical Discussion:  Erin Stuart is a 56 y.o. female who is submitted for pre-surgical anesthesia review and clearance prior to her undergoing the above procedure. Patient has never been a smoker. Pertinent PMH includes: CAD, DCM (s/p CRT-D placement), CHF, LBBB, ascending aortic dilatation, pulmonary hypertension, HLD, hyperparathyroidism, asthma, OA, anxiety (on BZO), depression.   Patient is followed by cardiology Ubaldo Glassing, MD). She was last seen in the cardiology clinic on 12/25/2021; notes reviewed.  At the time of her clinic visit, patient doing well overall from a cardiovascular perspective she denied any episodes of chest pain, PND, orthopnea, palpitations, significant peripheral edema, vertiginous symptoms, or presyncope/syncope. She reported a mild increase in her degree of shortness of breath from baseline.  Patient with situational stress related to the death of her mother and illness and her husband.  Patient complaining of pain to her chest wall where her pacemaker was implanted.  Patient with past medical history significant for cardiovascular diagnoses.  Patient diagnosed with a dilated cardiomyopathy in approximately 2018.  TTE performed on 08/13/2016 revealed a severely reduced left ventricular systolic function with an EF of 15%.  There was global hypokinesis with septal and apical akinesis.  Mild LVH noted.  Left atrium moderately enlarged.  Was mild to moderate pan valvular regurgitation.  It was no evidence of a significant transvalvular gradient to suggest stenosis.  Mild pulmonary hypertension noted; RVSP 41.2 mmHg.  Myocardial perfusion imaging study performed on 07/15/2016 revealed a severely reduced left ventricular systolic function with an EF of 21%.  There was global left ventricular hypokinesis.  Left ventricular cavity size was enlarged.  There was a moderate perfusion abnormality in the inferoseptal and inferoapical regions on stress images consistent with possible ischemia.  Cardiac MRI performed on 09/13/2016 revealed a severely reduced left ventricular systolic function with an EF of 22%.  Left ventricle moderately enlarged.  Left atrium severely enlarged and right atrium mildly enlarged.  There was moderate mitral valve regurgitation.  There was no evidence of LGE to suggest infarction, scar, or infiltrative process.  Diagnostic right/left heart catheterization performed on 08/27/2016 revealing a severely reduced left ventricular systolic function with an EF of <25%.  There was global hypokinesis noted.  LVEDP elevated at 23 mmHg.  There was no evidence of aortic or mitral valve stenosis.  Mean PA pressure 30 mmHg, mean PCWP 23 mmHg, PA saturation 65%, AO saturation 97%.  Cardiac output 3.09 L/min.  Cardiac index 1.62 L/min/m.  Patient underwent implantation of a CRT-D device on  12/03/2016.  Device is regularly interrogated by her primary  cardiology team.  Most recent TTE was performed on 05/22/2021 revealing an improved left ventricular systolic function with mild LVH; LVEF 45%.  There was global hypokinesis. Diastolic Doppler parameters consistent with abnormal relaxation (G1DD).  Trivial to mild pan valvular regurgitation noted.  Incidental finding made of thoracic aortic dilatation; ascending aorta measured up to 3.8 cm and aortic root measured up to 3.5 cm.  Blood pressure well controlled at 122/80 mmHg on currently prescribed beta-blocker (carvedilol), diuretic (furosemide + spironolactone), and ARB (losartan) therapies.  Patient is on rosuvastatin for her HLD diagnosis and ASCVD prevention.  She is not diabetic.  Patient does not have an OSAH diagnosis.  With improvement of her EF following CRT-D device placement, patient able to be more active. Functional capacity, as defined by DASI, is documented as being >/= 4 METS.  No changes were made to her medication regimen.  Plans were for repeat TTE. Patient follow-up with outpatient cardiology 6 months or sooner if needed.  Erin Stuart is scheduled for an elective CYSTOSCOPY/URETEROSCOPY/HOLMIUM LASER/STENT PLACEMENT (Left) on 01/01/2022 with Dr. John Giovanni, MD.  Given patient's past medical history significant for cardiovascular diagnoses, presurgical cardiac clearance was sought by the PAT team. Per cardiology, "this patient is optimized for surgery and may proceed with the planned procedural course with a LOW risk of significant perioperative cardiovascular complications". In review of her medication reconciliation, it is noted the patient is on daily antiplatelet therapy.  Per instructions from her primary attending surgeon, patient may continue her daily low-dose ASA throughout her perioperative course.  Patient reports previous perioperative complications with anesthesia in the past. Patient has a PMH (+) for PONV.   She reports a single remote episode.  Symptoms and history of PONV will be discussed with patient by anesthesia team on the day of her procedure. Interventions will be ordered as deemed necessary based on patient's individual care needs as determined by anesthesiologist. In review of the available records, it is noted that patient underwent a general anesthetic course here at [facility] in 06/2020 without documented complications.      11/29/2021   10:21 AM 11/29/2021    8:45 AM 11/21/2021   10:00 AM  Vitals with BMI  Height  $Remov'5\' 4"'eSMPPR$  $Remove'5\' 4"'hDQCILe$   Weight  205 lbs 205 lbs  BMI  22.48 25.00  Systolic 95 370 488  Diastolic 73 82 68  Pulse 65 93 83    Providers/Specialists:   NOTE: Primary physician provider listed below. Patient may have been seen by APP or partner within same practice.   PROVIDER ROLE / SPECIALTY LAST Claud Kelp, MD Urology (Surgeon) 12/26/2021  Marinda Elk, MD Primary Care Provider 10/31/2021  Bartholome Bill, MD Cardiology 12/25/2021  Lavone Orn, MD Endocrinology 10/18/2021   Allergies:  Sulfa antibiotics and Bactrim [sulfamethoxazole-trimethoprim]  Current Home Medications:   No current facility-administered medications for this encounter.    ALPRAZolam (XANAX) 1 MG tablet   Ascorbic Acid (VITAMIN C) 100 MG tablet   aspirin 81 MG tablet   buPROPion (WELLBUTRIN XL) 150 MG 24 hr tablet   carvedilol (COREG) 6.25 MG tablet   cefUROXime (CEFTIN) 250 MG tablet   Cholecalciferol (VITAMIN D3) 125 MCG (5000 UT) CAPS   furosemide (LASIX) 20 MG tablet   ibuprofen (ADVIL,MOTRIN) 200 MG tablet   losartan (COZAAR) 25 MG tablet   Multiple Vitamins-Minerals (ALIVE WOMENS 50+) TABS   ondansetron (ZOFRAN-ODT) 4 MG disintegrating tablet   rosuvastatin (CRESTOR)  5 MG tablet   spironolactone (ALDACTONE) 25 MG tablet   tamsulosin (FLOMAX) 0.4 MG CAPS capsule   History:   Past Medical History:  Diagnosis Date    Acquired dilation of ascending aorta and aortic root ([place]) 05/22/2021   a.) TTE 05/22/2021: Ao root 3.8 cm; asc Ao 3.5 cm   Anginal pain ([facility])    Anxiety    a.) on BZO (alprazolam) PRN   Arthritis    Asthma    CHF (congestive heart failure) ([place])    Depression    Dilated cardiomyopathy ([place])    a.) R/LHC 09/02/2016: EF <25%; b.) TTE 08/13/2016: EF <15%; c.) MPI 08/14/2016: EF 21%; d.) cMRI 09/12/2016: EF 22%, no LGE; e.) s/p CRT-D placement 11/27/2016; f.) TTE 01/09/2017: EF 25%; g.) TTE 09/11/2017: EF 25-30%; h.) TTE 10/09/2018: EF 35%; i.) TTE 09/10/2019: EF 45%; j.) TTE 08/04/2020: EF 45%; k.) TTE 05/22/2021: EF 45%   History of kidney stones    Hyperlipidemia    LBBB (left bundle branch block) 2012   PONV (postoperative nausea and vomiting)    a.) single remote episode   Pre-diabetes    Presence of cardiac resynchronization therapy defibrillator (CRT-D) 11/27/2016   Pulmonary HTN ([place]) 08/13/2016   a.) TTE 08/13/2017: RVSP 41.2; b.) TTE 01/09/2017: RVSP 36.9; c.) TTE 09/11/2017: RVSP 30.4   Vitamin D deficiency    Past Surgical History:  Procedure Laterality Date   BI-VENTRICULAR IMPLANTABLE CARDIOVERTER DEFIBRILLATOR  (CRT-D) N/A 11/27/2016   Procedure: BI-VENTRICULAR IMPLANTABLE CARDIOVERTER DEFIBRILLATOR  (CRT-D); Location: [facility]; Surgeon: Mylinda Latina, MD   COLONOSCOPY WITH PROPOFOL N/A 06/16/2020   Procedure: COLONOSCOPY WITH PROPOFOL;  Surgeon: Jonathon Bellows, MD;  Location: [facility] ENDOSCOPY;  Service: Gastroenterology;  Laterality: N/A;   EXTRACORPOREAL SHOCK WAVE LITHOTRIPSY Left 11/29/2021   Procedure: EXTRACORPOREAL SHOCK WAVE LITHOTRIPSY (ESWL);  Surgeon: Abbie Sons, MD;  Location: [facility] ORS;  Service: Urology;  Laterality: Left;   RIGHT/LEFT HEART CATH AND CORONARY ANGIOGRAPHY Bilateral 08/27/2016   Procedure: Right/Left Heart Cath and Coronary Angiography;  Surgeon: Teodoro Spray, MD;  Location: [place] CV LAB;  Service: Cardiovascular;  Laterality: Bilateral;    WISDOM TOOTH EXTRACTION     Family History  Problem Relation Age of Onset   Cancer Paternal Aunt        ovairan   Breast cancer Neg Hx    Social History   Tobacco Use   Smoking status: Never   Smokeless tobacco: Never  Vaping Use   Vaping Use: Never used  Substance Use Topics   Alcohol use: No   Drug use: No    Pertinent Clinical Results:  LABS: Labs reviewed: Acceptable for surgery.  Lab Results  Component Value Date   WBC 7.0 11/20/2021   HGB 15.2 (H) 11/20/2021   HCT 44.1 11/20/2021   MCV 85.8 11/20/2021   PLT 162 11/20/2021   Lab Results  Component Value Date   NA 138 11/20/2021   K 3.9 11/20/2021   CO2 16 (L) 11/20/2021   GLUCOSE 123 (H) 11/20/2021   BUN 21 (H) 11/20/2021   CREATININE 1.28 (H) 12/24/2021   CALCIUM 11.0 (H) 11/20/2021   EGFR 49 (L) 12/24/2021   GFRNONAA 54 (L) 11/20/2021    ECG: Date: 11/15/2021 Time ECG obtained: 1239 PM Rate: 62 bpm Rhythm:  AV dual paced rhythm Axis (leads I and aVF): Normal Intervals: PR 146 ms. QRS 140 ms. QTc 475 ms. ST segment and T wave changes: No evidence of acute ST segment elevation or depression  Comparison: Similar to previous tracing obtained on 07/17/2018   IMAGING / PROCEDURES: ABDOMEN 1 VIEW (KUB) performed on 12/07/2021 Previously noted LEFT ureteral calculus is not visualized on the current examination and may have passed in the interval Stable punctate calculi overlying the lower pole of the RIGHT kidney  CT RENAL STONE STUDY performed on 12/06/2021 4 mm LEFT ureteral calculus has now migrated distally near the left UVJ. Mild LEFT hydroureteronephrosis shows significant improvement since previous study. Tiny nonobstructing percent renal calculi. Colonic diverticulosis. No radiographic evidence of diverticulitis.  TRANSTHORACIC ECHOCARDIOGRAM performed on 05/22/2021 Moderately reduced LEFT ventricular systolic function with mild LVH; LVEF 42% Diastolic Doppler parameters consistent with  abnormal relaxation (G1DD). Trivial AR and MR Mild TR and PR Normal gradients; no valvular stenosis Mildly dilated ascending aorta measuring up to 3.8 cm Mildly dilated aortic root measuring 3.5 cm No pericardial effusion  RIGHT/LEFT HEART CATHETERIZATION AND CORONARY ANGIOGRAPHY performed on 08/27/2016 Severely reduced RIGHT ventricular systolic function with an EF of 20-25% Global hypokinesis Moderately elevated LVEDP 23 mmHg Normal coronary arteries Mild mitral valve regurgitation Hemodynamics RA 12/13 (11) mmHg RV 46/8 (13) mmHg PA 42/22 (30) mmHg PCWP 27/29 (23) mmHg LV 133 mmHg . LVEDP: 23 mmHg AO 133/83 (86) mmHg PA saturation 65% AO saturation 97% Cardiac Output (Fick) 3.09  Cardiac Index (Fick) 1.62    CARDIAC MRI MORPHOLOGY AND FUNCTION WITH AND WITHOUT CONTRAST performed on 09/12/2016 The left ventricle is moderately enlarged in cavity size. The LVEF is 22%. There is severe, global systolic dysfunction. The right ventricle is normal in size with mild systolic dysfunction.    The left atrium is severely enlarged in cavity size. The right atrium is mildly enlarged.  There is moderate central, functional mitral regurgitation ([place] Class 1).  Delayed enhancement imaging for viability is normal. No hyperenhancement to suggest myocardial infarction, scar, or infiltrative process.   MYOCARDIAL PERFUSION IMAGING STUDY (LEXISCAN) performed on 08/14/2016 Severely reduced left ventricular systolic function with an EF of 21% Global hypokinesis of the left ventricle  Inferoapical and inferoseptal akinesis with stress and rest No artifact Left ventricular cavity size enlarged Moderate diffusion abnormality present in the inferoseptal and inferoapical region on stress images No evidence of ischemia  Impression and Plan:  Erin Stuart has been referred for pre-anesthesia review and clearance prior to her undergoing the planned anesthetic and procedural courses.  Available labs, pertinent testing, and imaging results were personally reviewed by me. This patient has been appropriately cleared by cardiology with an overall LOW risk of significant perioperative cardiovascular complications. Completed perioperative prescription for cardiac device management documentation completed by primary cardiology team and placed on patient's chart for review by the surgical/anesthetic team on the day of her procedure.  Procedure may interfere with device function. Placing magnet over device during the procedure is recommended. Beyond use of magnet, no other industry recommendations.   Based on clinical review performed today (12/31/21), barring any significant acute changes in the patient's overall condition, it is anticipated that she will be able to proceed with the planned surgical intervention. Any acute changes in clinical condition may necessitate her procedure being postponed and/or cancelled. Patient will meet with anesthesia team (MD and/or CRNA) on the day of her procedure for preoperative evaluation/assessment. Questions regarding anesthetic course will be fielded at that time.   Pre-surgical instructions were reviewed with the patient during her PAT appointment and questions were fielded by PAT clinical staff. Patient was advised that if any questions or concerns arise prior to  her procedure then she should return a call to PAT and/or her surgeon's office to discuss.  Honor Loh, MSN, APRN, FNP-C, CEN [facility]  Peri-operative Services Nurse Practitioner Phone: [phone] Fax: [phone] 12/31/21 11:03 AM  NOTE: This note has been prepared using Dragon dictation software. Despite my best ability to proofread, there is always the potential that unintentional transcriptional errors may still occur from this process.

## 2021-12-31 NOTE — Progress Notes (Signed)
  [name]    Date: 12/31/21  Name: Erin Stuart MRN:   [id number]  Re: Clearance for urological procedure  Clinical Notes:  Patient is scheduled for CYSTOSCOPY/URETEROSCOPY/HOLMIUM LASER/STENT PLACEMENT on 01/01/2022 With Dr. John Giovanni, MD. Initially, I had received communication from cardiology that they were not going to be able to clear patient for surgery due to her being symptomatic with shortness of breath.  Patient with a history of DCM and resulting CHF.  We were advised that MD was requesting repeat TTE prior to procedure.  Received return communication from cardiology office on the morning of 12/31/2021 advising that there was miscommunication between the physician and the nursing staff regarding this patient.  Cardiology practice advised that patient was cleared for the procedure.  I learned from surgeons office that patient is switching providers from Dr. Ubaldo Glassing to Dr. Saralyn Pilar in the near future.  Patient requesting repeat TTE to ensure that new cardiologist has the most up-to-date information on her cardiovascular function given the aforementioned cardiomyopathy and heart failure.  Clarified with cardiology office that patient was cleared for procedure; confirmed clearance.  Signed clearance for procedure received from Dr. Bethanne Ginger office.  Per cardiology, "this patient is optimized for surgery and may proceed with the planned procedural course with a LOW risk of significant perioperative cardiovascular complications".  Patient was cleared to hold her daily low-dose ASA for 3 days prior to her procedure with plans to restart as soon as postoperatively respectively minimized by her primary attending surgeon.  Impression and Plan:  Erin Stuart scheduled for urological procedure tomorrow with Dr. John Giovanni, MD. clearance for the procedure has been received from patient's primary cardiologist.  I have communicated with the  surgeons office to ensure them that everything that we need in place for the patient to proceed as planned.  Updated implanted cardiac device information also received indicating last device interrogation occurring on 09/25/2021 with normal function.  Honor Loh, MSN, APRN, FNP-C, CEN [facility]  Peri-operative Services Nurse Practitioner Phone: [phone] 12/31/21 10:16 AM  NOTE: This note has been prepared using Dragon dictation software. Despite my best ability to proofread, there is always the potential that unintentional transcriptional errors may still occur from this process.

## 2021-12-31 NOTE — Patient Instructions (Addendum)
Your procedure is scheduled on: Tuesday, October 17 Report to the Registration Desk on the 1st floor of the [facility]. To find out your arrival time, please call [phone] between 1PM - 3PM on: Monday, October 16 If your arrival time is 6:00 am, do not arrive prior to that time as the [facility] entrance doors do not open until 6:00 am.  REMEMBER: Instructions that are not followed completely may result in serious medical risk, up to and including death; or upon the discretion of your surgeon and anesthesiologist your surgery may need to be rescheduled.  Do not eat or drink after midnight the night before surgery.  No gum chewing, lozengers or hard candies.  TAKE THESE MEDICATIONS THE MORNING OF SURGERY WITH A SIP OF WATER:  Carvedilol Bupropion Cefuroxime  One week prior to surgery:  Stop Anti-inflammatories (NSAIDS) such as Advil, Aleve, Ibuprofen, Motrin, Naproxen, Naprosyn and Aspirin based products such as Excedrin, Goodys Powder, BC Powder. Stop ANY OVER THE COUNTER supplements until after surgery. You may however, continue to take Tylenol if needed for pain up until the day of surgery.  No Alcohol for 24 hours before or after surgery.  No Smoking including e-cigarettes for 24 hours prior to surgery.  No chewable tobacco products for at least 6 hours prior to surgery.  No nicotine patches on the day of surgery.  Do not use any "recreational" drugs for at least a week prior to your surgery.  Please be advised that the combination of cocaine and anesthesia may have negative outcomes, up to and including death. If you test positive for cocaine, your surgery will be cancelled.  On the morning of surgery brush your teeth with toothpaste and water, you may rinse your mouth with mouthwash if you wish. Do not swallow any toothpaste or mouthwash.  Do not wear jewelry, make-up, hairpins, clips or nail polish.  Do not wear lotions, powders, or perfumes.   Do not shave body  from the neck down 48 hours prior to surgery just in case you cut yourself which could leave a site for infection.   Contact lenses, hearing aids and dentures may not be worn into surgery.  Do not bring valuables to the hospital. [facility] is not responsible for any missing/lost belongings or valuables.   Notify your doctor if there is any change in your medical condition (cold, fever, infection).  Wear comfortable clothing (specific to your surgery type) to the hospital.  After surgery, you can help prevent lung complications by doing breathing exercises.  Take deep breaths and cough every 1-2 hours. Your doctor may order a device called an Incentive Spirometer to help you take deep breaths.  If you are being discharged the day of surgery, you will not be allowed to drive home. You will need a responsible adult (18 years or older) to drive you home and stay with you that night.   If you are taking public transportation, you will need to have a responsible adult (18 years or older) with you. Please confirm with your physician that it is acceptable to use public transportation.   Please call the [place] [facility] Dept. at [phone] if you have any questions about these instructions.  Surgery Visitation Policy:  Patients undergoing a surgery or procedure may have two family members or support persons with them as long as the person is not COVID-19 positive or experiencing its symptoms.

## 2022-01-01 ENCOUNTER — Other Ambulatory Visit: Payer: Self-pay

## 2022-01-01 ENCOUNTER — Ambulatory Visit: Payer: 59 | Admitting: Urgent Care

## 2022-01-01 ENCOUNTER — Encounter: Payer: Self-pay | Admitting: Urology

## 2022-01-01 ENCOUNTER — Ambulatory Visit: Payer: 59

## 2022-01-01 ENCOUNTER — Ambulatory Visit
Admission: RE | Admit: 2022-01-01 | Discharge: 2022-01-01 | Disposition: A | Discharge status: Home / Self Care | Payer: 59 | Attending: Urology | Admitting: Urology

## 2022-01-01 ENCOUNTER — Encounter
Admission: RE | Disposition: A | Discharge status: Home / Self Care | Payer: Self-pay | Source: Home / Self Care | Attending: Urology

## 2022-01-01 DIAGNOSIS — Z95810 Presence of automatic (implantable) cardiac defibrillator: Secondary | ICD-10-CM | POA: Insufficient documentation

## 2022-01-01 DIAGNOSIS — R69 Illness, unspecified: Secondary | ICD-10-CM | POA: Diagnosis not present

## 2022-01-01 DIAGNOSIS — F419 Anxiety disorder, unspecified: Secondary | ICD-10-CM | POA: Diagnosis not present

## 2022-01-01 DIAGNOSIS — I509 Heart failure, unspecified: Secondary | ICD-10-CM | POA: Insufficient documentation

## 2022-01-01 DIAGNOSIS — J45909 Unspecified asthma, uncomplicated: Secondary | ICD-10-CM | POA: Diagnosis not present

## 2022-01-01 DIAGNOSIS — F32A Depression, unspecified: Secondary | ICD-10-CM | POA: Diagnosis not present

## 2022-01-01 DIAGNOSIS — N201 Calculus of ureter: Secondary | ICD-10-CM

## 2022-01-01 HISTORY — DX: Hyperparathyroidism, unspecified: E21.3

## 2022-01-01 HISTORY — DX: Diverticulosis of intestine, part unspecified, without perforation or abscess without bleeding: K57.90

## 2022-01-01 HISTORY — PX: CYSTOSCOPY/URETEROSCOPY/HOLMIUM LASER/STENT PLACEMENT: SHX6546

## 2022-01-01 SURGERY — CYSTOSCOPY/URETEROSCOPY/HOLMIUM LASER/STENT PLACEMENT
Anesthesia: General | Site: Ureter | Laterality: Left

## 2022-01-01 MED ORDER — SUGAMMADEX SODIUM 200 MG/2ML IV SOLN
INTRAVENOUS | Status: DC | PRN
  Administered 2022-01-01: 300 mg via INTRAVENOUS

## 2022-01-01 MED ORDER — SODIUM CHLORIDE 0.9 % IR SOLN
Status: DC | PRN
  Administered 2022-01-01: 3000 mL

## 2022-01-01 MED ORDER — FAMOTIDINE 20 MG PO TABS
ORAL_TABLET | ORAL | Status: AC
  Filled 2022-01-01: qty 1

## 2022-01-01 MED ORDER — ACETAMINOPHEN 10 MG/ML IV SOLN
INTRAVENOUS | Status: DC | PRN
  Administered 2022-01-01: 1000 mg via INTRAVENOUS

## 2022-01-01 MED ORDER — FENTANYL CITRATE (PF) 100 MCG/2ML IJ SOLN
INTRAMUSCULAR | Status: AC
  Filled 2022-01-01: qty 2

## 2022-01-01 MED ORDER — OXYCODONE HCL 5 MG PO TABS: 5.0000 mg | ORAL_TABLET | Freq: Once | ORAL | Status: DC | PRN

## 2022-01-01 MED ORDER — MIDAZOLAM HCL 2 MG/2ML IJ SOLN
INTRAMUSCULAR | Status: AC
  Filled 2022-01-01: qty 2

## 2022-01-01 MED ORDER — DEXAMETHASONE SODIUM PHOSPHATE 10 MG/ML IJ SOLN
INTRAMUSCULAR | Status: DC | PRN
  Administered 2022-01-01: 10 mg via INTRAVENOUS

## 2022-01-01 MED ORDER — PHENYLEPHRINE HCL (PRESSORS) 10 MG/ML IV SOLN
INTRAVENOUS | Status: DC | PRN
  Administered 2022-01-01: 160 ug via INTRAVENOUS

## 2022-01-01 MED ORDER — MIDAZOLAM HCL 2 MG/2ML IJ SOLN
INTRAMUSCULAR | Status: DC | PRN
  Administered 2022-01-01: 2 mg via INTRAVENOUS

## 2022-01-01 MED ORDER — ROCURONIUM BROMIDE 100 MG/10ML IV SOLN
INTRAVENOUS | Status: DC | PRN
  Administered 2022-01-01: 30 mg via INTRAVENOUS

## 2022-01-01 MED ORDER — CEFAZOLIN SODIUM-DEXTROSE 2-4 GM/100ML-% IV SOLN
INTRAVENOUS | Status: AC
  Filled 2022-01-01: qty 100

## 2022-01-01 MED ORDER — PROPOFOL 10 MG/ML IV BOLUS
INTRAVENOUS | Status: DC | PRN
  Administered 2022-01-01: 150 mg via INTRAVENOUS

## 2022-01-01 MED ORDER — OXYBUTYNIN CHLORIDE 5 MG PO TABS: ORAL_TABLET | ORAL | 0 refills | Status: DC

## 2022-01-01 MED ORDER — IOHEXOL 180 MG/ML  SOLN
INTRAMUSCULAR | Status: DC | PRN
  Administered 2022-01-01: 20 mL

## 2022-01-01 MED ORDER — ORAL CARE MOUTH RINSE: 15.0000 mL | Freq: Once | OROMUCOSAL | Status: AC

## 2022-01-01 MED ORDER — FAMOTIDINE 20 MG PO TABS
20.0000 mg | ORAL_TABLET | Freq: Once | ORAL | Status: AC
  Administered 2022-01-01: 20 mg via ORAL

## 2022-01-01 MED ORDER — GLYCOPYRROLATE 0.2 MG/ML IJ SOLN
INTRAMUSCULAR | Status: DC | PRN
  Administered 2022-01-01: .2 mg via INTRAVENOUS

## 2022-01-01 MED ORDER — HYDROCODONE-ACETAMINOPHEN 5-325 MG PO TABS: 1.0000 | ORAL_TABLET | ORAL | 0 refills | Status: DC | PRN

## 2022-01-01 MED ORDER — CHLORHEXIDINE GLUCONATE 0.12 % MT SOLN
15.0000 mL | Freq: Once | OROMUCOSAL | Status: AC
  Administered 2022-01-01: 15 mL via OROMUCOSAL

## 2022-01-01 MED ORDER — LACTATED RINGERS IV SOLN: INTRAVENOUS | Status: DC

## 2022-01-01 MED ORDER — ONDANSETRON HCL 4 MG/2ML IJ SOLN
INTRAMUSCULAR | Status: DC | PRN
  Administered 2022-01-01 (×2): 4 mg via INTRAVENOUS

## 2022-01-01 MED ORDER — HYDROMORPHONE HCL 1 MG/ML IJ SOLN: 0.2500 mg | INTRAMUSCULAR | Status: DC | PRN

## 2022-01-01 MED ORDER — LIDOCAINE HCL (CARDIAC) PF 100 MG/5ML IV SOSY
PREFILLED_SYRINGE | INTRAVENOUS | Status: DC | PRN
  Administered 2022-01-01: 80 mg via INTRAVENOUS

## 2022-01-01 MED ORDER — OXYCODONE HCL 5 MG/5ML PO SOLN: 5.0000 mg | Freq: Once | ORAL | Status: DC | PRN

## 2022-01-01 MED ORDER — FENTANYL CITRATE (PF) 100 MCG/2ML IJ SOLN
INTRAMUSCULAR | Status: DC | PRN
  Administered 2022-01-01: 50 ug via INTRAVENOUS

## 2022-01-01 MED ORDER — ACETAMINOPHEN 10 MG/ML IV SOLN
INTRAVENOUS | Status: AC
  Filled 2022-01-01: qty 100

## 2022-01-01 MED ORDER — CEFAZOLIN SODIUM-DEXTROSE 2-4 GM/100ML-% IV SOLN
2.0000 g | INTRAVENOUS | Status: AC
  Administered 2022-01-01: 2 g via INTRAVENOUS

## 2022-01-01 MED ORDER — CHLORHEXIDINE GLUCONATE 0.12 % MT SOLN
OROMUCOSAL | Status: AC
  Filled 2022-01-01: qty 15

## 2022-01-01 SURGICAL SUPPLY — 27 items
BAG DRAIN SIEMENS DORNER NS (MISCELLANEOUS) ×1 IMPLANT
BASKET ZERO TIP 1.9FR (BASKET) IMPLANT
BRUSH SCRUB EZ 1% IODOPHOR (MISCELLANEOUS) ×1 IMPLANT
CATH URET FLEX-TIP 2 LUMEN 10F (CATHETERS) IMPLANT
CATH URETL OPEN END 6X70 (CATHETERS) IMPLANT
CNTNR SPEC 2.5X3XGRAD LEK (MISCELLANEOUS)
CONT SPEC 4OZ STER OR WHT (MISCELLANEOUS)
CONTAINER SPEC 2.5X3XGRAD LEK (MISCELLANEOUS) IMPLANT
DRAPE UTILITY 15X26 TOWEL STRL (DRAPES) ×1 IMPLANT
FIBER LASER MOSES 200 DFL (Laser) IMPLANT
GLOVE SURG UNDER POLY LF SZ7.5 (GLOVE) ×1 IMPLANT
GOWN STRL REUS W/ TWL LRG LVL3 (GOWN DISPOSABLE) ×1 IMPLANT
GOWN STRL REUS W/ TWL XL LVL3 (GOWN DISPOSABLE) ×1 IMPLANT
GOWN STRL REUS W/TWL LRG LVL3 (GOWN DISPOSABLE) ×1
GOWN STRL REUS W/TWL XL LVL3 (GOWN DISPOSABLE) ×1
GUIDEWIRE STR DUAL SENSOR (WIRE) ×1 IMPLANT
IV NS IRRIG 3000ML ARTHROMATIC (IV SOLUTION) ×1 IMPLANT
KIT TURNOVER CYSTO (KITS) ×1 IMPLANT
PACK CYSTO AR (MISCELLANEOUS) ×1 IMPLANT
SET CYSTO W/LG BORE CLAMP LF (SET/KITS/TRAYS/PACK) ×1 IMPLANT
SHEATH NAVIGATOR HD 12/14X36 (SHEATH) IMPLANT
STENT URET 6FRX24 CONTOUR (STENTS) IMPLANT
STENT URET 6FRX26 CONTOUR (STENTS) IMPLANT
SURGILUBE 2OZ TUBE FLIPTOP (MISCELLANEOUS) ×1 IMPLANT
TRAP FLUID SMOKE EVACUATOR (MISCELLANEOUS) ×1 IMPLANT
VALVE UROSEAL ADJ ENDO (VALVE) IMPLANT
WATER STERILE IRR 500ML POUR (IV SOLUTION) ×1 IMPLANT

## 2022-01-01 NOTE — Anesthesia Preprocedure Evaluation (Addendum)
Anesthesia Evaluation  Patient identified by MRN, date of birth, ID band Patient awake    Reviewed: Allergy & Precautions, NPO status , Patient's Chart, lab work & pertinent test results  History of Anesthesia Complications (+) PONV and history of anesthetic complications  Airway Mallampati: II  TM Distance: >3 FB Neck ROM: full    Dental  (+) Teeth Intact   Pulmonary asthma ,    Pulmonary exam normal        Cardiovascular Exercise Tolerance: Good (-) angina+CHF  (-) DOE Normal cardiovascular exam+ Cardiac Defibrillator   INTERPRETATION  MILD LV SYSTOLIC DYSFUNCTION (See above)  WITH MILD LVH  NORMAL RIGHT VENTRICULAR SYSTOLIC FUNCTION  MILD VALVULAR REGURGITATION (See above)  NO VALVULAR STENOSIS  ESTIMATED LVEF45-50%  Aortic: TRIVIAL AI  Mitral: TRIVIAL MR  Tricuspid: MILD TR (2.58ms) *PACEMAKER WIRE NOTED  Pulmonic: MILD PI  MILDLY DILATED ASCENDING AORTA MEASURING UP TO 3.8cm  MILDLY DILATED AORTIC ROOT MEASURING 3.5cm  Closest EF: 45% (Estimated)  Contraction: MILD GLOBAL DECREASE  Mitral: TRIVIAL MR  Aortic: TRIVIAL AR   Cardiology Visit 10/10   Interrogation of her device realizes it has 2.5 years of battery life remaining. Device is functioning normally. She is approximately 98% V paced. She recently underwent an echocardiogram from 2023 which showed improvement in her LV function to 40 to 45%. This was unchanged from her previous echo 1 year earlier. She is able to be more active without difficulty.   Neuro/Psych PSYCHIATRIC DISORDERS Anxiety Depression negative neurological ROS     GI/Hepatic negative GI ROS, Neg liver ROS,   Endo/Other  negative endocrine ROSHyperparathyroid   Renal/GU negative Renal ROS  negative genitourinary   Musculoskeletal  (+) Arthritis ,   Abdominal   Peds  Hematology negative hematology ROS (+)   Anesthesia Other Findings Past Medical History: 05/22/2021: Acquired  dilation of ascending aorta and aortic root (HCC)     Comment:  a.) TTE 05/22/2021: Ao root 3.8 cm; asc Ao 3.5 cm No date: Anginal pain (HCC) No date: Anxiety     Comment:  a.) on BZO (alprazolam) PRN No date: Arthritis No date: Asthma No date: CHF (congestive heart failure) (HCC) No date: Depression No date: Dilated cardiomyopathy ([place]     Comment:  a.) R/LHC 09/02/2016: EF <25%; b.) TTE 08/13/2016: EF               <15%; c.) MPI 08/14/2016: EF 21%; d.) cMRI 09/12/2016: EF              22%, no LGE; e.) s/p CRT-D placement 11/27/2016; f.) TTE               01/09/2017: EF 25%; g.) TTE 09/11/2017: EF 25-30%; h.)               TTE 10/09/2018: EF 35%; i.) TTE 09/10/2019: EF 45%; j.)               TTE 08/04/2020: EF 45%; k.) TTE 05/22/2021: EF 45% No date: Diverticulosis No date: History of kidney stones No date: Hyperlipidemia No date: Hyperparathyroidism ([place] 2012: LBBB (left bundle branch block) No date: PONV (postoperative nausea and vomiting)     Comment:  a.) single remote episode No date: Pre-diabetes 11/27/2016: Presence of cardiac resynchronization therapy  defibrillator (CRT-D) 08/13/2016: Pulmonary HTN ([place]     Comment:  a.) TTE 08/13/2017: RVSP 41.2; b.) TTE 01/09/2017: RVSP               36.9; c.)  TTE 09/11/2017: RVSP 30.4 No date: Vitamin D deficiency  Past Surgical History: 11/27/2016: BI-VENTRICULAR IMPLANTABLE CARDIOVERTER DEFIBRILLATOR   (CRT-D); N/A     Comment:  Procedure: BI-VENTRICULAR IMPLANTABLE CARDIOVERTER               DEFIBRILLATOR  (CRT-D); Location: [facility]; Surgeon: Mylinda Latina, MD 06/16/2020: COLONOSCOPY WITH PROPOFOL; N/A     Comment:  Procedure: COLONOSCOPY WITH PROPOFOL;  Surgeon: Jonathon Bellows, MD;  Location: [facility] ENDOSCOPY;  Service:               Gastroenterology;  Laterality: N/A; 11/29/2021: EXTRACORPOREAL SHOCK WAVE LITHOTRIPSY; Left     Comment:  procedure not done 08/27/2016: RIGHT/LEFT HEART CATH AND CORONARY  ANGIOGRAPHY; Bilateral     Comment:  Procedure: Right/Left Heart Cath and Coronary               Angiography;  Surgeon: Teodoro Spray, MD;  Location:               [place] [facility];  Service: Cardiovascular;                Laterality: Bilateral; No date: WISDOM TOOTH EXTRACTION  BMI    Body Mass Index: 34.33 kg/m      Reproductive/Obstetrics negative OB ROS                            Anesthesia Physical Anesthesia Plan  ASA: 3  Anesthesia Plan: General ETT   Post-op Pain Management: Minimal or no pain anticipated   Induction: Intravenous  PONV Risk Score and Plan: 4 or greater and Ondansetron, Dexamethasone, Midazolam and Treatment may vary due to age or medical condition  Airway Management Planned: Oral ETT  Additional Equipment:   Intra-op Plan:   Post-operative Plan: Extubation in OR  Informed Consent: I have reviewed the patients History and Physical, chart, labs and discussed the procedure including the risks, benefits and alternatives for the proposed anesthesia with the patient or authorized representative who has indicated his/her understanding and acceptance.     Dental Advisory Given  Plan Discussed with: Anesthesiologist, CRNA and Surgeon  Anesthesia Plan Comments: (Patient consented for risks of anesthesia including but not limited to:  - adverse reactions to medications - damage to eyes, teeth, lips or other oral mucosa - nerve damage due to positioning  - sore throat or hoarseness - Damage to heart, brain, nerves, lungs, other parts of body or loss of life  Patient voiced understanding.)        Anesthesia Quick Evaluation

## 2022-01-01 NOTE — Anesthesia Procedure Notes (Signed)
Procedure Name: Intubation Date/Time: 01/01/2022 12:30 PM  Performed by: Jonna Clark, CRNAPre-anesthesia Checklist: Patient identified, Patient being monitored, Timeout performed, Emergency Drugs available and Suction available Patient Re-evaluated:Patient Re-evaluated prior to induction Oxygen Delivery Method: Circle system utilized Preoxygenation: Pre-oxygenation with 100% oxygen Induction Type: IV induction Ventilation: Mask ventilation without difficulty Laryngoscope Size: Mac and 3 Grade View: Grade I Tube type: Oral Tube size: 6.5 mm Number of attempts: 1 Airway Equipment and Method: Stylet Placement Confirmation: ETT inserted through vocal cords under direct vision, positive ETCO2 and breath sounds checked- equal and bilateral Secured at: 21 cm Tube secured with: Tape Dental Injury: Teeth and Oropharynx as per pre-operative assessment

## 2022-01-01 NOTE — Interval H&P Note (Signed)
History and Physical Interval Note:  01/01/2022 12:12 PM  Erin Stuart  has presented today for surgery, with the diagnosis of Left Ureteral Stone.  The various methods of treatment have been discussed with the patient and family. After consideration of risks, benefits and other options for treatment, the patient has consented to  Procedure(s): CYSTOSCOPY/URETEROSCOPY/HOLMIUM LASER/STENT PLACEMENT (Left) as a surgical intervention.  The patient's history has been reviewed, patient examined, no change in status, stable for surgery.  I have reviewed the patient's chart and labs.  Questions were answered to the patient's satisfaction.     [place]

## 2022-01-01 NOTE — H&P (Signed)
Urology H&P  History of Present Illness: Erin Stuart is a 56 y.o. female initially seen September 2023 with a 5 mm left proximal ureteral calculus.  She was scheduled for SWL however on the day of the procedure her stone was not visualized in her pain had resolved.  Follow-up UA did show persistent microhematuria.  Follow-up CT showed migration of the calculus to the left UVJ.  She has had urinary frequency, urgency and bladder pressure but no significant colic.  The calculus was not visualized on KUB and she has elected ureteroscopic removal.  Preoperative urine culture did not grow E. coli and she has been treated with antibiotics.  Past Medical History:  Diagnosis Date   Acquired dilation of ascending aorta and aortic root ([place]) 05/22/2021   a.) TTE 05/22/2021: Ao root 3.8 cm; asc Ao 3.5 cm   Anginal pain (HCC)    Anxiety    a.) on BZO (alprazolam) PRN   Arthritis    Asthma    CHF (congestive heart failure) ([place])    Depression    Dilated cardiomyopathy ([place])    a.) R/LHC 09/02/2016: EF <25%; b.) TTE 08/13/2016: EF <15%; c.) MPI 08/14/2016: EF 21%; d.) cMRI 09/12/2016: EF 22%, no LGE; e.) s/p CRT-D placement 11/27/2016; f.) TTE 01/09/2017: EF 25%; g.) TTE 09/11/2017: EF 25-30%; h.) TTE 10/09/2018: EF 35%; i.) TTE 09/10/2019: EF 45%; j.) TTE 08/04/2020: EF 45%; k.) TTE 05/22/2021: EF 45%   Diverticulosis    History of kidney stones    Hyperlipidemia    Hyperparathyroidism ([place])    LBBB (left bundle branch block) 2012   PONV (postoperative nausea and vomiting)    a.) single remote episode   Pre-diabetes    Presence of cardiac resynchronization therapy defibrillator (CRT-D) 11/27/2016   Pulmonary HTN ([place]) 08/13/2016   a.) TTE 08/13/2017: RVSP 41.2; b.) TTE 01/09/2017: RVSP 36.9; c.) TTE 09/11/2017: RVSP 30.4   Vitamin D deficiency     Past Surgical History:  Procedure Laterality Date   BI-VENTRICULAR IMPLANTABLE CARDIOVERTER DEFIBRILLATOR  (CRT-D) N/A 11/27/2016    Procedure: BI-VENTRICULAR IMPLANTABLE CARDIOVERTER DEFIBRILLATOR  (CRT-D); Location: [facility]; Surgeon: Mylinda Latina, MD   COLONOSCOPY WITH PROPOFOL N/A 06/16/2020   Procedure: COLONOSCOPY WITH PROPOFOL;  Surgeon: Jonathon Bellows, MD;  Location: [facility] ENDOSCOPY;  Service: Gastroenterology;  Laterality: N/A;   EXTRACORPOREAL SHOCK WAVE LITHOTRIPSY Left 11/29/2021   procedure not done   RIGHT/LEFT HEART CATH AND CORONARY ANGIOGRAPHY Bilateral 08/27/2016   Procedure: Right/Left Heart Cath and Coronary Angiography;  Surgeon: Teodoro Spray, MD;  Location: [facility];  Service: Cardiovascular;  Laterality: Bilateral;   WISDOM TOOTH EXTRACTION      Home Medications:  Current Meds  Medication Sig   ALPRAZolam (XANAX) 1 MG tablet Take 1 tablet (1 mg total) by mouth 3 (three) times daily as needed for anxiety.   Ascorbic Acid (VITAMIN C) 500 MG CHEW Chew 1 tablet by mouth daily.   aspirin 81 MG tablet Take 81 mg by mouth every evening. Reported on 03/02/2015   buPROPion (WELLBUTRIN XL) 150 MG 24 hr tablet Take 150 mg by mouth daily.   carvedilol (COREG) 6.25 MG tablet Take 6.25 mg by mouth 2 (two) times daily.   cefUROXime (CEFTIN) 250 MG tablet Take 1 tablet (250 mg total) by mouth 2 (two) times daily with a meal for 7 days.   Cholecalciferol (VITAMIN D3) 125 MCG (5000 UT) CAPS Take by mouth 2 (two) times a week.   docusate sodium (COLACE) 100  MG capsule Take 100 mg by mouth daily as needed for mild constipation.   furosemide (LASIX) 20 MG tablet Take 20 mg by mouth daily.   ibuprofen (ADVIL,MOTRIN) 200 MG tablet Take 800 mg by mouth daily as needed for moderate pain.   losartan (COZAAR) 25 MG tablet Take 25 mg by mouth daily.   Multiple Vitamins-Minerals (ALIVE WOMENS 50+) TABS Take 1 tablet by mouth daily.   rosuvastatin (CRESTOR) 5 MG tablet Take 5 mg by mouth at bedtime.   spironolactone (ALDACTONE) 25 MG tablet Take 25 mg by mouth daily.   tamsulosin (FLOMAX) 0.4 MG CAPS capsule Take 1  capsule (0.4 mg total) by mouth daily after breakfast. (Patient taking differently: Take 0.4 mg by mouth daily after supper.)    Allergies:  Allergies  Allergen Reactions   Sulfa Antibiotics Other (See Comments)    Pt states it felt like she had the flu.   Bactrim [Sulfamethoxazole-Trimethoprim] Nausea And Vomiting    Family History  Problem Relation Age of Onset   Cancer Paternal Aunt        ovairan   Breast cancer Neg Hx     Social History:  reports that she has never smoked. She has never used smokeless tobacco. She reports that she does not drink alcohol and does not use drugs.  ROS: A complete review of systems was performed.  All systems are negative except for pertinent findings as noted.  Physical Exam:  Vital signs in last 24 hours: Temp:  [97 F (36.1 C)] 97 F (36.1 C) (10/17 1010) Pulse Rate:  [72] 72 (10/17 1010) Resp:  [18] 18 (10/17 1010) BP: (121)/(81) 121/81 (10/17 1010) SpO2:  [97 %] 97 % (10/17 1010) Weight:  [90.7 kg] 90.7 kg (10/17 1010) Constitutional:  Alert and oriented, No acute distress HEENT: [place] AT, moist mucus membranes.  Trachea midline, no masses Cardiovascular: Regular rate and rhythm Respiratory: Normal respiratory effort, lungs clear bilaterally Psychiatric: Normal mood and affect   Laboratory Data:  No results for input(s): "WBC", "HGB", "HCT" in the last 72 hours. No results for input(s): "NA", "K", "CL", "CO2", "GLUCOSE", "BUN", "CREATININE", "CALCIUM" in the last 72 hours. No results for input(s): "LABPT", "INR" in the last 72 hours. No results for input(s): "LABURIN" in the last 72 hours. Results for orders placed or performed in visit on 12/24/21  Microscopic Examination     Status: Abnormal   Collection Time: 12/24/21  3:01 PM   Urine  Result Value Ref Range Status   WBC, UA 11-30 (A) 0 - 5 /hpf Final   RBC, Urine 3-10 (A) 0 - 2 /hpf Final   Epithelial Cells (non renal) 0-10 0 - 10 /hpf Final   Bacteria, UA Many (A) None  seen/Few Final  CULTURE, URINE COMPREHENSIVE     Status: Abnormal   Collection Time: 12/24/21  3:34 PM   Specimen: Urine   Urine  Result Value Ref Range Status   Urine Culture, Comprehensive Final report (A)  Final   Organism ID, Bacteria Escherichia coli (A)  Final    Comment: Cefazolin <=4 ug/mL Cefazolin with an MIC <=16 predicts susceptibility to the oral agents cefaclor, cefdinir, cefpodoxime, cefprozil, cefuroxime, cephalexin, and loracarbef when used for therapy of uncomplicated urinary tract infections due to E. coli, Klebsiella pneumoniae, and Proteus mirabilis. 50,000-100,000 colony forming units per mL    ANTIMICROBIAL SUSCEPTIBILITY Comment  Final    Comment:       ** S = Susceptible; I = Intermediate; R =  Resistant **                    P = Positive; N = Negative             MICS are expressed in micrograms per mL    Antibiotic                 RSLT#1    RSLT#2    RSLT#3    RSLT#4 Amoxicillin/Clavulanic Acid    S Ampicillin                     S Cefepime                       S Ceftriaxone                    S Cefuroxime                     S Ciprofloxacin                  S Ertapenem                      S Gentamicin                     S Imipenem                       S Levofloxacin                   S Meropenem                      S Nitrofurantoin                 S Piperacillin/Tazobactam        S Tetracycline                   S Tobramycin                     S Trimethoprim/Sulfa             S      Impression/Assessment:  Left distal ureteral calculus  Plan:  Left ureteroscopic stone removal.  The procedure has been discussed including potential risks of bleeding, infection and ureteral injury.  The potential need for a stent postoperatively was discussed in addition to stent related symptoms.  All questions were answered and she desires to proceed.   01/01/2022, 12:08 PM  John Giovanni,  MD

## 2022-01-01 NOTE — Transfer of Care (Signed)
Immediate Anesthesia Transfer of Care Note  Patient: Lateka Rady Busk  Procedure(s) Performed: CYSTOSCOPY/URETEROSCOPY/HOLMIUM LASER/STENT PLACEMENT (Left: Ureter)  Patient Location: PACU  Anesthesia Type:General  Level of Consciousness: awake, drowsy and patient cooperative  Airway & Oxygen Therapy: Patient Spontanous Breathing and Patient connected to face mask oxygen  Post-op Assessment: Report given to RN and Post -op Vital signs reviewed and stable  Post vital signs: Reviewed and unstable  Last Vitals:  Vitals Value Taken Time  BP 121/59 01/01/22 1318  Temp    Pulse 62 01/01/22 1322  Resp 12 01/01/22 1322  SpO2 97 % 01/01/22 1322  Vitals shown include unvalidated device data.  Last Pain:  Vitals:   01/01/22 1010  TempSrc: Oral  PainSc: 0-No pain         Complications: No notable events documented.

## 2022-01-01 NOTE — Anesthesia Postprocedure Evaluation (Signed)
Anesthesia Post Note  Patient: Erin Stuart  Procedure(s) Performed: CYSTOSCOPY/URETEROSCOPY/HOLMIUM LASER/STENT PLACEMENT (Left: Ureter)  Patient location during evaluation: PACU Anesthesia Type: General Level of consciousness: awake and alert Pain management: pain level controlled Vital Signs Assessment: post-procedure vital signs reviewed and stable Respiratory status: spontaneous breathing, nonlabored ventilation, respiratory function stable and patient connected to nasal cannula oxygen Cardiovascular status: blood pressure returned to baseline and stable Postop Assessment: no apparent nausea or vomiting Anesthetic complications: no   No notable events documented.   Last Vitals:  Vitals:   01/01/22 1330 01/01/22 1345  BP: 119/68 107/73  Pulse: 60 60  Resp: 12 13  Temp:    SpO2: 98% 99%    Last Pain:  Vitals:   01/01/22 1345  TempSrc:   PainSc: 0-No pain                 Ilene Qua

## 2022-01-01 NOTE — Discharge Instructions (Addendum)
AMBULATORY SURGERY  DISCHARGE INSTRUCTIONS   The drugs that you were given will stay in your system until tomorrow so for the next 24 hours you should not:  Drive an automobile Make any legal decisions Drink any alcoholic beverage   You may resume regular meals tomorrow.  Today it is better to start with liquids and gradually work up to solid foods.  You may eat anything you prefer, but it is better to start with liquids, then soup and crackers, and gradually work up to solid foods.   Please notify your doctor immediately if you have any unusual bleeding, trouble breathing, redness and pain at the surgery site, drainage, fever, or pain not relieved by medication.    Additional Instructions:  DISCHARGE INSTRUCTIONS FOR KIDNEY STONE/URETERAL STENT   MEDICATIONS:  1. Resume all your other meds from home.  2.  AZO (over-the-counter) can help with the burning/stinging when you urinate. 3.  Hydrocodone is for moderate/severe pain, Rx was sent to your pharmacy if needed. 4.  Oxybutynin is for bladder/stent irritation.  Rx was sent to your pharmacy. 5.  Tamsulosin may also help stent irritation and you may continue  ACTIVITY:  1. May resume regular activities in 24 hours. 2. No driving while on narcotic pain medications  3. Drink plenty of water  4. Continue to walk at home - you can still get blood clots when you are at home, so keep active, but don't over do it.  5. May return to work/school tomorrow or when you feel ready   BATHING:  1. You can shower. 2. You have a string coming from your urethra: The stent string is attached to your ureteral stent. Do not pull on this.   SIGNS/SYMPTOMS TO CALL:  Common postoperative symptoms include urinary frequency, urgency, bladder spasm and blood in the urine  Please call us if you have a fever greater than 101.5, uncontrolled nausea/vomiting, uncontrolled pain, dizziness, unable to urinate, excessively bloody urine, chest pain,  shortness of breath, leg swelling, leg pain, or any other concerns or questions.   You can reach [place] at [phone].   FOLLOW-UP:  1. You we will be contacted by the office for a follow-up appointment in approximately 1 month 2. You have a string attached to your stent, you may remove it on Friday, 01/04/2022. To do this, pull the string until the stent is completely removed. You may feel an odd sensation in your back.       Please contact your physician with any problems or Same Day Surgery at [phone], Monday through Friday 6 am to 4 pm, or [place] at [facility] number at [phone].

## 2022-01-01 NOTE — Op Note (Signed)
Preoperative diagnosis: Left distal ureteral calculus  Postoperative diagnosis: Left distal ureteral calculus  Procedure:  Cystoscopy Left ureteroscopy and stone removal Ureteroscopic laser lithotripsy Left ureteral stent placement (18F/24 cm)  Left retrograde pyelography with interpretation  Surgeon: Nicki Reaper C. Dontel Harshberger, M.D.  Anesthesia: General  Complications: None  Intraoperative findings:  Cystoscopy-bladder mucosa without erythema, solid or papillary lesions.  Edematous left UO Ureteroscopy-nonimpacted calculus at UVJ.  Mild ureteral edema Left retrograde pyelography post procedure showed no filling defects, stone fragments or contrast extravasation.  EBL: Minimal  Specimens: None   Indication: Erin Stuart is a 56 y.o.  female initially seen September 2023 with a 5 mm left proximal ureteral calculus.  She was scheduled for SWL however on the day of the procedure her stone was not visualized in her pain had resolved.  Follow-up UA did show persistent microhematuria and follow-up CT showed migration of the calculus to the left UVJ.  She has had urinary frequency, urgency and bladder pressure but no significant colic.  The calculus was not visualized on KUB and she has elected ureteroscopic removal.  After reviewing the management options for treatment, the patient elected to proceed with the above surgical procedure(s). We have discussed the potential benefits and risks of the procedure, side effects of the proposed treatment, the likelihood of the patient achieving the goals of the procedure, and any potential problems that might occur during the procedure or recuperation. Informed consent has been obtained.  Description of procedure:  The patient was taken to the operating room and general anesthesia was induced.  The patient was placed in the dorsal lithotomy position, prepped and draped in the usual sterile fashion, and preoperative antibiotics were administered. A  preoperative time-out was performed.   A 21 French cystoscope was lubricated and passed per urethra.  Panendoscopy was performed with findings as described above.  Attention was directed to the left ureteral orifice and a 0.038 Sensor wire was then advanced up the ureter into the renal pelvis under fluoroscopic guidance.  A 4.5 Fr semirigid ureteroscope was then advanced into the ureter next to the guidewire with the calculus visualized just within the ureter.  It was pushed back slightly with the tip in the ureteroscope.  The stone was then fragmented with a 200 micron holmium laser fiber on a setting of 0.3J/40 Hz  The majority of fragments passed out of the ureter with treatment.  A few small fragments clinging to the mucosa were swept free with an open 1.9 [place] nitinol basket.  The ureteroscope was passed to the UPJ and no additional calculus or fragments were identified.  Retrograde pyelogram was performed with findings as described above.  A 18F/24 cm Contour ureteral stent with tether was placed under fluoroscopic guidance.  The wire was then removed with an adequate stent curl noted in the renal pelvis as well as in the bladder.  The stent string was cut short and tucked in the vagina.  The bladder was then emptied and the procedure ended.  The patient appeared to tolerate the procedure well and without complications.  After anesthetic reversal the patient was transported to the PACU in stable condition.   Plan: Instructed to remove the stent on Friday 01/04/2022 Postop follow-up 1 month   John Giovanni, MD

## 2022-01-02 ENCOUNTER — Telehealth: Payer: Self-pay | Admitting: Urology

## 2022-01-02 NOTE — Telephone Encounter (Signed)
Patient called the office today to report some urinary incontinence post surgery/stent placement yesterday with Dr. Bernardo Heater.  I reassured patient that her urinary incontinence is most likely due to bladder spasms from having a stent placed.  I suggested to her to continue to take the oxybutynin for bladder spasms and her symptoms should improve.  Patient was instructed at discharge that she could remove her stent on Friday.  I told patient that she is welcome to call back if her symptoms worsen or fail to improve.

## 2022-01-08 ENCOUNTER — Telehealth: Payer: Self-pay

## 2022-01-08 DIAGNOSIS — I420 Dilated cardiomyopathy: Secondary | ICD-10-CM | POA: Diagnosis not present

## 2022-01-08 NOTE — Telephone Encounter (Signed)
Pt is requesting EGFR prior to appt on 11/17.  Concerned about her kidney function.   Pls advise. Thanks.

## 2022-01-08 NOTE — Telephone Encounter (Signed)
Advised patient that we will see her in the office for her appt. She understands.

## 2022-01-16 ENCOUNTER — Other Ambulatory Visit: Payer: Self-pay | Admitting: Urology

## 2022-01-29 ENCOUNTER — Other Ambulatory Visit: Payer: Self-pay | Admitting: Urology

## 2022-01-31 DIAGNOSIS — N200 Calculus of kidney: Secondary | ICD-10-CM | POA: Insufficient documentation

## 2022-02-01 ENCOUNTER — Encounter: Payer: Self-pay | Admitting: Urology

## 2022-02-01 ENCOUNTER — Ambulatory Visit: Payer: 59 | Admitting: Urology

## 2022-02-14 ENCOUNTER — Ambulatory Visit (INDEPENDENT_AMBULATORY_CARE_PROVIDER_SITE_OTHER): Payer: 59 | Admitting: Urology

## 2022-02-14 ENCOUNTER — Encounter: Payer: Self-pay | Admitting: Urology

## 2022-02-14 VITALS — BP 136/90 | HR 102 | Ht 64.0 in | Wt 202.0 lb

## 2022-02-14 DIAGNOSIS — N200 Calculus of kidney: Secondary | ICD-10-CM

## 2022-02-14 DIAGNOSIS — Z87442 Personal history of urinary calculi: Secondary | ICD-10-CM | POA: Diagnosis not present

## 2022-02-14 NOTE — Progress Notes (Signed)
02/14/2022 12:03 PM   Erin Stuart [birth year] [id number]  Referring provider: Marinda Elk, MD [place] [facility] [place],  [place] [address]  Chief Complaint  Patient presents with   Follow-up    HPI: 56 y.o. female presents for postop follow-up.  s/p ureteroscopic removal of a 5 mm left UVJ calculus 01/01/2022 She removed her stent via tether 3 days postop No problems post stent removal and she has no complaints today Has been seen by endocrinology for hyperparathyroidism.  Is scheduled for knee replacement surgery next month and then has a follow-up endocrinology appointment after her surgery   PMH: Past Medical History:  Diagnosis Date   Acquired dilation of ascending aorta and aortic root ([place]) 05/22/2021   a.) TTE 05/22/2021: Ao root 3.8 cm; asc Ao 3.5 cm   Anginal pain (HCC)    Anxiety    a.) on BZO (alprazolam) PRN   Arthritis    Asthma    CHF (congestive heart failure) ([place])    Depression    Dilated cardiomyopathy ([place])    a.) R/LHC 09/02/2016: EF <25%; b.) TTE 08/13/2016: EF <15%; c.) MPI 08/14/2016: EF 21%; d.) cMRI 09/12/2016: EF 22%, no LGE; e.) s/p CRT-D placement 11/27/2016; f.) TTE 01/09/2017: EF 25%; g.) TTE 09/11/2017: EF 25-30%; h.) TTE 10/09/2018: EF 35%; i.) TTE 09/10/2019: EF 45%; j.) TTE 08/04/2020: EF 45%; k.) TTE 05/22/2021: EF 45%   Diverticulosis    History of kidney stones    Hyperlipidemia    Hyperparathyroidism ([place])    LBBB (left bundle branch block) 2012   PONV (postoperative nausea and vomiting)    a.) single remote episode   Pre-diabetes    Presence of cardiac resynchronization therapy defibrillator (CRT-D) 11/27/2016   Pulmonary HTN ([place]) 08/13/2016   a.) TTE 08/13/2017: RVSP 41.2; b.) TTE 01/09/2017: RVSP 36.9; c.) TTE 09/11/2017: RVSP 30.4   Vitamin D deficiency     Surgical History: Past Surgical History:  Procedure Laterality Date   BI-VENTRICULAR IMPLANTABLE CARDIOVERTER DEFIBRILLATOR   (CRT-D) N/A 11/27/2016   Procedure: BI-VENTRICULAR IMPLANTABLE CARDIOVERTER DEFIBRILLATOR  (CRT-D); Location: [facility]; Surgeon: Mylinda Latina, MD   COLONOSCOPY WITH PROPOFOL N/A 06/16/2020   Procedure: COLONOSCOPY WITH PROPOFOL;  Surgeon: Jonathon Bellows, MD;  [facility] ENDOSCOPY;  Service: Gastroenterology;  Laterality: N/A;   CYSTOSCOPY/URETEROSCOPY/HOLMIUM LASER/STENT PLACEMENT Left 01/01/2022   Procedure: CYSTOSCOPY/URETEROSCOPY/HOLMIUM LASER/STENT PLACEMENT;  Surgeon: Abbie Sons, MD;  Location: [facility] ORS;  Service: Urology;  Laterality: Left;   EXTRACORPOREAL SHOCK WAVE LITHOTRIPSY Left 11/29/2021   procedure not done   RIGHT/LEFT HEART CATH AND CORONARY ANGIOGRAPHY Bilateral 08/27/2016   Procedure: Right/Left Heart Cath and Coronary Angiography;  Surgeon: Teodoro Spray, MD;  Location: [facility] CV LAB;  Service: Cardiovascular;  Laterality: Bilateral;   WISDOM TOOTH EXTRACTION      Home Medications:  Allergies as of 02/14/2022       Reactions   Sulfa Antibiotics Other (See Comments)   Pt states it felt like she had the flu.   Bactrim [sulfamethoxazole-trimethoprim] Nausea And Vomiting        Medication List        Accurate as of February 14, 2022 12:03 PM. If you have any questions, ask your nurse or doctor.          STOP taking these medications    docusate sodium 100 MG capsule Commonly known as: COLACE Stopped by: Abbie Sons, MD   HYDROcodone-acetaminophen 5-325 MG tablet Commonly known as: NORCO/VICODIN Stopped by:  Abbie Sons, MD   ondansetron 4 MG disintegrating tablet Commonly known as: ZOFRAN-ODT Stopped by: Abbie Sons, MD   oxybutynin 5 MG tablet Commonly known as: DITROPAN Stopped by: Abbie Sons, MD   tamsulosin 0.4 MG Caps capsule Commonly known as: FLOMAX Stopped by: Abbie Sons, MD       TAKE these medications    Alive Womens 50+ Tabs Take 1 tablet by mouth daily.   ALPRAZolam 1 MG tablet Commonly known  as: Xanax Take 1 tablet (1 mg total) by mouth 3 (three) times daily as needed for anxiety.   aspirin 81 MG tablet Take 81 mg by mouth every evening. Reported on 03/02/2015   buPROPion 150 MG 24 hr tablet Commonly known as: WELLBUTRIN XL Take 150 mg by mouth daily.   carvedilol 6.25 MG tablet Commonly known as: COREG Take 6.25 mg by mouth 2 (two) times daily.   furosemide 20 MG tablet Commonly known as: LASIX Take 20 mg by mouth daily.   ibuprofen 200 MG tablet Commonly known as: ADVIL Take 800 mg by mouth daily as needed for moderate pain.   losartan 25 MG tablet Commonly known as: COZAAR Take 25 mg by mouth daily.   rosuvastatin 5 MG tablet Commonly known as: CRESTOR Take 5 mg by mouth at bedtime.   spironolactone 25 MG tablet Commonly known as: ALDACTONE Take 25 mg by mouth daily.   Vitamin C 500 MG Chew Chew 1 tablet by mouth daily.   Vitamin D3 125 MCG (5000 UT) Caps Take by mouth 2 (two) times a week.        Allergies:  Allergies  Allergen Reactions   Sulfa Antibiotics Other (See Comments)    Pt states it felt like she had the flu.   Bactrim [Sulfamethoxazole-Trimethoprim] Nausea And Vomiting    Family History: Family History  Problem Relation Age of Onset   Cancer Paternal Aunt        ovairan   Breast cancer Neg Hx     Social History:  reports that she has never smoked. She has never used smokeless tobacco. She reports that she does not drink alcohol and does not use drugs.   Physical Exam: BP (!) 136/90   Pulse (!) 102   Ht '5\' 4"'$  (1.626 m)   Wt 202 lb (91.6 kg)   LMP 05/27/2016   BMI 34.67 kg/m   Constitutional:  Alert and oriented, No acute distress. HEENT: [place] AT Psychiatric: Normal mood and affect.   Assessment & Plan:   Doing well status post ureteroscopic stone removal She did have a decreased GFR and is scheduled for preoperative lab work next month which will include a creatinine/GFR We discussed her hyperparathyroidism as an  etiology of her stone disease 1 year follow-up with [place], MD  [place] [address] [place], [place] [address] [phone]

## 2022-02-18 DIAGNOSIS — I5022 Chronic systolic (congestive) heart failure: Secondary | ICD-10-CM | POA: Diagnosis not present

## 2022-02-18 DIAGNOSIS — I420 Dilated cardiomyopathy: Secondary | ICD-10-CM | POA: Diagnosis not present

## 2022-02-18 DIAGNOSIS — N200 Calculus of kidney: Secondary | ICD-10-CM | POA: Diagnosis not present

## 2022-02-18 DIAGNOSIS — R7303 Prediabetes: Secondary | ICD-10-CM | POA: Diagnosis not present

## 2022-02-18 DIAGNOSIS — E782 Mixed hyperlipidemia: Secondary | ICD-10-CM | POA: Diagnosis not present

## 2022-03-06 NOTE — Discharge Instructions (Signed)
Instructions after Total Knee Replacement   Piers Baade P. Leovardo Thoman, Jr., M.D.     Dept. of Orthopaedics & Sports Medicine  [facility]  [address]  [place], [place]  [address]  Phone: [phone]   Fax: [phone]    DIET: Drink plenty of non-alcoholic fluids. Resume your normal diet. Include foods high in fiber.  ACTIVITY:  You may use crutches or a walker with weight-bearing as tolerated, unless instructed otherwise. You may be weaned off of the walker or crutches by your Physical Therapist.  Do NOT place pillows under the knee. Anything placed under the knee could limit your ability to straighten the knee.   Continue doing gentle exercises. Exercising will reduce the pain and swelling, increase motion, and prevent muscle weakness.   Please continue to use the TED compression stockings for 6 weeks. You may remove the stockings at night, but should reapply them in the morning. Do not drive or operate any equipment until instructed.  WOUND CARE:  Continue to use the PolarCare or ice packs periodically to reduce pain and swelling. You may bathe or shower after the staples are removed at the first office visit following surgery.  MEDICATIONS: You may resume your regular medications. Please take the pain medication as prescribed on the medication. Do not take pain medication on an empty stomach. You have been given a prescription for a blood thinner (Lovenox or Coumadin). Please take the medication as instructed. (NOTE: After completing a 2 week course of Lovenox, take one Enteric-coated aspirin once a day. This along with elevation will help reduce the possibility of phlebitis in your operated leg.) Do not drive or drink alcoholic beverages when taking pain medications.  CALL THE OFFICE FOR: Temperature above 101 degrees Excessive bleeding or drainage on the dressing. Excessive swelling, coldness, or paleness of the toes. Persistent nausea and vomiting.  FOLLOW-UP:  You  should have an appointment to return to the office in 10-14 days after surgery. Arrangements have been made for continuation of Physical Therapy (either home therapy or outpatient therapy).   [facility] Department Directory         [email or web address]       [email or web address]          Cardiology  Appointments: [place] - [phone] [place] - [phone]  Endocrinology  Appointments: [place] - [phone] [place] - [phone]  Gastroenterology  Appointments: [place] - [phone] [place] - [phone]        General Surgery   Appointments: [place] - [phone]  Internal Medicine/Family Medicine  Appointments: [place] - [phone] [place] - [phone] [place] - [phone]  Metabolic and Weigh Loss Surgery  Appointments: [place] - [phone]        Neurology  Appointments: [place] - [phone] [place] - [phone]  Neurosurgery  Appointments: [place] - [phone]  Obstetrics & Gynecology  Appointments: [place] - [phone] [place] - [phone]        Pediatrics  Appointments: [place] - [phone] [place] - [phone]  Physiatry  Appointments: [place] -[phone]  Physical Therapy  Appointments: [place] - [phone] [place] - [phone]        Podiatry  Appointments: [place] - [phone] [place] - [phone]  Pulmonology  Appointments: [place] - [phone]  Rheumatology  Appointments: [place] - [phone]         Location: [facility]  [address] , [place]  [address]  [place] Location: [facility] [address] [place], [place]  [address]  [place] Location: [facility] [address] [place], [place]  [address]    

## 2022-03-14 ENCOUNTER — Encounter
Admission: RE | Admit: 2022-03-14 | Discharge: 2022-03-14 | Disposition: A | Discharge status: Home / Self Care | Payer: 59 | Source: Ambulatory Visit | Attending: Orthopedic Surgery | Admitting: Orthopedic Surgery

## 2022-03-14 ENCOUNTER — Other Ambulatory Visit: Payer: Self-pay

## 2022-03-14 DIAGNOSIS — R7303 Prediabetes: Secondary | ICD-10-CM | POA: Diagnosis not present

## 2022-03-14 DIAGNOSIS — M1711 Unilateral primary osteoarthritis, right knee: Secondary | ICD-10-CM | POA: Diagnosis not present

## 2022-03-14 DIAGNOSIS — E785 Hyperlipidemia, unspecified: Secondary | ICD-10-CM | POA: Diagnosis not present

## 2022-03-14 DIAGNOSIS — E559 Vitamin D deficiency, unspecified: Secondary | ICD-10-CM | POA: Diagnosis not present

## 2022-03-14 DIAGNOSIS — I447 Left bundle-branch block, unspecified: Secondary | ICD-10-CM | POA: Insufficient documentation

## 2022-03-14 DIAGNOSIS — Z01818 Encounter for other preprocedural examination: Secondary | ICD-10-CM | POA: Insufficient documentation

## 2022-03-14 HISTORY — DX: Presence of automatic (implantable) cardiac defibrillator: Z95.810

## 2022-03-14 HISTORY — DX: Cardiomyopathy, unspecified: I42.9

## 2022-03-14 HISTORY — DX: Presence of cardiac pacemaker: Z95.0

## 2022-03-14 LAB — TYPE AND SCREEN
ABO/RH(D): O POS
Antibody Screen: NEGATIVE

## 2022-03-14 LAB — CBC
HCT: 47.1 % — ABNORMAL HIGH (ref 36.0–46.0)
Hemoglobin: 16.3 g/dL — ABNORMAL HIGH (ref 12.0–15.0)
MCH: 29.5 pg (ref 26.0–34.0)
MCHC: 34.6 g/dL (ref 30.0–36.0)
MCV: 85.2 fL (ref 80.0–100.0)
Platelets: 252 10*3/uL (ref 150–400)
RBC: 5.53 MIL/uL — ABNORMAL HIGH (ref 3.87–5.11)
RDW: 12.2 % (ref 11.5–15.5)
WBC: 5.8 10*3/uL (ref 4.0–10.5)
nRBC: 0 % (ref 0.0–0.2)

## 2022-03-14 LAB — URINALYSIS, ROUTINE W REFLEX MICROSCOPIC
Bilirubin Urine: NEGATIVE
Glucose, UA: NEGATIVE mg/dL
Ketones, ur: NEGATIVE mg/dL
Nitrite: NEGATIVE
Protein, ur: NEGATIVE mg/dL
Specific Gravity, Urine: 1.01 (ref 1.005–1.030)
pH: 5 (ref 5.0–8.0)

## 2022-03-14 LAB — COMPREHENSIVE METABOLIC PANEL
ALT: 25 U/L (ref 0–44)
AST: 24 U/L (ref 15–41)
Albumin: 4.6 g/dL (ref 3.5–5.0)
Alkaline Phosphatase: 72 U/L (ref 38–126)
Anion gap: 9 (ref 5–15)
BUN: 25 mg/dL — ABNORMAL HIGH (ref 6–20)
CO2: 24 mmol/L (ref 22–32)
Calcium: 11 mg/dL — ABNORMAL HIGH (ref 8.9–10.3)
Chloride: 107 mmol/L (ref 98–111)
Creatinine, Ser: 1.12 mg/dL — ABNORMAL HIGH (ref 0.44–1.00)
GFR, Estimated: 57 mL/min — ABNORMAL LOW (ref >60)
Glucose, Bld: 136 mg/dL — ABNORMAL HIGH (ref 70–99)
Potassium: 3.8 mmol/L (ref 3.5–5.1)
Sodium: 140 mmol/L (ref 135–145)
Total Bilirubin: 0.6 mg/dL (ref 0.3–1.2)
Total Protein: 8.5 g/dL — ABNORMAL HIGH (ref 6.5–8.1)

## 2022-03-14 LAB — C-REACTIVE PROTEIN: CRP: 0.7 mg/dL (ref <1.0)

## 2022-03-14 LAB — SURGICAL PCR SCREEN
MRSA, PCR: NEGATIVE
Staphylococcus aureus: NEGATIVE

## 2022-03-14 LAB — SEDIMENTATION RATE: Sed Rate: 11 mm/hr (ref 0–30)

## 2022-03-14 NOTE — Patient Instructions (Addendum)
Your procedure is scheduled on: 03/25/2021  Report to the Registration Desk on the 1st floor of the [facility]. To find out your arrival time, please call [phone] between 1PM - 3PM on: 03/22/2021  If your arrival time is 6:00 am, do not arrive prior to that time as the [facility] entrance doors do not open until 6:00 am.  REMEMBER: Instructions that are not followed completely may result in serious medical risk, up to and including death; or upon the discretion of your surgeon and anesthesiologist your surgery may need to be rescheduled.  Do not eat food after midnight the night before surgery.  No gum chewing, lozengers or hard candies.  You may however, drink CLEAR liquids up to 2 hours before you are scheduled to arrive for your surgery. Do not drink anything within 2 hours of your scheduled arrival time.  Clear liquids include: - water  - apple juice without pulp - gatorade (not RED colors) - black coffee or tea (Do NOT add milk or creamers to the coffee or tea) Do NOT drink anything that is not on this list.   In addition, your doctor has ordered for you to drink the provided  Ensure Pre-Surgery Clear Carbohydrate Drink   Drinking this carbohydrate drink up to two hours before surgery helps to reduce insulin resistance and improve patient outcomes. Please complete drinking 2 hours prior to scheduled arrival time.  TAKE THESE MEDICATIONS THE MORNING OF SURGERY WITH A SIP OF WATER: buPROPion  carvedilol (COREG)    Follow recommendations from Cardiologist or PCP regarding stopping Aspirin, Coumadin, Plavix, Eliquis, Pradaxa, or Pletal. Follow instructions with your aspirin.   One week prior to surgery: Stop Anti-inflammatories (NSAIDS) such as Advil, Aleve, Ibuprofen, Motrin, Naproxen, Naprosyn and Aspirin based products such as Excedrin, Goodys Powder, BC Powder. Stop ANY OVER THE COUNTER supplements until after surgery. You may however, continue to take Tylenol if  needed for pain up until the day of surgery.  No Alcohol for 24 hours before or after surgery.  No Smoking including e-cigarettes for 24 hours prior to surgery.  No chewable tobacco products for at least 6 hours prior to surgery.  No nicotine patches on the day of surgery.  Do not use any "recreational" drugs for at least a week prior to your surgery.  Please be advised that the combination of cocaine and anesthesia may have negative outcomes, up to and including death. If you test positive for cocaine, your surgery will be cancelled.  On the morning of surgery brush your teeth with toothpaste and water, you may rinse your mouth with mouthwash if you wish. Do not swallow any toothpaste or mouthwash.  Use CHG Soap  as directed on instruction sheet.  Do not wear jewelry, make-up, hairpins, clips or nail polish.  Do not wear lotions, powders, or perfumes.   Do not shave body from the neck down 48 hours prior to surgery just in case you cut yourself which could leave a site for infection.  Also, freshly shaved skin may become irritated if using the CHG soap.  Contact lenses, hearing aids and dentures may not be worn into surgery.  Do not bring valuables to the hospital. [facility] is not responsible for any missing/lost belongings or valuables.   Notify your doctor if there is any change in your medical condition (cold, fever, infection).  Wear comfortable clothing (specific to your surgery type) to the hospital.  After surgery, you can help prevent lung complications by  doing breathing exercises.  Take deep breaths and cough every 1-2 hours. Your doctor may order a device called an Incentive Spirometer to help you take deep breaths. If you are being admitted to the hospital overnight, leave your suitcase in the car. After surgery it may be brought to your room.  If you are being discharged the day of surgery, you will not be allowed to drive home. You will need a responsible adult  (18 years or older) to drive you home and stay with you that night.   If you are taking public transportation, you will need to have a responsible adult (18 years or older) with you. Please confirm with your physician that it is acceptable to use public transportation.   Please call the [facility] Dept. at [phone] if you have any questions about these instructions.  Surgery Visitation Policy:  Patients undergoing a surgery or procedure may have two family members or support persons with them as long as the person is not COVID-19 positive or experiencing its symptoms.   Inpatient Visitation:    Visiting hours are 7 a.m. to 8 p.m. Up to four visitors are allowed at one time in a patient room. The visitors may rotate out with other people during the day. One designated support person (adult) may remain overnight.  Due to an increase in RSV and influenza rates and associated hospitalizations, children ages 64 and under will not be able to visit patients in [facility]. Masks continue to be strongly recommended.

## 2022-03-14 NOTE — Progress Notes (Signed)
Abnormal urinalysis results routed to Dr. Marry Guan (surgeon).

## 2022-03-15 LAB — HEMOGLOBIN A1C
Hgb A1c MFr Bld: 5.9 % — ABNORMAL HIGH (ref 4.8–5.6)
Mean Plasma Glucose: 123 mg/dL

## 2022-03-20 ENCOUNTER — Encounter: Admission: RE | Payer: Self-pay | Source: Home / Self Care

## 2022-03-20 SURGERY — ARTHROPLASTY, KNEE, TOTAL, USING IMAGELESS COMPUTER-ASSISTED NAVIGATION
Anesthesia: Choice | Site: Knee | Laterality: Right

## 2022-03-24 ENCOUNTER — Encounter: Payer: Self-pay | Admitting: Orthopedic Surgery

## 2022-03-24 DIAGNOSIS — J309 Allergic rhinitis, unspecified: Secondary | ICD-10-CM | POA: Insufficient documentation

## 2022-03-24 DIAGNOSIS — R55 Syncope and collapse: Secondary | ICD-10-CM | POA: Insufficient documentation

## 2022-03-24 DIAGNOSIS — N399 Disorder of urinary system, unspecified: Secondary | ICD-10-CM | POA: Insufficient documentation

## 2022-03-24 DIAGNOSIS — F439 Reaction to severe stress, unspecified: Secondary | ICD-10-CM | POA: Insufficient documentation

## 2022-03-24 DIAGNOSIS — R599 Enlarged lymph nodes, unspecified: Secondary | ICD-10-CM | POA: Insufficient documentation

## 2022-03-24 DIAGNOSIS — R002 Palpitations: Secondary | ICD-10-CM | POA: Insufficient documentation

## 2022-03-24 DIAGNOSIS — R058 Other specified cough: Secondary | ICD-10-CM | POA: Insufficient documentation

## 2022-03-24 MED ORDER — CEFAZOLIN SODIUM-DEXTROSE 2-4 GM/100ML-% IV SOLN: 2.0000 g | INTRAVENOUS | Status: DC

## 2022-03-24 MED ORDER — DEXAMETHASONE SODIUM PHOSPHATE 10 MG/ML IJ SOLN: 8.0000 mg | Freq: Once | INTRAMUSCULAR | Status: DC

## 2022-03-24 MED ORDER — CHLORHEXIDINE GLUCONATE 0.12 % MT SOLN: 15.0000 mL | Freq: Once | OROMUCOSAL | Status: DC

## 2022-03-24 MED ORDER — FAMOTIDINE 20 MG PO TABS: 20.0000 mg | ORAL_TABLET | Freq: Once | ORAL | Status: AC

## 2022-03-24 MED ORDER — TRANEXAMIC ACID-NACL 1000-0.7 MG/100ML-% IV SOLN: 1000.0000 mg | INTRAVENOUS | Status: DC

## 2022-03-24 MED ORDER — GABAPENTIN 300 MG PO CAPS: 300.0000 mg | ORAL_CAPSULE | Freq: Once | ORAL | Status: DC

## 2022-03-24 MED ORDER — CHLORHEXIDINE GLUCONATE 4 % EX LIQD: 60.0000 mL | Freq: Once | CUTANEOUS | Status: DC

## 2022-03-24 MED ORDER — ORAL CARE MOUTH RINSE: 15.0000 mL | Freq: Once | OROMUCOSAL | Status: DC

## 2022-03-24 MED ORDER — CELECOXIB 200 MG PO CAPS: 400.0000 mg | ORAL_CAPSULE | Freq: Once | ORAL | Status: AC

## 2022-03-24 MED ORDER — SODIUM CHLORIDE 0.9 % IV SOLN: INTRAVENOUS | Status: DC

## 2022-03-24 NOTE — H&P (Signed)
ORTHOPAEDIC HISTORY & PHYSICAL Gwenlyn Fudge, [place] - 03/14/2022 8:45 AM EST Formatting of this note is different from the original. [facility] Chief Complaint:  Chief Complaint Patient presents with H&P - RT TKA - 01.08.24/[facility]  History of Present Illness:  Erin Stuart is a 57 y.o. female that presents to clinic today for her preoperative history and evaluation. Patient presents unaccompanied. The patient is scheduled to undergo a right total knee arthroplasty on 03/25/22 by Dr. Marry Guan. Her pain began many years ago. The pain is located primarily along the medial and anterior aspects of the knee. She describes her pain as worse with weightbearing. She reports associated swelling with some locking and giving way of the knee. She denies associated numbness or tingling. Patient was previously scheduled to undergo total knee arthroplasty on 11/26/2021 but surgery was canceled due to kidney stone.  The patient's symptoms have progressed to the point that they decrease her quality of life. The patient has previously undergone conservative treatment including NSAIDS and injections to the knee without adequate control of her symptoms.  Denies history of lumbar surgery, DVT. Has history of pacemaker and cardiomyopathy, has received cardiac clearance. Patient would prefer not to take Celebrex after surgery as she states it made her feel lightheaded when taken previously. Her husband will be at home to assist postoperatively.  Past Medical, Surgical, Family, Social History, Allergies, Medications:  Past Medical History: Past Medical History: Diagnosis Date Anxiety Bundle branch block, left Cardiomyopathy, secondary (CMS-HCC) Chronic systolic heart failure (CMS-HCC) High cholesterol Hypertension Kidney stones Left bundle branch block 2012 Left bundle branch block Prediabetes 09/29/2021 Vitamin D deficiency  Past Surgical History: Past  Surgical History: Procedure Laterality Date crtd Left INSERT / REPLACE / REMOVE PACEMAKER Kidney stone extraction Wisdom teeth extraction In her 35s  Current Medications: Current Outpatient Medications Medication Sig Dispense Refill acetaminophen/chlorpheniramine (CORICIDIN HBP COLD AND FLU ORAL) Take by mouth once as needed ALPRAZolam (XANAX) 1 MG tablet TAKE 1 TABLET BY MOUTH ONCE DAILY AS NEEDED FOR ANXIETY. 30 tablet 1 aspirin 81 MG EC tablet Take 81 mg by mouth once daily. buPROPion (WELLBUTRIN XL) 150 MG XL tablet TAKE 1 TABLET BY MOUTH EVERY DAY 30 tablet 5 carvediloL (COREG) 6.25 MG tablet TAKE 1 TABLET BY MOUTH TWICE A DAY WITH FOOD 180 tablet 3 FUROsemide (LASIX) 20 MG tablet TAKE 1 TABLET BY MOUTH EVERY DAY 90 tablet 3 losartan (COZAAR) 25 MG tablet TAKE 1 TABLET BY MOUTH EVERY DAY 90 tablet 3 multivitamin-Ca-iron-minerals Tab Take 1 tablet by mouth once daily oxymetazoline (MUCINEX SINUS-MAX) 0.05 % nasal spray Place into both nostrils once as needed for Congestion rosuvastatin (CRESTOR) 5 MG tablet TAKE 1 TABLET BY MOUTH EVERY DAY 90 tablet 3 spironolactone (ALDACTONE) 25 MG tablet TAKE 1 TABLET BY MOUTH EVERY DAY 90 tablet 2 tamsulosin (FLOMAX) 0.4 mg capsule Take 0.4 mg by mouth once daily Take 30 minutes after same meal each day. (Patient not taking: Reported on 03/14/2022) zinc acetate 50 mg (zinc) Cap  No current facility-administered medications for this visit.  Allergies: Allergies Allergen Reactions Sulfamethoxazole-Trimethoprim Nausea And Vomiting  Social History: Social History  Socioeconomic History Marital status: Married Spouse name: Hassell Done Number of children: 3 Years of education: 80 Occupational History Occupation: Unemployed -Photographer Comment: [employer] [place] Tobacco Use Smoking status: Never Smokeless tobacco: Never Vaping Use Vaping Use: Never used Substance and Sexual Activity Alcohol use: Not Currently Comment:  Social, 1 glass of wine every  two months. Drug use: No Sexual activity: Yes Partners: Male Birth control/protection: Surgical Social History Narrative Married, 3 children Stays at home Denies tobacco/drug use Social etoh Caffeine: 4 pepsi daily Exercise: none  Family History: Family History Problem Relation Age of Onset Coronary Artery Disease (Blocked arteries around heart) Mother 14 MI High blood pressure (Hypertension) Mother Hyperlipidemia (Elevated cholesterol) Mother Factor V Leiden deficiency Father Skin cancer Father melanoma High blood pressure (Hypertension) Father Diabetes type II Sister No Known Problems Daughter No Known Problems Son Ovarian cancer Paternal Aunt Diabetes type II Maternal Grandmother Coronary Artery Disease (Blocked arteries around heart) Maternal Grandfather 68 MI x2 No Known Problems Daughter Anesthesia problems Neg Hx  Review of Systems:  A 10+ ROS was performed, reviewed, and the pertinent orthopaedic findings are documented in the HPI.  Physical Examination:  BP 128/82 (BP Location: Left upper arm, Patient Position: Sitting, BP Cuff Size: Adult)  Ht 162.6 cm ('5\' 4"'$ )  Wt 92.6 kg (204 lb 3.2 oz)  BMI 35.05 kg/m  Patient is a well-developed, well-nourished female in no acute distress. Patient has normal mood and affect. Patient is alert and oriented to person, place, and time.  HEENT: Atraumatic, normocephalic. Pupils equal and reactive to light. Extraocular motion intact. Noninjected sclera.  Cardiovascular: Regular rate and rhythm, with no murmurs, rubs, or gallops. Distal pulses palpable.  Respiratory: Lungs clear to auscultation bilaterally.  Right Knee: Soft tissue swelling: minimal Effusion: none Erythema: none Crepitance: mild Tenderness: medial, anterior Alignment: relative varus Mediolateral laxity: medial pseudolaxity Posterior sag: negative Patellar tracking: Good tracking without evidence of subluxation or  tilt Atrophy: No significant atrophy. Quadriceps tone was fair to good. Range of motion: 0/6/105 degrees  Able to plantarflex and dorsiflex the right ankle. Able to flex and extend the toes.  Sensation intact over the saphenous, lateral sural cutaneous, superficial fibular, and deep fibular nerve distributions.  Tests Performed/Reviewed: X-rays  3 views of the right knee were obtained. Images reveal severe loss of medial compartment joint space with significant osteophyte formation. No fractures or dislocations. No osseous abnormality noted.  I personally ordered and interpreted today's x-rays.  Impression:  ICD-10-CM 1. Primary osteoarthritis of right knee M17.11  Plan:  The patient has end-stage degenerative changes of the right knee. It was explained to the patient that the condition is progressive in nature. Having failed conservative treatment, the patient has elected to proceed with a total joint arthroplasty. The patient will undergo a total joint arthroplasty with Dr. Marry Guan. The risks of surgery, including blood clot and infection, were discussed with the patient. Measures to reduce these risks, including the use of anticoagulation, perioperative antibiotics, and early ambulation were discussed. The importance of postoperative physical therapy was discussed with the patient. The patient elects to proceed with surgery. The patient is instructed to stop all blood thinners prior to surgery. The patient is instructed to call the hospital the day before surgery to learn of the proper arrival time.  Contact our office with any questions or concerns. Follow up as indicated, or sooner should any new problems arise, if conditions worsen, or if they are otherwise concerned.  Gwenlyn Fudge, [facility] [place], [place] [address] Phone: [phone]  This note was generated in part with voice recognition software and I  apologize for any typographical errors that were not detected and corrected. Electronically signed by Gwenlyn Fudge, PA at 03/14/2022 5:17 PM EST

## 2022-03-25 ENCOUNTER — Other Ambulatory Visit: Payer: Self-pay

## 2022-03-25 ENCOUNTER — Encounter
Admission: RE | Disposition: A | Discharge status: Home / Self Care | Payer: Self-pay | Source: Home / Self Care | Attending: Orthopedic Surgery

## 2022-03-25 ENCOUNTER — Encounter: Payer: Self-pay | Admitting: Orthopedic Surgery

## 2022-03-25 ENCOUNTER — Observation Stay: Payer: 59

## 2022-03-25 ENCOUNTER — Observation Stay
Admission: RE | Admit: 2022-03-25 | Discharge: 2022-03-26 | Disposition: A | Discharge status: Home / Self Care | Payer: 59 | Attending: Orthopedic Surgery | Admitting: Orthopedic Surgery

## 2022-03-25 ENCOUNTER — Ambulatory Visit: Payer: 59 | Admitting: Certified Registered Nurse Anesthetist

## 2022-03-25 DIAGNOSIS — Z96651 Presence of right artificial knee joint: Secondary | ICD-10-CM | POA: Diagnosis not present

## 2022-03-25 DIAGNOSIS — M1711 Unilateral primary osteoarthritis, right knee: Secondary | ICD-10-CM | POA: Diagnosis not present

## 2022-03-25 DIAGNOSIS — E785 Hyperlipidemia, unspecified: Secondary | ICD-10-CM

## 2022-03-25 DIAGNOSIS — I110 Hypertensive heart disease with heart failure: Secondary | ICD-10-CM | POA: Diagnosis not present

## 2022-03-25 DIAGNOSIS — Z01818 Encounter for other preprocedural examination: Secondary | ICD-10-CM

## 2022-03-25 DIAGNOSIS — Z950 Presence of cardiac pacemaker: Secondary | ICD-10-CM | POA: Insufficient documentation

## 2022-03-25 DIAGNOSIS — E559 Vitamin D deficiency, unspecified: Secondary | ICD-10-CM

## 2022-03-25 DIAGNOSIS — Z7982 Long term (current) use of aspirin: Secondary | ICD-10-CM | POA: Diagnosis not present

## 2022-03-25 DIAGNOSIS — Z96659 Presence of unspecified artificial knee joint: Secondary | ICD-10-CM

## 2022-03-25 DIAGNOSIS — Z79899 Other long term (current) drug therapy: Secondary | ICD-10-CM | POA: Insufficient documentation

## 2022-03-25 DIAGNOSIS — I5022 Chronic systolic (congestive) heart failure: Secondary | ICD-10-CM | POA: Diagnosis not present

## 2022-03-25 DIAGNOSIS — T7840XA Allergy, unspecified, initial encounter: Secondary | ICD-10-CM | POA: Diagnosis not present

## 2022-03-25 DIAGNOSIS — E669 Obesity, unspecified: Secondary | ICD-10-CM | POA: Diagnosis not present

## 2022-03-25 DIAGNOSIS — R7303 Prediabetes: Secondary | ICD-10-CM

## 2022-03-25 DIAGNOSIS — Z6835 Body mass index (BMI) 35.0-35.9, adult: Secondary | ICD-10-CM | POA: Diagnosis not present

## 2022-03-25 DIAGNOSIS — Z471 Aftercare following joint replacement surgery: Secondary | ICD-10-CM | POA: Diagnosis not present

## 2022-03-25 DIAGNOSIS — I447 Left bundle-branch block, unspecified: Secondary | ICD-10-CM

## 2022-03-25 HISTORY — PX: KNEE ARTHROPLASTY: SHX992

## 2022-03-25 LAB — POCT I-STAT, CHEM 8
BUN: 23 mg/dL — ABNORMAL HIGH (ref 6–20)
Calcium, Ion: 1.38 mmol/L (ref 1.15–1.40)
Chloride: 105 mmol/L (ref 98–111)
Creatinine, Ser: 1.1 mg/dL — ABNORMAL HIGH (ref 0.44–1.00)
Glucose, Bld: 126 mg/dL — ABNORMAL HIGH (ref 70–99)
HCT: 41 % (ref 36.0–46.0)
Hemoglobin: 13.9 g/dL (ref 12.0–15.0)
Potassium: 3.9 mmol/L (ref 3.5–5.1)
Sodium: 140 mmol/L (ref 135–145)
TCO2: 21 mmol/L — ABNORMAL LOW (ref 22–32)

## 2022-03-25 SURGERY — ARTHROPLASTY, KNEE, TOTAL, USING IMAGELESS COMPUTER-ASSISTED NAVIGATION
Anesthesia: Spinal | Site: Knee | Laterality: Right

## 2022-03-25 MED ORDER — FUROSEMIDE 20 MG PO TABS: 20.0000 mg | ORAL_TABLET | Freq: Every day | ORAL | Status: DC

## 2022-03-25 MED ORDER — ONDANSETRON HCL 4 MG PO TABS: 4.0000 mg | ORAL_TABLET | Freq: Four times a day (QID) | ORAL | Status: DC | PRN

## 2022-03-25 MED ORDER — MIDAZOLAM HCL 2 MG/2ML IJ SOLN
INTRAMUSCULAR | Status: AC
  Filled 2022-03-25: qty 2

## 2022-03-25 MED ORDER — STERILE WATER FOR IRRIGATION IR SOLN
Status: DC | PRN
  Administered 2022-03-25: 1000 mL

## 2022-03-25 MED ORDER — CARVEDILOL 12.5 MG PO TABS
ORAL_TABLET | ORAL | Status: AC
  Filled 2022-03-25: qty 1

## 2022-03-25 MED ORDER — TRANEXAMIC ACID-NACL 1000-0.7 MG/100ML-% IV SOLN
INTRAVENOUS | Status: AC
  Filled 2022-03-25: qty 100

## 2022-03-25 MED ORDER — FENTANYL CITRATE (PF) 100 MCG/2ML IJ SOLN
INTRAMUSCULAR | Status: DC | PRN
  Administered 2022-03-25 (×2): 50 ug via INTRAVENOUS

## 2022-03-25 MED ORDER — CEFAZOLIN SODIUM-DEXTROSE 2-4 GM/100ML-% IV SOLN: 2.0000 g | Freq: Four times a day (QID) | INTRAVENOUS | Status: AC

## 2022-03-25 MED ORDER — LOSARTAN POTASSIUM 50 MG PO TABS
25.0000 mg | ORAL_TABLET | Freq: Every day | ORAL | Status: DC
  Administered 2022-03-25 – 2022-03-26 (×2): 25 mg via ORAL

## 2022-03-25 MED ORDER — SENNOSIDES-DOCUSATE SODIUM 8.6-50 MG PO TABS
1.0000 | ORAL_TABLET | Freq: Two times a day (BID) | ORAL | Status: DC
  Administered 2022-03-25 – 2022-03-26 (×2): 1 via ORAL

## 2022-03-25 MED ORDER — MAGNESIUM HYDROXIDE 400 MG/5ML PO SUSP
30.0000 mL | Freq: Every day | ORAL | Status: DC
  Administered 2022-03-25 – 2022-03-26 (×2): 30 mL via ORAL

## 2022-03-25 MED ORDER — CELECOXIB 200 MG PO CAPS
ORAL_CAPSULE | ORAL | Status: AC
  Administered 2022-03-25: 400 mg via ORAL
  Filled 2022-03-25: qty 1

## 2022-03-25 MED ORDER — MENTHOL 3 MG MT LOZG: 1.0000 | LOZENGE | OROMUCOSAL | Status: DC | PRN

## 2022-03-25 MED ORDER — PHENYLEPHRINE HCL-NACL 20-0.9 MG/250ML-% IV SOLN
INTRAVENOUS | Status: DC | PRN
  Administered 2022-03-25: 20 ug/min via INTRAVENOUS

## 2022-03-25 MED ORDER — METOCLOPRAMIDE HCL 10 MG PO TABS
ORAL_TABLET | ORAL | Status: AC
  Administered 2022-03-25: 10 mg via ORAL
  Filled 2022-03-25: qty 1

## 2022-03-25 MED ORDER — PROPOFOL 1000 MG/100ML IV EMUL
INTRAVENOUS | Status: AC
  Filled 2022-03-25: qty 100

## 2022-03-25 MED ORDER — OXYCODONE HCL 5 MG PO TABS
5.0000 mg | ORAL_TABLET | ORAL | Status: DC | PRN
  Administered 2022-03-25: 5 mg via ORAL

## 2022-03-25 MED ORDER — DEXMEDETOMIDINE HCL IN NACL 80 MCG/20ML IV SOLN
INTRAVENOUS | Status: AC
  Filled 2022-03-25: qty 20

## 2022-03-25 MED ORDER — OXYCODONE HCL 5 MG PO TABS
ORAL_TABLET | ORAL | Status: AC
  Administered 2022-03-26: 5 mg via ORAL
  Filled 2022-03-25: qty 1

## 2022-03-25 MED ORDER — TRAMADOL HCL 50 MG PO TABS
50.0000 mg | ORAL_TABLET | ORAL | Status: DC | PRN
  Administered 2022-03-26: 100 mg via ORAL

## 2022-03-25 MED ORDER — BUPIVACAINE HCL (PF) 0.25 % IJ SOLN
INTRAMUSCULAR | Status: AC
  Filled 2022-03-25: qty 60

## 2022-03-25 MED ORDER — PANTOPRAZOLE SODIUM 40 MG PO TBEC
40.0000 mg | DELAYED_RELEASE_TABLET | Freq: Two times a day (BID) | ORAL | Status: DC
  Administered 2022-03-25: 40 mg via ORAL
  Filled 2022-03-25: qty 1

## 2022-03-25 MED ORDER — FAMOTIDINE 20 MG PO TABS
ORAL_TABLET | ORAL | Status: AC
  Administered 2022-03-25: 20 mg via ORAL
  Filled 2022-03-25: qty 1

## 2022-03-25 MED ORDER — ROSUVASTATIN CALCIUM 5 MG PO TABS
5.0000 mg | ORAL_TABLET | Freq: Every day | ORAL | Status: DC
  Administered 2022-03-25: 5 mg via ORAL
  Filled 2022-03-25: qty 1

## 2022-03-25 MED ORDER — BUPIVACAINE HCL (PF) 0.5 % IJ SOLN
INTRAMUSCULAR | Status: DC | PRN
  Administered 2022-03-25: 3 mL

## 2022-03-25 MED ORDER — CHLORHEXIDINE GLUCONATE 0.12 % MT SOLN
OROMUCOSAL | Status: AC
  Administered 2022-03-25: 15 mL via OROMUCOSAL
  Filled 2022-03-25: qty 15

## 2022-03-25 MED ORDER — BUPROPION HCL ER (XL) 150 MG PO TB24
150.0000 mg | ORAL_TABLET | Freq: Every day | ORAL | Status: DC
  Administered 2022-03-26: 150 mg via ORAL
  Filled 2022-03-25 (×2): qty 1

## 2022-03-25 MED ORDER — CARVEDILOL 12.5 MG PO TABS
6.2500 mg | ORAL_TABLET | Freq: Two times a day (BID) | ORAL | Status: DC
  Administered 2022-03-26: 6.25 mg via ORAL

## 2022-03-25 MED ORDER — OXYCODONE HCL 5 MG PO TABS: 10.0000 mg | ORAL_TABLET | ORAL | Status: DC | PRN

## 2022-03-25 MED ORDER — HYDROMORPHONE HCL 1 MG/ML IJ SOLN: 0.5000 mg | INTRAMUSCULAR | Status: DC | PRN

## 2022-03-25 MED ORDER — BISACODYL 10 MG RE SUPP: 10.0000 mg | Freq: Every day | RECTAL | Status: DC | PRN

## 2022-03-25 MED ORDER — ACETAMINOPHEN 10 MG/ML IV SOLN
INTRAVENOUS | Status: AC
  Filled 2022-03-25: qty 100

## 2022-03-25 MED ORDER — TRANEXAMIC ACID-NACL 1000-0.7 MG/100ML-% IV SOLN: 1000.0000 mg | Freq: Once | INTRAVENOUS | Status: AC

## 2022-03-25 MED ORDER — FERROUS SULFATE 325 (65 FE) MG PO TABS
ORAL_TABLET | ORAL | Status: AC
  Administered 2022-03-25: 325 mg via ORAL
  Filled 2022-03-25: qty 1

## 2022-03-25 MED ORDER — GLYCOPYRROLATE 0.2 MG/ML IJ SOLN
INTRAMUSCULAR | Status: AC
  Filled 2022-03-25: qty 1

## 2022-03-25 MED ORDER — LOSARTAN POTASSIUM 50 MG PO TABS
ORAL_TABLET | ORAL | Status: AC
  Filled 2022-03-25: qty 1

## 2022-03-25 MED ORDER — ENOXAPARIN SODIUM 30 MG/0.3ML IJ SOSY
30.0000 mg | PREFILLED_SYRINGE | Freq: Two times a day (BID) | INTRAMUSCULAR | Status: DC
  Administered 2022-03-26: 30 mg via SUBCUTANEOUS

## 2022-03-25 MED ORDER — IRRISEPT - 450ML BOTTLE WITH 0.05% CHG IN STERILE WATER, USP 99.95% OPTIME
TOPICAL | Status: DC | PRN
  Administered 2022-03-25: 450 mL

## 2022-03-25 MED ORDER — METOCLOPRAMIDE HCL 10 MG PO TABS
ORAL_TABLET | ORAL | Status: AC
  Administered 2022-03-26: 10 mg via ORAL
  Filled 2022-03-25: qty 1

## 2022-03-25 MED ORDER — TRANEXAMIC ACID-NACL 1000-0.7 MG/100ML-% IV SOLN
INTRAVENOUS | Status: DC | PRN
  Administered 2022-03-25: 1000 mg via INTRAVENOUS

## 2022-03-25 MED ORDER — SENNOSIDES-DOCUSATE SODIUM 8.6-50 MG PO TABS
ORAL_TABLET | ORAL | Status: AC
  Administered 2022-03-25: 1 via ORAL
  Filled 2022-03-25: qty 1

## 2022-03-25 MED ORDER — CEFAZOLIN SODIUM-DEXTROSE 2-4 GM/100ML-% IV SOLN
INTRAVENOUS | Status: AC
  Administered 2022-03-25: 2 g via INTRAVENOUS
  Filled 2022-03-25: qty 100

## 2022-03-25 MED ORDER — FUROSEMIDE 20 MG PO TABS
ORAL_TABLET | ORAL | Status: AC
  Administered 2022-03-25: 20 mg via ORAL
  Filled 2022-03-25: qty 1

## 2022-03-25 MED ORDER — FENTANYL CITRATE (PF) 100 MCG/2ML IJ SOLN
INTRAMUSCULAR | Status: AC
  Filled 2022-03-25: qty 2

## 2022-03-25 MED ORDER — PANTOPRAZOLE SODIUM 40 MG PO TBEC
DELAYED_RELEASE_TABLET | ORAL | Status: AC
  Administered 2022-03-25: 40 mg via ORAL
  Filled 2022-03-25: qty 1

## 2022-03-25 MED ORDER — DIPHENHYDRAMINE HCL 12.5 MG/5ML PO ELIX: 12.5000 mg | ORAL_SOLUTION | ORAL | Status: DC | PRN

## 2022-03-25 MED ORDER — OXYCODONE HCL 5 MG PO TABS
ORAL_TABLET | ORAL | Status: AC
  Administered 2022-03-25: 5 mg via ORAL
  Filled 2022-03-25: qty 1

## 2022-03-25 MED ORDER — PHENOL 1.4 % MT LIQD: 1.0000 | OROMUCOSAL | Status: DC | PRN

## 2022-03-25 MED ORDER — FLEET ENEMA 7-19 GM/118ML RE ENEM: 1.0000 | ENEMA | Freq: Once | RECTAL | Status: DC | PRN

## 2022-03-25 MED ORDER — CELECOXIB 200 MG PO CAPS
200.0000 mg | ORAL_CAPSULE | Freq: Two times a day (BID) | ORAL | Status: DC
  Administered 2022-03-26: 200 mg via ORAL

## 2022-03-25 MED ORDER — GABAPENTIN 300 MG PO CAPS
ORAL_CAPSULE | ORAL | Status: AC
  Administered 2022-03-25: 300 mg
  Filled 2022-03-25: qty 1

## 2022-03-25 MED ORDER — SODIUM CHLORIDE (PF) 0.9 % IJ SOLN
INTRAMUSCULAR | Status: DC | PRN
  Administered 2022-03-25: 120 mL

## 2022-03-25 MED ORDER — PROPOFOL 500 MG/50ML IV EMUL
INTRAVENOUS | Status: DC | PRN
  Administered 2022-03-25: 75 ug/kg/min via INTRAVENOUS

## 2022-03-25 MED ORDER — SODIUM CHLORIDE 0.9 % IV SOLN: INTRAVENOUS | Status: DC

## 2022-03-25 MED ORDER — ACETAMINOPHEN 325 MG PO TABS: 325.0000 mg | ORAL_TABLET | Freq: Four times a day (QID) | ORAL | Status: DC | PRN

## 2022-03-25 MED ORDER — BUPIVACAINE HCL (PF) 0.5 % IJ SOLN
INTRAMUSCULAR | Status: AC
  Filled 2022-03-25: qty 10

## 2022-03-25 MED ORDER — ACETAMINOPHEN 10 MG/ML IV SOLN
1000.0000 mg | Freq: Four times a day (QID) | INTRAVENOUS | Status: AC
  Administered 2022-03-25: 1000 mg via INTRAVENOUS

## 2022-03-25 MED ORDER — ACETAMINOPHEN 10 MG/ML IV SOLN
INTRAVENOUS | Status: DC | PRN
  Administered 2022-03-25: 1000 mg via INTRAVENOUS

## 2022-03-25 MED ORDER — SENNOSIDES-DOCUSATE SODIUM 8.6-50 MG PO TABS
ORAL_TABLET | ORAL | Status: AC
  Filled 2022-03-25: qty 1

## 2022-03-25 MED ORDER — SPIRONOLACTONE 25 MG PO TABS
25.0000 mg | ORAL_TABLET | Freq: Every day | ORAL | Status: DC
  Administered 2022-03-25 – 2022-03-26 (×2): 25 mg via ORAL
  Filled 2022-03-25 (×2): qty 1

## 2022-03-25 MED ORDER — DEXAMETHASONE SODIUM PHOSPHATE 10 MG/ML IJ SOLN
INTRAMUSCULAR | Status: AC
  Administered 2022-03-25: 8 mg
  Filled 2022-03-25: qty 1

## 2022-03-25 MED ORDER — ALUM & MAG HYDROXIDE-SIMETH 200-200-20 MG/5ML PO SUSP
30.0000 mL | ORAL | Status: DC | PRN
  Administered 2022-03-26: 30 mL via ORAL

## 2022-03-25 MED ORDER — CEFAZOLIN SODIUM-DEXTROSE 2-4 GM/100ML-% IV SOLN
INTRAVENOUS | Status: AC
  Filled 2022-03-25: qty 100

## 2022-03-25 MED ORDER — METOCLOPRAMIDE HCL 10 MG PO TABS
10.0000 mg | ORAL_TABLET | Freq: Three times a day (TID) | ORAL | Status: DC
  Administered 2022-03-25: 10 mg via ORAL

## 2022-03-25 MED ORDER — CEFAZOLIN SODIUM-DEXTROSE 2-3 GM-%(50ML) IV SOLR
INTRAVENOUS | Status: DC | PRN
  Administered 2022-03-25: 2 g via INTRAVENOUS

## 2022-03-25 MED ORDER — MIDAZOLAM HCL 5 MG/5ML IJ SOLN
INTRAMUSCULAR | Status: DC | PRN
  Administered 2022-03-25: 2 mg via INTRAVENOUS

## 2022-03-25 MED ORDER — ONDANSETRON HCL 4 MG/2ML IJ SOLN
INTRAMUSCULAR | Status: AC
  Administered 2022-03-25: 4 mg via INTRAVENOUS
  Filled 2022-03-25: qty 2

## 2022-03-25 MED ORDER — TRANEXAMIC ACID-NACL 1000-0.7 MG/100ML-% IV SOLN
INTRAVENOUS | Status: AC
  Administered 2022-03-25: 1000 mg via INTRAVENOUS
  Filled 2022-03-25: qty 100

## 2022-03-25 MED ORDER — DOCUSATE SODIUM 100 MG PO CAPS
ORAL_CAPSULE | ORAL | Status: AC
  Administered 2022-03-25: 100 mg
  Filled 2022-03-25: qty 1

## 2022-03-25 MED ORDER — PANTOPRAZOLE SODIUM 40 MG PO TBEC
DELAYED_RELEASE_TABLET | ORAL | Status: AC
  Filled 2022-03-25: qty 1

## 2022-03-25 MED ORDER — SODIUM CHLORIDE 0.9 % IR SOLN
Status: DC | PRN
  Administered 2022-03-25: 3000 mL

## 2022-03-25 MED ORDER — ACETAMINOPHEN 10 MG/ML IV SOLN
INTRAVENOUS | Status: AC
  Administered 2022-03-25: 1000 mg via INTRAVENOUS
  Filled 2022-03-25: qty 100

## 2022-03-25 MED ORDER — ALPRAZOLAM 0.5 MG PO TABS: 1.0000 mg | ORAL_TABLET | Freq: Three times a day (TID) | ORAL | Status: DC | PRN

## 2022-03-25 MED ORDER — ONDANSETRON HCL 4 MG/2ML IJ SOLN: 4.0000 mg | Freq: Four times a day (QID) | INTRAMUSCULAR | Status: DC | PRN

## 2022-03-25 MED ORDER — MAGNESIUM HYDROXIDE 400 MG/5ML PO SUSP
ORAL | Status: AC
  Filled 2022-03-25: qty 30

## 2022-03-25 MED ORDER — FERROUS SULFATE 325 (65 FE) MG PO TABS
325.0000 mg | ORAL_TABLET | Freq: Two times a day (BID) | ORAL | Status: DC
  Administered 2022-03-26: 325 mg via ORAL

## 2022-03-25 MED ORDER — BUPIVACAINE LIPOSOME 1.3 % IJ SUSP
INTRAMUSCULAR | Status: AC
  Filled 2022-03-25: qty 20

## 2022-03-25 MED ORDER — HYDROMORPHONE HCL 1 MG/ML IJ SOLN
INTRAMUSCULAR | Status: AC
  Administered 2022-03-25: 0.5 mg via INTRAVENOUS
  Filled 2022-03-25: qty 0.5

## 2022-03-25 SURGICAL SUPPLY — 77 items
ATTUNE PSFEM RTSZ5 NARCEM KNEE (Femur) IMPLANT
ATTUNE PSRP INSE SZ5 7 KNEE (Insert) IMPLANT
BASEPLATE TIBIAL ROTATING SZ 4 (Knees) IMPLANT
BATTERY INSTRU NAVIGATION (MISCELLANEOUS) ×4 IMPLANT
BLADE SAW 70X12.5 (BLADE) ×1 IMPLANT
BLADE SAW 90X13X1.19 OSCILLAT (BLADE) ×1 IMPLANT
BLADE SAW 90X25X1.19 OSCILLAT (BLADE) ×1 IMPLANT
BONE CEMENT GENTAMICIN (Cement) ×2 IMPLANT
CEMENT BONE GENTAMICIN 40 (Cement) IMPLANT
CEMENT HV SMART SET (Cement) IMPLANT
COOLER POLAR GLACIER W/PUMP (MISCELLANEOUS) ×1 IMPLANT
CUFF TOURN SGL QUICK 24 (TOURNIQUET CUFF)
CUFF TOURN SGL QUICK 34 (TOURNIQUET CUFF)
CUFF TRNQT CYL 24X4X16.5-23 (TOURNIQUET CUFF) IMPLANT
CUFF TRNQT CYL 34X4.125X (TOURNIQUET CUFF) IMPLANT
DRAPE 3/4 80X56 (DRAPES) ×1 IMPLANT
DRAPE INCISE IOBAN 66X45 STRL (DRAPES) IMPLANT
DRSG MEPILEX SACRM 8.7X9.8 (GAUZE/BANDAGES/DRESSINGS) ×1 IMPLANT
DRSG NON-ADHERENT DERMACEA 3X4 (GAUZE/BANDAGES/DRESSINGS) ×1 IMPLANT
DRSG OPSITE POSTOP 4X14 (GAUZE/BANDAGES/DRESSINGS) ×1 IMPLANT
DRSG TEGADERM 4X4.75 (GAUZE/BANDAGES/DRESSINGS) ×1 IMPLANT
DURAPREP 26ML APPLICATOR (WOUND CARE) ×2 IMPLANT
ELECT CAUTERY BLADE 6.4 (BLADE) ×1 IMPLANT
ELECT REM PT RETURN 9FT ADLT (ELECTROSURGICAL) ×1
ELECTRODE REM PT RTRN 9FT ADLT (ELECTROSURGICAL) ×1 IMPLANT
EX-PIN ORTHOLOCK NAV 4X150 (PIN) ×2 IMPLANT
GLOVE BIOGEL M STRL SZ7.5 (GLOVE) ×2 IMPLANT
GLOVE BIOGEL PI IND STRL 7.5 (GLOVE) ×1 IMPLANT
GLOVE PI ORTHO PRO STRL 7.5 (GLOVE) ×2 IMPLANT
GLOVE SRG 8 PF TXTR STRL LF DI (GLOVE) ×1 IMPLANT
GLOVE SURG UNDER POLY LF SZ7.5 (GLOVE) ×1 IMPLANT
GLOVE SURG UNDER POLY LF SZ8 (GLOVE) ×1
GOWN STRL REUS W/ TWL LRG LVL3 (GOWN DISPOSABLE) ×1 IMPLANT
GOWN STRL REUS W/ TWL XL LVL3 (GOWN DISPOSABLE) ×1 IMPLANT
GOWN STRL REUS W/TWL LRG LVL3 (GOWN DISPOSABLE) ×1
GOWN STRL REUS W/TWL XL LVL3 (GOWN DISPOSABLE) ×1
GOWN TOGA ZIPPER T7+ PEEL AWAY (MISCELLANEOUS) ×2 IMPLANT
HEMOVAC 400CC 10FR (MISCELLANEOUS) ×1 IMPLANT
HOLDER FOLEY CATH W/STRAP (MISCELLANEOUS) ×1 IMPLANT
HOOD PEEL AWAY FLYTE STAYCOOL (MISCELLANEOUS) IMPLANT
IV NS IRRIG 3000ML ARTHROMATIC (IV SOLUTION) ×1 IMPLANT
JET LAVAGE IRRISEPT WOUND (IRRIGATION / IRRIGATOR) ×1
KIT TURNOVER KIT A (KITS) ×1 IMPLANT
KNIFE SCULPS 14X20 (INSTRUMENTS) ×1 IMPLANT
LAVAGE JET IRRISEPT WOUND (IRRIGATION / IRRIGATOR) IMPLANT
MANIFOLD NEPTUNE II (INSTRUMENTS) ×2 IMPLANT
NDL SPNL 20GX3.5 QUINCKE YW (NEEDLE) ×2 IMPLANT
NEEDLE SPNL 20GX3.5 QUINCKE YW (NEEDLE) ×2 IMPLANT
NS IRRIG 500ML POUR BTL (IV SOLUTION) ×1 IMPLANT
PACK TOTAL KNEE (MISCELLANEOUS) ×1 IMPLANT
PAD ABD DERMACEA PRESS 5X9 (GAUZE/BANDAGES/DRESSINGS) ×2 IMPLANT
PAD WRAPON POLAR KNEE (MISCELLANEOUS) ×1 IMPLANT
PATELLA MEDIAL ATTUN 35MM KNEE (Knees) IMPLANT
PIN DRILL FIX HALF THREAD (BIT) ×2 IMPLANT
PIN DRILL QUICK PACK ×2 IMPLANT
PIN FIXATION 1/8DIA X 3INL (PIN) ×1 IMPLANT
PULSAVAC PLUS IRRIG FAN TIP (DISPOSABLE) ×1
SOL PREP PVP 2OZ (MISCELLANEOUS) ×1
SOLUTION IRRIG SURGIPHOR (IV SOLUTION) ×1 IMPLANT
SOLUTION PREP PVP 2OZ (MISCELLANEOUS) ×1 IMPLANT
SPONGE DRAIN TRACH 4X4 STRL 2S (GAUZE/BANDAGES/DRESSINGS) ×1 IMPLANT
STAPLER SKIN PROX 35W (STAPLE) ×1 IMPLANT
STOCKINETTE IMPERV 14X48 (MISCELLANEOUS) ×1 IMPLANT
STRAP TIBIA SHORT (MISCELLANEOUS) ×1 IMPLANT
SUCTION FRAZIER HANDLE 10FR (MISCELLANEOUS) ×1
SUCTION TUBE FRAZIER 10FR DISP (MISCELLANEOUS) ×1 IMPLANT
SUT VIC AB 0 CT1 36 (SUTURE) ×1 IMPLANT
SUT VIC AB 1 CT1 36 (SUTURE) ×2 IMPLANT
SUT VIC AB 2-0 CT2 27 (SUTURE) ×1 IMPLANT
SYR 30ML LL (SYRINGE) ×2 IMPLANT
TIP FAN IRRIG PULSAVAC PLUS (DISPOSABLE) ×1 IMPLANT
TOWEL OR 17X26 4PK STRL BLUE (TOWEL DISPOSABLE) IMPLANT
TOWER CARTRIDGE SMART MIX (DISPOSABLE) ×1 IMPLANT
TRAP FLUID SMOKE EVACUATOR (MISCELLANEOUS) ×1 IMPLANT
TRAY FOLEY MTR SLVR 16FR STAT (SET/KITS/TRAYS/PACK) ×1 IMPLANT
WATER STERILE IRR 1000ML POUR (IV SOLUTION) ×1 IMPLANT
WRAPON POLAR PAD KNEE (MISCELLANEOUS) ×1

## 2022-03-25 NOTE — Op Note (Signed)
OPERATIVE NOTE  DATE OF SURGERY:  03/25/2022  PATIENT NAME:  Erin Stuart   DOB: 12/02/1965  MRN: [id number]  PRE-OPERATIVE DIAGNOSIS: Degenerative arthrosis of the right knee, primary  POST-OPERATIVE DIAGNOSIS:  Same  PROCEDURE:  Right total knee arthroplasty using computer-assisted navigation  SURGEON:  Marciano Sequin. M.D.  ANESTHESIA: spinal  ESTIMATED BLOOD LOSS: 50 mL  FLUIDS REPLACED: 800 mL of crystalloid  TOURNIQUET TIME: 78 minutes  DRAINS: 2 medium Hemovac drains  SOFT TISSUE RELEASES: Anterior cruciate ligament, posterior cruciate ligament, deep medial collateral ligament, patellofemoral ligament  IMPLANTS UTILIZED: DePuy Attune size 5N posterior stabilized femoral component (cemented), size 4 rotating platform tibial component (cemented), 35 mm medialized dome patella (cemented), and a 7 mm stabilized rotating platform polyethylene insert.  INDICATIONS FOR SURGERY: Erin Stuart is a 57 y.o. year old female with a long history of progressive knee pain. X-rays demonstrated severe degenerative changes in tricompartmental fashion. The patient had not seen any significant improvement despite conservative nonsurgical intervention. After discussion of the risks and benefits of surgical intervention, the patient expressed understanding of the risks benefits and agree with plans for total knee arthroplasty.   The risks, benefits, and alternatives were discussed at length including but not limited to the risks of infection, bleeding, nerve injury, stiffness, blood clots, the need for revision surgery, cardiopulmonary complications, among others, and they were willing to proceed.  PROCEDURE IN DETAIL: The patient was brought into the operating room and, after adequate spinal anesthesia was achieved, a tourniquet was placed on the patient's upper thigh. The patient's knee and leg were cleaned and prepped with alcohol and DuraPrep and draped in the usual sterile fashion. A  "timeout" was performed as per usual protocol. The lower extremity was exsanguinated using an Esmarch, and the tourniquet was inflated to 300 mmHg. An anterior longitudinal incision was made followed by a standard mid vastus approach. The deep fibers of the medial collateral ligament were elevated in a subperiosteal fashion off of the medial flare of the tibia so as to maintain a continuous soft tissue sleeve. The patella was subluxed laterally and the patellofemoral ligament was incised. Inspection of the knee demonstrated severe degenerative changes with full-thickness loss of articular cartilage. Osteophytes were debrided using a rongeur. Anterior and posterior cruciate ligaments were excised. Two 4.0 mm Schanz pins were inserted in the femur and into the tibia for attachment of the array of trackers used for computer-assisted navigation. Hip center was identified using a circumduction technique. Distal landmarks were mapped using the computer. The distal femur and proximal tibia were mapped using the computer. The distal femoral cutting guide was positioned using computer-assisted navigation so as to achieve a 5 distal valgus cut. The femur was sized and it was felt that a size 5N femoral component was appropriate. A size 5 femoral cutting guide was positioned and the anterior cut was performed and verified using the computer. This was followed by completion of the posterior and chamfer cuts. Femoral cutting guide for the central box was then positioned in the center box cut was performed.  Attention was then directed to the proximal tibia. Medial and lateral menisci were excised. The extramedullary tibial cutting guide was positioned using computer-assisted navigation so as to achieve a 0 varus-valgus alignment and 3 posterior slope. The cut was performed and verified using the computer. The proximal tibia was sized and it was felt that a size 4 tibial tray was appropriate. Tibial and femoral trials were  inserted followed  by insertion of a 7 mm polyethylene insert. This allowed for excellent mediolateral soft tissue balancing both in flexion and in full extension. Finally, the patella was cut and prepared so as to accommodate a 35 mm medialized dome patella. A patella trial was placed and the knee was placed through a range of motion with excellent patellar tracking appreciated. The femoral trial was removed after debridement of posterior osteophytes. The central post-hole for the tibial component was reamed followed by insertion of a keel punch. Tibial trials were then removed. Cut surfaces of bone were irrigated with copious amounts of normal saline using pulsatile lavage and then suctioned dry. Polymethylmethacrylate cement with gentamicin was prepared in the usual fashion using a vacuum mixer. Cement was applied to the cut surface of the proximal tibia as well as along the undersurface of a size 4 rotating platform tibial component. Tibial component was positioned and impacted into place. Excess cement was removed using [employer] fast streamer. Cement was then applied to the cut surfaces of the femur as well as along the posterior flanges of the size 5N femoral component. The femoral component was positioned and impacted into place. Excess cement was removed using [employer] fast streamer. A 7 mm polyethylene trial was inserted and the knee was brought into full extension with steady axial compression applied. Finally, cement was applied to the backside of a 35 mm medialized dome patella and the patellar component was positioned and patellar clamp applied. Excess cement was removed using [employer] fast streamer. After adequate curing of the cement, the tourniquet was deflated after a total tourniquet time of 78 minutes. Hemostasis was achieved using electrocautery. The knee was irrigated with copious amounts of normal saline using pulsatile lavage followed by 450 ml of Irrisept and then suctioned dry. 20 mL of 1.3% Exparel and 60 mL of  0.25% Marcaine in 40 mL of normal saline was injected along the posterior capsule, medial and lateral gutters, and along the arthrotomy site. A 7 mm stabilized rotating platform polyethylene insert was inserted and the knee was placed through a range of motion with excellent mediolateral soft tissue balancing appreciated and excellent patellar tracking noted. 2 medium drains were placed in the wound bed and brought out through separate stab incisions. The medial parapatellar portion of the incision was reapproximated using interrupted sutures of #1 Vicryl. Subcutaneous tissue was approximated in layers using first #0 Vicryl followed #2-0 Vicryl. The skin was approximated with skin staples. A sterile dressing was applied.  The patient tolerated the procedure well and was transported to the recovery room in stable condition.    Neilani Duffee P. Holley Bouche., M.D.

## 2022-03-25 NOTE — Anesthesia Preprocedure Evaluation (Signed)
Anesthesia Evaluation  Patient identified by MRN, date of birth, ID band Patient awake    Reviewed: Allergy & Precautions, NPO status , Patient's Chart, lab work & pertinent test results  History of Anesthesia Complications (+) history of anesthetic complications  Airway Mallampati: II  TM Distance: >3 FB Neck ROM: full    Dental  (+) Teeth Intact   Pulmonary asthma    Pulmonary exam normal        Cardiovascular Exercise Tolerance: Good (-) angina +CHF  (-) DOE Normal cardiovascular exam+ Cardiac Defibrillator   INTERPRETATION  MILD LV SYSTOLIC DYSFUNCTION (See above)   WITH MILD LVH  NORMAL RIGHT VENTRICULAR SYSTOLIC FUNCTION  MILD VALVULAR REGURGITATION (See above)  NO VALVULAR STENOSIS  ESTIMATED LVEF 45-50%  Aortic: TRIVIAL AI  Mitral: TRIVIAL MR  Tricuspid: MILD TR (2.50ms) *PACEMAKER WIRE NOTED  Pulmonic: MILD PI  MILDLY DILATED ASCENDING AORTA MEASURING UP TO 3.8cm  MILDLY DILATED AORTIC ROOT MEASURING 3.5cm  Closest EF: 45% (Estimated)  Contraction: MILD GLOBAL DECREASE  Mitral: TRIVIAL MR  Aortic: TRIVIAL AR   Cardiology Visit 10/10   Interrogation of her device realizes it has 2.5 years of battery life remaining. Device is functioning normally. She is approximately 98% V paced. She recently underwent an echocardiogram from 2023 which showed improvement in her LV function to 40 to 45%. This was unchanged from her previous echo 1 year earlier. She is able to be more active without difficulty.   Neuro/Psych  PSYCHIATRIC DISORDERS Anxiety Depression    negative neurological ROS     GI/Hepatic negative GI ROS, Neg liver ROS,,,  Endo/Other  negative endocrine ROS  Hyperparathyroid   Renal/GU negative Renal ROS  negative genitourinary   Musculoskeletal  (+) Arthritis ,    Abdominal   Peds  Hematology negative hematology ROS (+)   Anesthesia Other Findings Past Medical History: 05/22/2021: Acquired  dilation of ascending aorta and aortic root (HCC)     Comment:  a.) TTE 05/22/2021: Ao root 3.8 cm; asc Ao 3.5 cm No date: Anginal pain (HCC) No date: Anxiety     Comment:  a.) on BZO (alprazolam) PRN No date: Arthritis No date: Asthma No date: CHF (congestive heart failure) (HCC) No date: Depression No date: Dilated cardiomyopathy ([facility]     Comment:  a.) R/LHC 09/02/2016: EF <25%; b.) TTE 08/13/2016: EF               <15%; c.) MPI 08/14/2016: EF 21%; d.) cMRI 09/12/2016: EF              22%, no LGE; e.) s/p CRT-D placement 11/27/2016; f.) TTE               01/09/2017: EF 25%; g.) TTE 09/11/2017: EF 25-30%; h.)               TTE 10/09/2018: EF 35%; i.) TTE 09/10/2019: EF 45%; j.)               TTE 08/04/2020: EF 45%; k.) TTE 05/22/2021: EF 45% No date: Diverticulosis No date: History of kidney stones No date: Hyperlipidemia No date: Hyperparathyroidism ([facility] 2012: LBBB (left bundle branch block) No date: PONV (postoperative nausea and vomiting)     Comment:  a.) single remote episode No date: Pre-diabetes 11/27/2016: Presence of cardiac resynchronization therapy  defibrillator (CRT-D) 08/13/2016: Pulmonary HTN ([place]     Comment:  a.) TTE 08/13/2017: RVSP 41.2; b.) TTE 01/09/2017: RVSP  36.9; c.) TTE 09/11/2017: RVSP 30.4 No date: Vitamin D deficiency  Past Surgical History: 11/27/2016: BI-VENTRICULAR IMPLANTABLE CARDIOVERTER DEFIBRILLATOR   (CRT-D); N/A     Comment:  Procedure: BI-VENTRICULAR IMPLANTABLE CARDIOVERTER               DEFIBRILLATOR  (CRT-D); Location: [facility]; Surgeon: Mylinda Latina, MD 06/16/2020: COLONOSCOPY WITH PROPOFOL; N/A     Comment:  Procedure: COLONOSCOPY WITH PROPOFOL;  Surgeon: Jonathon Bellows, MD;  Location: [facility] ENDOSCOPY;  Service:               Gastroenterology;  Laterality: N/A; 11/29/2021: EXTRACORPOREAL SHOCK WAVE LITHOTRIPSY; Left     Comment:  procedure not done 08/27/2016: RIGHT/LEFT HEART CATH AND CORONARY  ANGIOGRAPHY; Bilateral     Comment:  Procedure: Right/Left Heart Cath and Coronary               Angiography;  Surgeon: Teodoro Spray, MD;  Location:               [place] [facility];  Service: Cardiovascular;                Laterality: Bilateral; No date: WISDOM TOOTH EXTRACTION  BMI    Body Mass Index: 34.33 kg/m      Reproductive/Obstetrics negative OB ROS                              Anesthesia Physical Anesthesia Plan  ASA: 3  Anesthesia Plan: Spinal   Post-op Pain Management: Celebrex PO (pre-op)* and Gabapentin PO (pre-op)*   Induction: Intravenous  PONV Risk Score and Plan: 3 and 1 and Ondansetron, Dexamethasone, Propofol infusion, TIVA and Midazolam  Airway Management Planned: Natural Airway  Additional Equipment: None  Intra-op Plan:   Post-operative Plan:   Informed Consent: I have reviewed the patients History and Physical, chart, labs and discussed the procedure including the risks, benefits and alternatives for the proposed anesthesia with the patient or authorized representative who has indicated his/her understanding and acceptance.       Plan Discussed with: CRNA and Surgeon  Anesthesia Plan Comments: (Discussed R/B/A of neuraxial anesthesia technique with patient: - rare risks of spinal/epidural hematoma, nerve damage, infection - Risk of PDPH - Risk of nausea and vomiting - Risk of conversion to general anesthesia and its associated risks, including sore throat, damage to lips/eyes/teeth/oropharynx, and rare risks such as cardiac and respiratory events. - Risk of allergic reactions  Discussed the role of CRNA in patient's perioperative care.  Patient voiced understanding.)         Anesthesia Quick Evaluation

## 2022-03-25 NOTE — Anesthesia Procedure Notes (Addendum)
Procedure Name: MAC Date/Time: 03/25/2022 7:44 AM  Performed by: Demetrius Charity, CRNAPre-anesthesia Checklist: Patient identified, Emergency Drugs available, Suction available, Patient being monitored and Timeout performed Patient Re-evaluated:Patient Re-evaluated prior to induction Oxygen Delivery Method: Simple face mask Induction Type: IV induction Placement Confirmation: CO2 detector and positive ETCO2

## 2022-03-25 NOTE — Anesthesia Procedure Notes (Signed)
Spinal  Patient location during procedure: OR Reason for block: surgical anesthesia Staffing Performed: anesthesiologist  Anesthesiologist: Arita Miss, MD Resident/CRNA: Demetrius Charity, CRNA Performed by: Arita Miss, MD Authorized by: Arita Miss, MD   Preanesthetic Checklist Completed: patient identified, IV checked, site marked, risks and benefits discussed, surgical consent, monitors and equipment checked, pre-op evaluation and timeout performed Spinal Block Patient position: sitting Prep: ChloraPrep and site prepped and draped Patient monitoring: heart rate, continuous pulse ox, blood pressure and cardiac monitor Approach: midline Location: L3-4 Injection technique: single-shot Needle Needle type: Quincke  Needle gauge: 22 G Needle length: 9 cm Assessment Sensory level: T10 Events: CSF return Additional Notes One unsuccessful attempt by CRNA, followed by successful attempt by MD. Meticulous sterile technique used throughout (CHG prep, sterile gloves, sterile drape). Negative paresthesia. Negative blood return. Positive free-flowing CSF. Expiration date of kit checked and confirmed. Patient tolerated procedure well, without complications.

## 2022-03-25 NOTE — Evaluation (Signed)
Physical Therapy Evaluation Patient Details Name: Erin Stuart MRN: [id number] DOB: 05/24/1965 Today's Date: 03/25/2022  History of Present Illness  Erin Stuart is a 41yoF who comes to [facility] for elective TKA with Dr. Marry Guan.  Clinical Impression  Pt has excellent pain control, able to SLR, no assist needed for OOB or coming to standing. Assisted to BR, then AMB a bit more. Pt follows cues for step through gait with success. Safe use of RW note. Husband attending who plans to help at DC. Husband educated on use of polarcare. Pt up to recliner at EOS, heel elevated on roll in TKE stretch. No signs/symptoms of post anesthesia complications.      Recommendations for follow up therapy are one component of a multi-disciplinary discharge planning process, led by the attending physician.  Recommendations may be updated based on patient status, additional functional criteria and insurance authorization.  Follow Up Recommendations Follow physician's recommendations for discharge plan and follow up therapies      Assistance Recommended at Discharge Set up Supervision/Assistance  Patient can return home with the following  Assist for transportation;Assistance with cooking/housework;Help with stairs or ramp for entrance    Equipment Recommendations Rolling walker (2 wheels) (already has a BSC)  Recommendations for Other Services       Functional Status Assessment Patient has had a recent decline in their functional status and demonstrates the ability to make significant improvements in function in a reasonable and predictable amount of time.     Precautions / Restrictions Precautions Precautions: Fall Restrictions Weight Bearing Restrictions: Yes RLE Weight Bearing: Weight bearing as tolerated      Mobility  Bed Mobility Overal bed mobility: Modified Independent                  Transfers Overall transfer level: Needs assistance Equipment used: Rolling walker (2  wheels) Transfers: Sit to/from Stand Sit to Stand: Supervision           General transfer comment: from EOB, from toilet    Ambulation/Gait Ambulation/Gait assistance: Min guard Gait Distance (Feet): 60 Feet Assistive device: Rolling walker (2 wheels) Gait Pattern/deviations: Step-through pattern       General Gait Details: able to achieve step-through after cue  Stairs            Wheelchair Mobility    Modified Rankin (Stroke Patients Only)       Balance Overall balance assessment: Modified Independent, No apparent balance deficits (not formally assessed)                                           Pertinent Vitals/Pain Pain Assessment Pain Assessment: No/denies pain    Home Living Family/patient expects to be discharged to:: Private residence Living Arrangements: Spouse/significant other Available Help at Discharge: Family Type of Home: House Home Access: Stairs to enter Entrance Stairs-Rails: Left Entrance Stairs-Number of Steps: 2   Home Layout: Two level;Able to live on main level with bedroom/bathroom Home Equipment: Rolling Walker (2 wheels);BSC/3in1 Additional Comments: Pt will use deceased mother's RW that has never been used really    Prior Function Prior Level of Function : Independent/Modified Independent                     Hand Dominance        Extremity/Trunk Assessment  Communication      Cognition Arousal/Alertness: Awake/alert Behavior During Therapy: WFL for tasks assessed/performed Overall Cognitive Status: Within Functional Limits for tasks assessed                                          General Comments      Exercises Total Joint Exercises Ankle Circles/Pumps: AROM, Both, 5 reps Straight Leg Raises: AROM, Right Goniometric ROM: 8 degrees to 64 degrees Rt knee flexion ROM   Assessment/Plan    PT Assessment Patient needs continued PT services  PT  Problem List Decreased activity tolerance;Decreased range of motion       PT Treatment Interventions DME instruction;Gait training;Stair training;Functional mobility training;Therapeutic exercise;Balance training;Therapeutic activities;Patient/family education    PT Goals (Current goals can be found in the Care Plan section)  Acute Rehab PT Goals Patient Stated Goal: walk normal PT Goal Formulation: With patient Time For Goal Achievement: 04/08/22 Potential to Achieve Goals: Good    Frequency BID     Co-evaluation               AM-PAC PT "6 Clicks" Mobility  Outcome Measure Help needed turning from your back to your side while in a flat bed without using bedrails?: None Help needed moving from lying on your back to sitting on the side of a flat bed without using bedrails?: None Help needed moving to and from a bed to a chair (including a wheelchair)?: None Help needed standing up from a chair using your arms (e.g., wheelchair or bedside chair)?: None Help needed to walk in hospital room?: A Little Help needed climbing 3-5 steps with a railing? : A Little 6 Click Score: 22    End of Session Equipment Utilized During Treatment: Gait belt Activity Tolerance: Patient tolerated treatment well;No increased pain Patient left: in chair;with family/visitor present;with call bell/phone within reach Nurse Communication: Mobility status PT Visit Diagnosis: Other abnormalities of gait and mobility (R26.89)    Time: 5830-9407 PT Time Calculation (min) (ACUTE ONLY): 36 min   Charges:   PT Evaluation $PT Eval Low Complexity: 1 Low PT Treatments $Therapeutic Activity: 8-22 mins       4:15 PM, 03/25/22 Etta Grandchild, PT, DPT Physical Therapist - [facility]  [phone] ([place])    Deaven Urwin C 03/25/2022, 4:13 PM

## 2022-03-25 NOTE — Transfer of Care (Addendum)
Immediate Anesthesia Transfer of Care Note  Patient: Saleemah Mollenhauer Lomb[place]  Procedure(s) Performed: COMPUTER ASSISTED TOTAL KNEE ARTHROPLASTY - RNFA (Right: Knee)  Patient Location: PACU  Anesthesia Type:Spinal  Level of Consciousness: awake, alert , and oriented  Airway & Oxygen Therapy: Patient Spontanous Breathing  Post-op Assessment: Report given to RN and Post -op Vital signs reviewed and stable  Post vital signs: Reviewed and stable  Last Vitals:  Vitals Value Taken Time  BP 99/55 03/25/22 1035  Temp    Pulse 67 03/25/22 1041  Resp 18 03/25/22 1041  SpO2 95 % 03/25/22 1041  Vitals shown include unvalidated device data.  Last Pain:  Vitals:   03/25/22 0622  TempSrc: Oral  PainSc: 0-No pain         Complications: No notable events documented.

## 2022-03-25 NOTE — TOC Progression Note (Signed)
Transition of Care [facility]) - Progression Note    Patient Details  Name: Erin Stuart MRN: [id number] Date of Birth: 1965/04/04  Transition of [facility]) CM/SW [place], RN Phone Number: 03/25/2022, 2:01 PM  Clinical Narrative:     This patient is set up with [facility] for [facility] services prior to surgery by Surgeons office       Expected Discharge Plan and Services                                               Social Determinants of Health (SDOH) Interventions [place]: No Food Insecurity (03/25/2022)  Housing: Low Risk  (03/25/2022)  Transportation Needs: No Transportation Needs (03/25/2022)  Utilities: Not At Risk (03/25/2022)  Tobacco Use: Low Risk  (03/25/2022)    Readmission Risk Interventions     No data to display

## 2022-03-25 NOTE — Interval H&P Note (Signed)
History and Physical Interval Note:  03/25/2022 6:12 AM  Erin Stuart  has presented today for surgery, with the diagnosis of PRIMARY OSTEOARTHRITIS OF RIGHT KNEE..  The various methods of treatment have been discussed with the patient and family. After consideration of risks, benefits and other options for treatment, the patient has consented to  Procedure(s): COMPUTER ASSISTED TOTAL KNEE ARTHROPLASTY - RNFA (Right) as a surgical intervention.  The patient's history has been reviewed, patient examined, no change in status, stable for surgery.  I have reviewed the patient's chart and labs.  Questions were answered to the patient's satisfaction.     [place]

## 2022-03-26 ENCOUNTER — Encounter: Payer: Self-pay | Admitting: Orthopedic Surgery

## 2022-03-26 DIAGNOSIS — Z7982 Long term (current) use of aspirin: Secondary | ICD-10-CM | POA: Diagnosis not present

## 2022-03-26 DIAGNOSIS — I420 Dilated cardiomyopathy: Secondary | ICD-10-CM | POA: Diagnosis not present

## 2022-03-26 DIAGNOSIS — Z79899 Other long term (current) drug therapy: Secondary | ICD-10-CM | POA: Diagnosis not present

## 2022-03-26 DIAGNOSIS — M1711 Unilateral primary osteoarthritis, right knee: Secondary | ICD-10-CM | POA: Diagnosis not present

## 2022-03-26 DIAGNOSIS — Z96651 Presence of right artificial knee joint: Secondary | ICD-10-CM | POA: Diagnosis not present

## 2022-03-26 DIAGNOSIS — I110 Hypertensive heart disease with heart failure: Secondary | ICD-10-CM | POA: Diagnosis not present

## 2022-03-26 DIAGNOSIS — Z950 Presence of cardiac pacemaker: Secondary | ICD-10-CM | POA: Diagnosis not present

## 2022-03-26 DIAGNOSIS — I5022 Chronic systolic (congestive) heart failure: Secondary | ICD-10-CM | POA: Diagnosis not present

## 2022-03-26 DIAGNOSIS — Z96653 Presence of artificial knee joint, bilateral: Secondary | ICD-10-CM | POA: Diagnosis not present

## 2022-03-26 MED ORDER — OXYCODONE HCL 5 MG PO TABS
ORAL_TABLET | ORAL | Status: AC
  Filled 2022-03-26: qty 1

## 2022-03-26 MED ORDER — ACETAMINOPHEN 10 MG/ML IV SOLN
INTRAVENOUS | Status: AC
  Filled 2022-03-26: qty 100

## 2022-03-26 MED ORDER — ENOXAPARIN SODIUM 40 MG/0.4ML IJ SOSY: 40.0000 mg | PREFILLED_SYRINGE | INTRAMUSCULAR | 0 refills | Status: DC

## 2022-03-26 MED ORDER — OXYCODONE HCL 5 MG PO TABS
ORAL_TABLET | ORAL | Status: AC
  Administered 2022-03-26: 5 mg via ORAL
  Filled 2022-03-26: qty 1

## 2022-03-26 MED ORDER — ACETAMINOPHEN 10 MG/ML IV SOLN
INTRAVENOUS | Status: AC
  Administered 2022-03-26: 1000 mg via INTRAVENOUS
  Filled 2022-03-26: qty 100

## 2022-03-26 MED ORDER — OXYCODONE HCL 5 MG PO TABS: 5.0000 mg | ORAL_TABLET | ORAL | 0 refills | Status: DC | PRN

## 2022-03-26 MED ORDER — TRAMADOL HCL 50 MG PO TABS: 50.0000 mg | ORAL_TABLET | ORAL | 0 refills | Status: DC | PRN

## 2022-03-26 MED ORDER — TRAMADOL HCL 50 MG PO TABS
ORAL_TABLET | ORAL | Status: AC
  Filled 2022-03-26: qty 2

## 2022-03-26 MED ORDER — CELECOXIB 200 MG PO CAPS: 200.0000 mg | ORAL_CAPSULE | Freq: Two times a day (BID) | ORAL | 1 refills | Status: DC

## 2022-03-26 MED ORDER — ALUM & MAG HYDROXIDE-SIMETH 200-200-20 MG/5ML PO SUSP
ORAL | Status: AC
  Filled 2022-03-26: qty 30

## 2022-03-26 NOTE — Progress Notes (Signed)
Physical Therapy Treatment Patient Details Name: Erin Stuart MRN: [id number] DOB: 08/26/1965 Today's Date: 03/26/2022   History of Present Illness Erin Stuart is a 9yoF who comes to [facility] for elective TKA with Dr. Marry Guan.    PT Comments    Pt in recliner on entry, pain at goal, meal completed. Pt agreeable to session, anxious to DC prior to inclement weather this afternoon. Reviewed HEP in full, needs assist only for heel slides. Reviewed knee precautious and use of polar care. Pt able to AMB full distance to stairs and back without sit break. Pt performs 4 stairs without difficulty, safe use of single rail. Pt assisted to BR and back to recliner. Pt pleased with performance, would like to pass on 2nd PT session and focus on DC earlier once she has been evaluated by OT and received her RW.    Recommendations for follow up therapy are one component of a multi-disciplinary discharge planning process, led by the attending physician.  Recommendations may be updated based on patient status, additional functional criteria and insurance authorization.  Follow Up Recommendations  Follow physician's recommendations for discharge plan and follow up therapies     Assistance Recommended at Discharge Set up Supervision/Assistance  Patient can return home with the following Assist for transportation;Assistance with cooking/housework;Help with stairs or ramp for entrance   Equipment Recommendations  Rolling walker (2 wheels)    Recommendations for Other Services       Precautions / Restrictions Precautions Precautions: Fall Restrictions RLE Weight Bearing: Weight bearing as tolerated     Mobility  Bed Mobility Overal bed mobility: Modified Independent                  Transfers Overall transfer level: Needs assistance Equipment used: Rolling walker (2 wheels) Transfers: Sit to/from Stand             General transfer comment: from recliner, from toilet     Ambulation/Gait Ambulation/Gait assistance: Supervision Gait Distance (Feet): 230 Feet Assistive device: Rolling walker (2 wheels) Gait Pattern/deviations: Step-through pattern, WFL(Within Functional Limits) Gait velocity: 0.27ms (far above what is typical for POD1)         Stairs Stairs: Yes Stairs assistance: Supervision Stair Management: One rail Left, One rail Right, Step to pattern Number of Stairs: 4     Wheelchair Mobility    Modified Rankin (Stroke Patients Only)       Balance                                            Cognition Arousal/Alertness: Awake/alert Behavior During Therapy: WFL for tasks assessed/performed Overall Cognitive Status: Within Functional Limits for tasks assessed                                          Exercises Total Joint Exercises Ankle Circles/Pumps: AROM, Both, 5 reps Quad Sets: Limitations Quad Sets Limitations: unable to perform bilaterally, skipped Short Arc Quad: AROM, Right, 15 reps, Supine, Limitations Short Arc Quad Limitations: excellent performance Heel Slides: AAROM, Right, 15 reps, Supine, Limitations Heel Slides Limitations: with twin sheet foot loop, self assist Hip ABduction/ADduction: AROM, Right, 10 reps Straight Leg Raises: AROM, Right, 5 reps, Limitations Straight Leg Raises Limitations: mysteriously robust Goniometric ROM: 8-71 degrees Rt knee flexion ROM  General Comments        Pertinent Vitals/Pain Pain Assessment Pain Assessment: 0-10 Pain Score: 6  Pain Location: operative knee, worse with end range flexion Pain Intervention(s): Limited activity within patient's tolerance, Monitored during session, Premedicated before session, Repositioned    Home Living                          Prior Function            PT Goals (current goals can now be found in the care plan section) Acute Rehab PT Goals Patient Stated Goal: walk normal PT Goal  Formulation: With patient Time For Goal Achievement: 04/08/22 Potential to Achieve Goals: Good Progress towards PT goals: Goals met/education completed, patient discharged from PT    Frequency    BID      PT Plan Current plan remains appropriate    Co-evaluation              AM-PAC PT "6 Clicks" Mobility   Outcome Measure  Help needed turning from your back to your side while in a flat bed without using bedrails?: None Help needed moving from lying on your back to sitting on the side of a flat bed without using bedrails?: None Help needed moving to and from a bed to a chair (including a wheelchair)?: None Help needed standing up from a chair using your arms (e.g., wheelchair or bedside chair)?: None Help needed to walk in hospital room?: A Little Help needed climbing 3-5 steps with a railing? : A Little 6 Click Score: 22    End of Session Equipment Utilized During Treatment: Gait belt Activity Tolerance: Patient tolerated treatment well;No increased pain Patient left: in chair;with family/visitor present;with call bell/phone within reach Nurse Communication: Mobility status PT Visit Diagnosis: Other abnormalities of gait and mobility (R26.89)     Time: 2993-7169 PT Time Calculation (min) (ACUTE ONLY): 33 min  Charges:  $Gait Training: 8-22 mins $Therapeutic Exercise: 8-22 mins                    10:04 AM, 03/26/22 Etta Grandchild, PT, DPT Physical Therapist - [facility]  [phone] ([place])    Acacia Villas C 03/26/2022, 10:02 AM

## 2022-03-26 NOTE — Discharge Summary (Signed)
Physician Discharge Summary  Subjective: 1 Day Post-Op Procedure(s) (LRB): COMPUTER ASSISTED TOTAL KNEE ARTHROPLASTY - RNFA (Right) Patient reports pain as mild.   Patient seen in rounds with Dr. Marry Guan. Patient is well, and has had no acute complaints or problems Patient is ready to go home with home health physical therapy.  Physician Discharge Summary  Patient ID: Erin Stuart MRN: [id number] DOB/AGE: May 19, 1965 57 y.o.  Admit date: 03/25/2022 Discharge date: 03/26/2022  Admission Diagnoses:  Discharge Diagnoses:  Principal Problem:   Total knee replacement status   Discharged Condition: good  Hospital Course: The patient is postop day 1 from a right total knee arthroplasty.  She is doing well with pain management.  Her vitals have remained stable.  She will be doing physical therapy this morning and then will be discharged home with home health physical therapy.  Treatments: surgery:   Right total knee arthroplasty using computer-assisted navigation   SURGEON:  Marciano Sequin. M.D.   ANESTHESIA: spinal   ESTIMATED BLOOD LOSS: 50 mL   FLUIDS REPLACED: 800 mL of crystalloid   TOURNIQUET TIME: 78 minutes   DRAINS: 2 medium Hemovac drains   SOFT TISSUE RELEASES: Anterior cruciate ligament, posterior cruciate ligament, deep medial collateral ligament, patellofemoral ligament   IMPLANTS UTILIZED: DePuy Attune size 5N posterior stabilized femoral component (cemented), size 4 rotating platform tibial component (cemented), 35 mm medialized dome patella (cemented), and a 7 mm stabilized rotating platform polyethylene insert.  Discharge Exam: Blood pressure 108/63, pulse 63, temperature 98.2 F (36.8 C), temperature source Temporal, resp. rate 16, height '5\' 4"'$  (1.626 m), weight 92.5 kg, last menstrual period 05/27/2016, SpO2 96 %.   Disposition: Discharge disposition: 01-Home or Self Care        Allergies as of 03/26/2022       Reactions   Sulfa Antibiotics  Other (See Comments)   Pt states it felt like she had the flu.   Bactrim [sulfamethoxazole-trimethoprim] Nausea And Vomiting        Medication List     STOP taking these medications    ibuprofen 200 MG tablet Commonly known as: ADVIL       TAKE these medications    Alive Womens 50+ Tabs Take 1 tablet by mouth daily.   ALPRAZolam 1 MG tablet Commonly known as: Xanax Take 1 tablet (1 mg total) by mouth 3 (three) times daily as needed for anxiety.   aspirin 81 MG tablet Take 81 mg by mouth every evening. Reported on 03/02/2015   buPROPion 150 MG 24 hr tablet Commonly known as: WELLBUTRIN XL Take 150 mg by mouth daily.   carvedilol 6.25 MG tablet Commonly known as: COREG Take 6.25 mg by mouth 2 (two) times daily.   celecoxib 200 MG capsule Commonly known as: CELEBREX Take 1 capsule (200 mg total) by mouth 2 (two) times daily.   Coricidin D Cold/Flu/Sinus 2-5-325 MG Tabs Generic drug: Chlorphen-Phenyleph-APAP Take 2 tablets by mouth every 6 (six) hours as needed (sinus).   enoxaparin 40 MG/0.4ML injection Commonly known as: LOVENOX Inject 0.4 mLs (40 mg total) into the skin daily for 14 days.   furosemide 20 MG tablet Commonly known as: LASIX Take 20 mg by mouth daily.   losartan 25 MG tablet Commonly known as: COZAAR Take 25 mg by mouth daily.   Mucinex Sinus-Max Sinus/Allrgy 0.05 % nasal spray Generic drug: oxymetazoline Place 2 sprays into both nostrils 2 (two) times daily.   oxyCODONE 5 MG immediate release tablet Commonly known  as: Oxy IR/ROXICODONE Take 1 tablet (5 mg total) by mouth every 4 (four) hours as needed for moderate pain (pain score 4-6).   rosuvastatin 5 MG tablet Commonly known as: CRESTOR Take 5 mg by mouth at bedtime.   spironolactone 25 MG tablet Commonly known as: ALDACTONE Take 25 mg by mouth daily.   traMADol 50 MG tablet Commonly known as: ULTRAM Take 1-2 tablets (50-100 mg total) by mouth every 4 (four) hours as needed  for moderate pain.   Vitamin C 500 MG Chew Chew 1 tablet by mouth daily.   Vitamin D3 125 MCG (5000 UT) Caps Take by mouth 2 (two) times a week.   zinc gluconate 50 MG tablet Take 50 mg by mouth daily.               Durable Medical Equipment  (From admission, onward)           Start     Ordered   03/25/22 1225  DME Walker rolling  Once       Question:  Patient needs a walker to treat with the following condition  Answer:  Total knee replacement status   03/25/22 1224   03/25/22 1225  DME Bedside commode  Once       Comments: Patient is not able to walk the distance required to go the bathroom, or he/she is unable to safely negotiate stairs required to access the bathroom.  A 3in1 BSC will alleviate this problem  Question:  Patient needs a bedside commode to treat with the following condition  Answer:  Total knee replacement status   03/25/22 1224            Follow-up Information     Watt Climes, PA Follow up on 04/08/2022.   Specialty: Physician Assistant Why: at 9:45am Contact information: [place] [address] [phone]         Dereck Leep, MD Follow up on 05/07/2022.   Specialty: Orthopedic Surgery Why: at 1:45pm Contact information: [place] [address] [phone]                 Signed: Prescott Parma, Jairon Ripberger 03/26/2022, 7:41 AM   Objective: Vital signs in last 24 hours: Temp:  [97.5 F (36.4 C)-99.3 F (37.4 C)] 98.2 F (36.8 C) (01/09 0532) Pulse Rate:  [59-74] 63 (01/09 0532) Resp:  [10-20] 16 (01/09 0532) BP: (86-114)/(52-71) 108/63 (01/09 0532) SpO2:  [94 %-100 %] 96 % (01/09 0532)  Intake/Output from previous day:  Intake/Output Summary (Last 24 hours) at 03/26/2022 0741 Last data filed at 03/26/2022 0525 Gross per 24 hour  Intake 696.67 ml  Output 1710 ml  Net -1013.33 ml    Intake/Output this shift: No intake/output data recorded.  Labs: Recent Labs     03/25/22 0633  HGB 13.9   Recent Labs    03/25/22 0633  HCT 41.0   Recent Labs    03/25/22 0633  NA 140  K 3.9  CL 105  BUN 23*  CREATININE 1.10*  GLUCOSE 126*   No results for input(s): "LABPT", "INR" in the last 72 hours.  EXAM: General - Patient is Alert and Oriented Extremity - Neurovascular intact Sensation intact distally Dorsiflexion/Plantar flexion intact Compartment soft Incision - clean, dry, with a Hemovac removed with no complication. Motor Function -plantarflexion and dorsiflexion are intact.  Able to straight leg raise independently.  Assessment/Plan: 1 Day Post-Op Procedure(s) (LRB): COMPUTER ASSISTED TOTAL KNEE  ARTHROPLASTY - RNFA (Right) Procedure(s) (LRB): COMPUTER ASSISTED TOTAL KNEE ARTHROPLASTY - RNFA (Right) Past Medical History:  Diagnosis Date   Acquired dilation of ascending aorta and aortic root ([place]) 05/22/2021   a.) TTE 05/22/2021: Ao root 3.8 cm; asc Ao 3.5 cm   AICD (automatic cardioverter/defibrillator) present    Anginal pain (HCC)    Anxiety    a.) on BZO (alprazolam) PRN   Arthritis    Asthma    Cardiomyopathy ([place])    CHF (congestive heart failure) ([place])    Depression    Dilated cardiomyopathy ([place])    a.) R/LHC 09/02/2016: EF <25%; b.) TTE 08/13/2016: EF <15%; c.) MPI 08/14/2016: EF 21%; d.) cMRI 09/12/2016: EF 22%, no LGE; e.) s/p CRT-D placement 11/27/2016; f.) TTE 01/09/2017: EF 25%; g.) TTE 09/11/2017: EF 25-30%; h.) TTE 10/09/2018: EF 35%; i.) TTE 09/10/2019: EF 45%; j.) TTE 08/04/2020: EF 45%; k.) TTE 05/22/2021: EF 45%   Diverticulosis    History of kidney stones    Hyperlipidemia    Hyperparathyroidism ([place])    LBBB (left bundle branch block) 2012   PONV (postoperative nausea and vomiting)    a.) single remote episode   Pre-diabetes    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   Weight as of this encounter: 92.5 kg.  Diet - Regular diet Follow up - in 2 weeks Activity - WBAT Disposition - Home Condition Upon Discharge - Good DVT Prophylaxis - Lovenox and TED hose  Reche Dixon, PA-C Orthopaedic Surgery 03/26/2022, 7:41 AM

## 2022-03-26 NOTE — TOC Progression Note (Addendum)
Transition of Care [facility]) - Progression Note    Patient Details  Name: Erin Stuart MRN: [id number] Date of Birth: 01/15/1966  Transition of Care [facility]) CM/SW [place], RN Phone Number: 03/26/2022, 8:38 AM  Clinical Narrative:    Has 3 in 1 and standard at home tbut will need a RW [employer] to deliver to the bedside        Expected Discharge Plan and Services         Expected Discharge Date: 03/26/22                                     Social Determinants of Health ([place]) Interventions [place]: No Food Insecurity (03/25/2022)  Housing: Low Risk  (03/25/2022)  Transportation Needs: No Transportation Needs (03/25/2022)  Utilities: Not At Risk (03/25/2022)  Tobacco Use: Low Risk  (03/25/2022)    Readmission Risk Interventions     No data to display

## 2022-03-26 NOTE — Evaluation (Signed)
Occupational Therapy Evaluation Patient Details Name: Erin Stuart MRN: [id number] DOB: 1965-08-09 Today's Date: 03/26/2022   History of Present Illness Erin Stuart is a 71yoF who comes to [facility] for elective TKA with Dr. Marry Guan.   Clinical Impression   Patient seen for OT evaluation, family present. PTA pt was independent for ADLs/IADLs, independent for functional mobility without an AD, and still driving. Pt currently functioning at set up-supervision overall for ADL tasks. Pt/family were educated on polar care system, precautions for RLE WBAT, and no pillow under R knee. Education and demonstration was provided for LB dressing AE including reacher and sock aid with good carryover. Able to complete LB dressing with set up A using reacher. Pt is not far from baseline and anticipate pt to progress quickly once pain managed and in own home environment. No follow up needs indicated at this time. Will sign off.     Recommendations for follow up therapy are one component of a multi-disciplinary discharge planning process, led by the attending physician.  Recommendations may be updated based on patient status, additional functional criteria and insurance authorization.   Follow Up Recommendations  No OT follow up     Assistance Recommended at Discharge Set up Supervision/Assistance  Patient can return home with the following Assistance with cooking/housework;Help with stairs or ramp for entrance;Assist for transportation;A little help with walking and/or transfers;A little help with bathing/dressing/bathroom    Functional Status Assessment  Patient has had a recent decline in their functional status and demonstrates the ability to make significant improvements in function in a reasonable and predictable amount of time.  Equipment Recommendations  Tub/shower bench;Other (comment) Management consultant)    Recommendations for Other Services       Precautions / Restrictions Precautions Precautions:  Fall Restrictions Weight Bearing Restrictions: Yes RLE Weight Bearing: Weight bearing as tolerated      Mobility Bed Mobility               General bed mobility comments: NT, received/left in recliner    Transfers Overall transfer level: Needs assistance Equipment used: Rolling walker (2 wheels) Transfers: Sit to/from Stand Sit to Stand: Supervision           General transfer comment: from recliner      Balance Overall balance assessment: Modified Independent, No apparent balance deficits (not formally assessed)                                         ADL either performed or assessed with clinical judgement   ADL Overall ADL's : Needs assistance/impaired                 Upper Body Dressing : Set up;Sitting Upper Body Dressing Details (indicate cue type and reason): to doff gown and to don sweatshirt Lower Body Dressing: With adaptive equipment;Set up;Sitting/lateral leans;Sit to/from stand;Supervision/safety;Cueing for compensatory techniques Lower Body Dressing Details (indicate cue type and reason): To don underwear, pants, shoes in sitting with use of reacher and set up A. Able to hike in standing with supervision Toilet Transfer: Supervision/safety Toilet Transfer Details (indicate cue type and reason): simulated with STS from [place] and Hygiene: Supervision/safety;Sit to/from stand Toileting - Clothing Manipulation Details (indicate cue type and reason): simulated             Vision Patient Visual Report: No change from baseline  Perception     Praxis      Pertinent Vitals/Pain Pain Assessment Pain Assessment: 0-10 Pain Score: 7  Pain Location: R knee Pain Descriptors / Indicators: Aching, Sore Pain Intervention(s): Limited activity within patient's tolerance, Monitored during session, Ice applied     Hand Dominance     Extremity/Trunk Assessment Upper Extremity  Assessment Upper Extremity Assessment: Overall WFL for tasks assessed   Lower Extremity Assessment Lower Extremity Assessment: RLE deficits/detail RLE Deficits / Details: s/p TKA       Communication Communication Communication: No difficulties   Cognition Arousal/Alertness: Awake/alert Behavior During Therapy: WFL for tasks assessed/performed Overall Cognitive Status: Within Functional Limits for tasks assessed                                       General Comments       Exercises Other Exercises Other Exercises: OT provided education re: role of OT, OT POC, post acute recs, sitting up for all meals, EOB/OOB mobility with assistance, home/fall safety, polar care mgt, no pillow under R knee, RLE WBAT, LB dressing AE, handout provided   Shoulder Instructions      Home Living Family/patient expects to be discharged to:: Private residence Living Arrangements: Spouse/significant other Available Help at Discharge: Family Type of Home: House Home Access: Stairs to enter Technical brewer of Steps: 2 Entrance Stairs-Rails: Left Home Layout: Two level;Able to live on main level with bedroom/bathroom     Bathroom Shower/Tub: Teacher, early years/pre: Standard     Home Equipment: Conservation officer, nature (2 wheels);BSC/3in1;Grab bars - toilet;Grab bars - tub/shower          Prior Functioning/Environment Prior Level of Function : Independent/Modified Independent;Driving                        OT Problem List: Decreased range of motion;Decreased activity tolerance      OT Treatment/Interventions:      OT Goals(Current goals can be found in the care plan section) Acute Rehab OT Goals Patient Stated Goal: return home OT Goal Formulation: All assessment and education complete, DC therapy  OT Frequency:      Co-evaluation              AM-PAC OT "6 Clicks" Daily Activity     Outcome Measure Help from another person eating meals?:  None Help from another person taking care of personal grooming?: None Help from another person toileting, which includes using toliet, bedpan, or urinal?: A Little Help from another person bathing (including washing, rinsing, drying)?: A Little Help from another person to put on and taking off regular upper body clothing?: None Help from another person to put on and taking off regular lower body clothing?: A Little 6 Click Score: 21   End of Session Equipment Utilized During Treatment: Gait belt;Rolling walker (2 wheels) Nurse Communication: Mobility status  Activity Tolerance: Patient tolerated treatment well Patient left: in chair;with call bell/phone within reach;with family/visitor present  OT Visit Diagnosis: Other abnormalities of gait and mobility (R26.89);Pain Pain - Right/Left: Right Pain - part of body: Knee                Time: 1046-1101 OT Time Calculation (min): 15 min Charges:  OT General Charges $OT Visit: 1 Visit OT Evaluation $OT Eval Moderate Complexity: 1 Mod  [facility] MS, OTR/L ascom [phone]  03/26/22, 1:59  PM

## 2022-03-26 NOTE — Progress Notes (Signed)
Subjective: 1 Day Post-Op Procedure(s) (LRB): COMPUTER ASSISTED TOTAL KNEE ARTHROPLASTY - RNFA (Right) Patient reports pain as mild.   Patient is well, and has had no acute complaints or problems Plan is to go Home after hospital stay. Negative for chest pain and shortness of breath Fever: no Gastrointestinal: Negative for nausea and vomiting  Objective: Vital signs in last 24 hours: Temp:  [97.5 F (36.4 C)-99.3 F (37.4 C)] 98.2 F (36.8 C) (01/09 0532) Pulse Rate:  [59-74] 63 (01/09 0532) Resp:  [10-20] 16 (01/09 0532) BP: (86-114)/(52-71) 108/63 (01/09 0532) SpO2:  [94 %-100 %] 96 % (01/09 0532)  Intake/Output from previous day:  Intake/Output Summary (Last 24 hours) at 03/26/2022 0738 Last data filed at 03/26/2022 0525 Gross per 24 hour  Intake 696.67 ml  Output 1710 ml  Net -1013.33 ml    Intake/Output this shift: No intake/output data recorded.  Labs: Recent Labs    03/25/22 0633  HGB 13.9   Recent Labs    03/25/22 0633  HCT 41.0   Recent Labs    03/25/22 0633  NA 140  K 3.9  CL 105  BUN 23*  CREATININE 1.10*  GLUCOSE 126*   No results for input(s): "LABPT", "INR" in the last 72 hours.   EXAM General - Patient is Alert and Oriented Extremity - Neurovascular intact Sensation intact distally Dorsiflexion/Plantar flexion intact Compartment soft Dressing/Incision - clean, dry, with a Hemovac removed with no complication.  The Hemovac tubing was intact on removal. Motor Function - intact, moving foot and toes well on exam.  Able to do a straight leg raise independently.  Past Medical History:  Diagnosis Date   Acquired dilation of ascending aorta and aortic root ([place]) 05/22/2021   a.) TTE 05/22/2021: Ao root 3.8 cm; asc Ao 3.5 cm   AICD (automatic cardioverter/defibrillator) present    Anginal pain ([facility])    Anxiety    a.) on BZO (alprazolam) PRN   Arthritis    Asthma    Cardiomyopathy ([place])    CHF (congestive heart failure) ([place])     Depression    Dilated cardiomyopathy ([place])    a.) R/LHC 09/02/2016: EF <25%; b.) TTE 08/13/2016: EF <15%; c.) MPI 08/14/2016: EF 21%; d.) cMRI 09/12/2016: EF 22%, no LGE; e.) s/p CRT-D placement 11/27/2016; f.) TTE 01/09/2017: EF 25%; g.) TTE 09/11/2017: EF 25-30%; h.) TTE 10/09/2018: EF 35%; i.) TTE 09/10/2019: EF 45%; j.) TTE 08/04/2020: EF 45%; k.) TTE 05/22/2021: EF 45%   Diverticulosis    History of kidney stones    Hyperlipidemia    Hyperparathyroidism ([place])    LBBB (left bundle branch block) 2012   PONV (postoperative nausea and vomiting)    a.) single remote episode   Pre-diabetes    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   Weight as of this encounter: 92.5 kg. Advance diet Up with therapy D/C IV fluids Discharge home with home health  DVT Prophylaxis - Lovenox, Foot Pumps, and TED hose Weight-Bearing as  tolerated to right leg  Reche Dixon, PA-C Orthopaedic Surgery 03/26/2022, 7:38 AM

## 2022-03-26 NOTE — Anesthesia Postprocedure Evaluation (Signed)
Anesthesia Post Note  Patient: Erin Stuart  Procedure(s) Performed: COMPUTER ASSISTED TOTAL KNEE ARTHROPLASTY - RNFA (Right: Knee)  Patient location during evaluation: Nursing Unit Anesthesia Type: Spinal Level of consciousness: oriented and awake and alert Pain management: pain level controlled Vital Signs Assessment: post-procedure vital signs reviewed and stable Respiratory status: spontaneous breathing and respiratory function stable Cardiovascular status: blood pressure returned to baseline and stable Postop Assessment: no headache, no backache, no apparent nausea or vomiting and patient able to bend at knees Anesthetic complications: no  No notable events documented.   Last Vitals:  Vitals:   03/25/22 2300 03/26/22 0532  BP: 101/62 108/63  Pulse: 68 63  Resp: 16 16  Temp: 36.4 C 36.8 C  SpO2: 97% 96%    Last Pain:  Vitals:   03/26/22 0532  TempSrc: Temporal  PainSc:                  Jerrye Noble

## 2022-03-27 DIAGNOSIS — I447 Left bundle-branch block, unspecified: Secondary | ICD-10-CM | POA: Diagnosis not present

## 2022-03-27 DIAGNOSIS — J45909 Unspecified asthma, uncomplicated: Secondary | ICD-10-CM | POA: Diagnosis not present

## 2022-03-27 DIAGNOSIS — Z471 Aftercare following joint replacement surgery: Secondary | ICD-10-CM | POA: Diagnosis not present

## 2022-03-27 DIAGNOSIS — E213 Hyperparathyroidism, unspecified: Secondary | ICD-10-CM | POA: Diagnosis not present

## 2022-03-27 DIAGNOSIS — E7800 Pure hypercholesterolemia, unspecified: Secondary | ICD-10-CM | POA: Diagnosis not present

## 2022-03-27 DIAGNOSIS — I272 Pulmonary hypertension, unspecified: Secondary | ICD-10-CM | POA: Diagnosis not present

## 2022-03-27 DIAGNOSIS — R69 Illness, unspecified: Secondary | ICD-10-CM | POA: Diagnosis not present

## 2022-03-27 DIAGNOSIS — K5790 Diverticulosis of intestine, part unspecified, without perforation or abscess without bleeding: Secondary | ICD-10-CM | POA: Diagnosis not present

## 2022-03-27 DIAGNOSIS — I5020 Unspecified systolic (congestive) heart failure: Secondary | ICD-10-CM | POA: Diagnosis not present

## 2022-03-27 DIAGNOSIS — E559 Vitamin D deficiency, unspecified: Secondary | ICD-10-CM | POA: Diagnosis not present

## 2022-03-27 DIAGNOSIS — M1990 Unspecified osteoarthritis, unspecified site: Secondary | ICD-10-CM | POA: Diagnosis not present

## 2022-03-27 DIAGNOSIS — I420 Dilated cardiomyopathy: Secondary | ICD-10-CM | POA: Diagnosis not present

## 2022-03-30 DIAGNOSIS — M1990 Unspecified osteoarthritis, unspecified site: Secondary | ICD-10-CM | POA: Diagnosis not present

## 2022-03-30 DIAGNOSIS — I447 Left bundle-branch block, unspecified: Secondary | ICD-10-CM | POA: Diagnosis not present

## 2022-03-30 DIAGNOSIS — I420 Dilated cardiomyopathy: Secondary | ICD-10-CM | POA: Diagnosis not present

## 2022-03-30 DIAGNOSIS — K5790 Diverticulosis of intestine, part unspecified, without perforation or abscess without bleeding: Secondary | ICD-10-CM | POA: Diagnosis not present

## 2022-03-30 DIAGNOSIS — I5020 Unspecified systolic (congestive) heart failure: Secondary | ICD-10-CM | POA: Diagnosis not present

## 2022-03-30 DIAGNOSIS — E213 Hyperparathyroidism, unspecified: Secondary | ICD-10-CM | POA: Diagnosis not present

## 2022-03-30 DIAGNOSIS — E559 Vitamin D deficiency, unspecified: Secondary | ICD-10-CM | POA: Diagnosis not present

## 2022-03-30 DIAGNOSIS — Z471 Aftercare following joint replacement surgery: Secondary | ICD-10-CM | POA: Diagnosis not present

## 2022-03-30 DIAGNOSIS — I272 Pulmonary hypertension, unspecified: Secondary | ICD-10-CM | POA: Diagnosis not present

## 2022-03-30 DIAGNOSIS — E7800 Pure hypercholesterolemia, unspecified: Secondary | ICD-10-CM | POA: Diagnosis not present

## 2022-03-30 DIAGNOSIS — R69 Illness, unspecified: Secondary | ICD-10-CM | POA: Diagnosis not present

## 2022-03-30 DIAGNOSIS — J45909 Unspecified asthma, uncomplicated: Secondary | ICD-10-CM | POA: Diagnosis not present

## 2022-03-31 DIAGNOSIS — I420 Dilated cardiomyopathy: Secondary | ICD-10-CM | POA: Diagnosis not present

## 2022-03-31 DIAGNOSIS — K5790 Diverticulosis of intestine, part unspecified, without perforation or abscess without bleeding: Secondary | ICD-10-CM | POA: Diagnosis not present

## 2022-03-31 DIAGNOSIS — R69 Illness, unspecified: Secondary | ICD-10-CM | POA: Diagnosis not present

## 2022-03-31 DIAGNOSIS — I5020 Unspecified systolic (congestive) heart failure: Secondary | ICD-10-CM | POA: Diagnosis not present

## 2022-03-31 DIAGNOSIS — Z471 Aftercare following joint replacement surgery: Secondary | ICD-10-CM | POA: Diagnosis not present

## 2022-03-31 DIAGNOSIS — E7800 Pure hypercholesterolemia, unspecified: Secondary | ICD-10-CM | POA: Diagnosis not present

## 2022-03-31 DIAGNOSIS — M1990 Unspecified osteoarthritis, unspecified site: Secondary | ICD-10-CM | POA: Diagnosis not present

## 2022-03-31 DIAGNOSIS — E213 Hyperparathyroidism, unspecified: Secondary | ICD-10-CM | POA: Diagnosis not present

## 2022-03-31 DIAGNOSIS — E559 Vitamin D deficiency, unspecified: Secondary | ICD-10-CM | POA: Diagnosis not present

## 2022-03-31 DIAGNOSIS — I272 Pulmonary hypertension, unspecified: Secondary | ICD-10-CM | POA: Diagnosis not present

## 2022-03-31 DIAGNOSIS — J45909 Unspecified asthma, uncomplicated: Secondary | ICD-10-CM | POA: Diagnosis not present

## 2022-03-31 DIAGNOSIS — I447 Left bundle-branch block, unspecified: Secondary | ICD-10-CM | POA: Diagnosis not present

## 2022-04-02 DIAGNOSIS — M1990 Unspecified osteoarthritis, unspecified site: Secondary | ICD-10-CM | POA: Diagnosis not present

## 2022-04-02 DIAGNOSIS — I5020 Unspecified systolic (congestive) heart failure: Secondary | ICD-10-CM | POA: Diagnosis not present

## 2022-04-02 DIAGNOSIS — R69 Illness, unspecified: Secondary | ICD-10-CM | POA: Diagnosis not present

## 2022-04-02 DIAGNOSIS — I272 Pulmonary hypertension, unspecified: Secondary | ICD-10-CM | POA: Diagnosis not present

## 2022-04-02 DIAGNOSIS — K5790 Diverticulosis of intestine, part unspecified, without perforation or abscess without bleeding: Secondary | ICD-10-CM | POA: Diagnosis not present

## 2022-04-02 DIAGNOSIS — J45909 Unspecified asthma, uncomplicated: Secondary | ICD-10-CM | POA: Diagnosis not present

## 2022-04-02 DIAGNOSIS — E559 Vitamin D deficiency, unspecified: Secondary | ICD-10-CM | POA: Diagnosis not present

## 2022-04-02 DIAGNOSIS — Z471 Aftercare following joint replacement surgery: Secondary | ICD-10-CM | POA: Diagnosis not present

## 2022-04-02 DIAGNOSIS — E213 Hyperparathyroidism, unspecified: Secondary | ICD-10-CM | POA: Diagnosis not present

## 2022-04-02 DIAGNOSIS — E7800 Pure hypercholesterolemia, unspecified: Secondary | ICD-10-CM | POA: Diagnosis not present

## 2022-04-02 DIAGNOSIS — I447 Left bundle-branch block, unspecified: Secondary | ICD-10-CM | POA: Diagnosis not present

## 2022-04-02 DIAGNOSIS — I420 Dilated cardiomyopathy: Secondary | ICD-10-CM | POA: Diagnosis not present

## 2022-04-04 DIAGNOSIS — K5790 Diverticulosis of intestine, part unspecified, without perforation or abscess without bleeding: Secondary | ICD-10-CM | POA: Diagnosis not present

## 2022-04-04 DIAGNOSIS — E213 Hyperparathyroidism, unspecified: Secondary | ICD-10-CM | POA: Diagnosis not present

## 2022-04-04 DIAGNOSIS — E7800 Pure hypercholesterolemia, unspecified: Secondary | ICD-10-CM | POA: Diagnosis not present

## 2022-04-04 DIAGNOSIS — R69 Illness, unspecified: Secondary | ICD-10-CM | POA: Diagnosis not present

## 2022-04-04 DIAGNOSIS — I447 Left bundle-branch block, unspecified: Secondary | ICD-10-CM | POA: Diagnosis not present

## 2022-04-04 DIAGNOSIS — J45909 Unspecified asthma, uncomplicated: Secondary | ICD-10-CM | POA: Diagnosis not present

## 2022-04-04 DIAGNOSIS — Z471 Aftercare following joint replacement surgery: Secondary | ICD-10-CM | POA: Diagnosis not present

## 2022-04-04 DIAGNOSIS — E559 Vitamin D deficiency, unspecified: Secondary | ICD-10-CM | POA: Diagnosis not present

## 2022-04-04 DIAGNOSIS — M1990 Unspecified osteoarthritis, unspecified site: Secondary | ICD-10-CM | POA: Diagnosis not present

## 2022-04-04 DIAGNOSIS — I420 Dilated cardiomyopathy: Secondary | ICD-10-CM | POA: Diagnosis not present

## 2022-04-04 DIAGNOSIS — I272 Pulmonary hypertension, unspecified: Secondary | ICD-10-CM | POA: Diagnosis not present

## 2022-04-04 DIAGNOSIS — I5020 Unspecified systolic (congestive) heart failure: Secondary | ICD-10-CM | POA: Diagnosis not present

## 2022-04-05 DIAGNOSIS — I272 Pulmonary hypertension, unspecified: Secondary | ICD-10-CM | POA: Diagnosis not present

## 2022-04-05 DIAGNOSIS — I447 Left bundle-branch block, unspecified: Secondary | ICD-10-CM | POA: Diagnosis not present

## 2022-04-05 DIAGNOSIS — R69 Illness, unspecified: Secondary | ICD-10-CM | POA: Diagnosis not present

## 2022-04-05 DIAGNOSIS — I5020 Unspecified systolic (congestive) heart failure: Secondary | ICD-10-CM | POA: Diagnosis not present

## 2022-04-05 DIAGNOSIS — M1990 Unspecified osteoarthritis, unspecified site: Secondary | ICD-10-CM | POA: Diagnosis not present

## 2022-04-05 DIAGNOSIS — K5790 Diverticulosis of intestine, part unspecified, without perforation or abscess without bleeding: Secondary | ICD-10-CM | POA: Diagnosis not present

## 2022-04-05 DIAGNOSIS — I420 Dilated cardiomyopathy: Secondary | ICD-10-CM | POA: Diagnosis not present

## 2022-04-05 DIAGNOSIS — Z471 Aftercare following joint replacement surgery: Secondary | ICD-10-CM | POA: Diagnosis not present

## 2022-04-05 DIAGNOSIS — E7800 Pure hypercholesterolemia, unspecified: Secondary | ICD-10-CM | POA: Diagnosis not present

## 2022-04-05 DIAGNOSIS — E559 Vitamin D deficiency, unspecified: Secondary | ICD-10-CM | POA: Diagnosis not present

## 2022-04-05 DIAGNOSIS — J45909 Unspecified asthma, uncomplicated: Secondary | ICD-10-CM | POA: Diagnosis not present

## 2022-04-05 DIAGNOSIS — E213 Hyperparathyroidism, unspecified: Secondary | ICD-10-CM | POA: Diagnosis not present

## 2022-04-07 DIAGNOSIS — E213 Hyperparathyroidism, unspecified: Secondary | ICD-10-CM | POA: Diagnosis not present

## 2022-04-07 DIAGNOSIS — K5790 Diverticulosis of intestine, part unspecified, without perforation or abscess without bleeding: Secondary | ICD-10-CM | POA: Diagnosis not present

## 2022-04-07 DIAGNOSIS — I420 Dilated cardiomyopathy: Secondary | ICD-10-CM | POA: Diagnosis not present

## 2022-04-07 DIAGNOSIS — E7800 Pure hypercholesterolemia, unspecified: Secondary | ICD-10-CM | POA: Diagnosis not present

## 2022-04-07 DIAGNOSIS — R69 Illness, unspecified: Secondary | ICD-10-CM | POA: Diagnosis not present

## 2022-04-07 DIAGNOSIS — I272 Pulmonary hypertension, unspecified: Secondary | ICD-10-CM | POA: Diagnosis not present

## 2022-04-07 DIAGNOSIS — J45909 Unspecified asthma, uncomplicated: Secondary | ICD-10-CM | POA: Diagnosis not present

## 2022-04-07 DIAGNOSIS — I5020 Unspecified systolic (congestive) heart failure: Secondary | ICD-10-CM | POA: Diagnosis not present

## 2022-04-07 DIAGNOSIS — Z471 Aftercare following joint replacement surgery: Secondary | ICD-10-CM | POA: Diagnosis not present

## 2022-04-07 DIAGNOSIS — M1990 Unspecified osteoarthritis, unspecified site: Secondary | ICD-10-CM | POA: Diagnosis not present

## 2022-04-07 DIAGNOSIS — I447 Left bundle-branch block, unspecified: Secondary | ICD-10-CM | POA: Diagnosis not present

## 2022-04-07 DIAGNOSIS — E559 Vitamin D deficiency, unspecified: Secondary | ICD-10-CM | POA: Diagnosis not present

## 2022-04-08 DIAGNOSIS — Z96651 Presence of right artificial knee joint: Secondary | ICD-10-CM | POA: Diagnosis not present

## 2022-04-10 ENCOUNTER — Encounter: Payer: Self-pay | Admitting: Physician Assistant

## 2022-04-10 ENCOUNTER — Other Ambulatory Visit: Payer: Self-pay | Admitting: Internal Medicine

## 2022-04-10 DIAGNOSIS — Z1231 Encounter for screening mammogram for malignant neoplasm of breast: Secondary | ICD-10-CM

## 2022-04-11 DIAGNOSIS — Z96651 Presence of right artificial knee joint: Secondary | ICD-10-CM | POA: Diagnosis not present

## 2022-04-14 ENCOUNTER — Other Ambulatory Visit: Payer: Self-pay | Admitting: Urology

## 2022-04-17 DIAGNOSIS — Z96651 Presence of right artificial knee joint: Secondary | ICD-10-CM | POA: Diagnosis not present

## 2022-04-19 DIAGNOSIS — Z96651 Presence of right artificial knee joint: Secondary | ICD-10-CM | POA: Diagnosis not present

## 2022-04-19 DIAGNOSIS — Z471 Aftercare following joint replacement surgery: Secondary | ICD-10-CM | POA: Diagnosis not present

## 2022-04-19 DIAGNOSIS — M25561 Pain in right knee: Secondary | ICD-10-CM | POA: Diagnosis not present

## 2022-04-23 DIAGNOSIS — Z96651 Presence of right artificial knee joint: Secondary | ICD-10-CM | POA: Diagnosis not present

## 2022-04-23 DIAGNOSIS — M25561 Pain in right knee: Secondary | ICD-10-CM | POA: Diagnosis not present

## 2022-04-25 DIAGNOSIS — M25561 Pain in right knee: Secondary | ICD-10-CM | POA: Diagnosis not present

## 2022-04-25 DIAGNOSIS — Z96651 Presence of right artificial knee joint: Secondary | ICD-10-CM | POA: Diagnosis not present

## 2022-04-29 DIAGNOSIS — Z96651 Presence of right artificial knee joint: Secondary | ICD-10-CM | POA: Diagnosis not present

## 2022-05-03 DIAGNOSIS — Z96651 Presence of right artificial knee joint: Secondary | ICD-10-CM | POA: Diagnosis not present

## 2022-05-07 ENCOUNTER — Ambulatory Visit
Admission: RE | Admit: 2022-05-07 | Discharge: 2022-05-07 | Disposition: A | Discharge status: Home / Self Care | Payer: 59 | Source: Ambulatory Visit | Attending: Internal Medicine | Admitting: Internal Medicine

## 2022-05-07 DIAGNOSIS — Z1231 Encounter for screening mammogram for malignant neoplasm of breast: Secondary | ICD-10-CM

## 2022-05-09 DIAGNOSIS — M25561 Pain in right knee: Secondary | ICD-10-CM | POA: Diagnosis not present

## 2022-05-09 DIAGNOSIS — Z96651 Presence of right artificial knee joint: Secondary | ICD-10-CM | POA: Diagnosis not present

## 2022-05-16 DIAGNOSIS — Z96651 Presence of right artificial knee joint: Secondary | ICD-10-CM | POA: Diagnosis not present

## 2022-06-03 DIAGNOSIS — Z87440 Personal history of urinary (tract) infections: Secondary | ICD-10-CM | POA: Diagnosis not present

## 2022-06-03 DIAGNOSIS — R87610 Atypical squamous cells of undetermined significance on cytologic smear of cervix (ASC-US): Secondary | ICD-10-CM | POA: Diagnosis not present

## 2022-06-03 DIAGNOSIS — Z01419 Encounter for gynecological examination (general) (routine) without abnormal findings: Secondary | ICD-10-CM | POA: Diagnosis not present

## 2022-06-11 DIAGNOSIS — E213 Hyperparathyroidism, unspecified: Secondary | ICD-10-CM | POA: Diagnosis not present

## 2022-06-11 DIAGNOSIS — M8588 Other specified disorders of bone density and structure, other site: Secondary | ICD-10-CM | POA: Diagnosis not present

## 2022-06-19 DIAGNOSIS — E213 Hyperparathyroidism, unspecified: Secondary | ICD-10-CM | POA: Diagnosis not present

## 2022-06-19 DIAGNOSIS — M8580 Other specified disorders of bone density and structure, unspecified site: Secondary | ICD-10-CM | POA: Diagnosis not present

## 2022-06-19 DIAGNOSIS — E042 Nontoxic multinodular goiter: Secondary | ICD-10-CM | POA: Diagnosis not present

## 2022-06-20 DIAGNOSIS — I420 Dilated cardiomyopathy: Secondary | ICD-10-CM | POA: Diagnosis not present

## 2022-06-20 DIAGNOSIS — E213 Hyperparathyroidism, unspecified: Secondary | ICD-10-CM | POA: Diagnosis not present

## 2022-06-20 DIAGNOSIS — Z2821 Immunization not carried out because of patient refusal: Secondary | ICD-10-CM | POA: Diagnosis not present

## 2022-06-20 DIAGNOSIS — I5022 Chronic systolic (congestive) heart failure: Secondary | ICD-10-CM | POA: Diagnosis not present

## 2022-06-20 DIAGNOSIS — R7303 Prediabetes: Secondary | ICD-10-CM | POA: Diagnosis not present

## 2022-06-26 DIAGNOSIS — R55 Syncope and collapse: Secondary | ICD-10-CM | POA: Diagnosis not present

## 2022-06-26 DIAGNOSIS — R002 Palpitations: Secondary | ICD-10-CM | POA: Diagnosis not present

## 2022-06-26 DIAGNOSIS — E782 Mixed hyperlipidemia: Secondary | ICD-10-CM | POA: Diagnosis not present

## 2022-06-26 DIAGNOSIS — I5022 Chronic systolic (congestive) heart failure: Secondary | ICD-10-CM | POA: Diagnosis not present

## 2022-06-26 DIAGNOSIS — I447 Left bundle-branch block, unspecified: Secondary | ICD-10-CM | POA: Diagnosis not present

## 2022-06-26 DIAGNOSIS — I420 Dilated cardiomyopathy: Secondary | ICD-10-CM | POA: Diagnosis not present

## 2022-09-16 DIAGNOSIS — D351 Benign neoplasm of parathyroid gland: Secondary | ICD-10-CM

## 2022-09-16 HISTORY — DX: Benign neoplasm of parathyroid gland: D35.1

## 2022-09-16 HISTORY — PX: PARATHYROIDECTOMY: SHX19

## 2023-02-12 ENCOUNTER — Ambulatory Visit: Payer: 59 | Admitting: Urology

## 2023-04-12 NOTE — Discharge Instructions (Addendum)
Instructions after Total Knee Replacement   James P. Angie Fava., M.D.    Dept. of Orthopaedics & Sports Medicine [facility] [address] [place], [place]  [address]  Phone: [phone]   Fax: [phone]       [email or web address]       DIET: Drink plenty of non-alcoholic fluids. Resume your normal diet. Include foods high in fiber.  ACTIVITY:  You may use crutches or a walker with weight-bearing as tolerated, unless instructed otherwise. You may be weaned off of the walker or crutches by your Physical Therapist.  Do NOT place pillows under the knee. Anything placed under the knee could limit your ability to straighten the knee.   Continue doing gentle exercises. Exercising will reduce the pain and swelling, increase motion, and prevent muscle weakness.   Please continue to use the TED compression stockings for 6 weeks. You may remove the stockings at night, but should reapply them in the morning. Do not drive or operate any equipment until instructed.  WOUND CARE:  Continue to use the PolarCare or ice packs periodically to reduce pain and swelling. You may bathe or shower after the staples are removed at the first office visit following surgery.  MEDICATIONS: You may resume your regular medications. Please take the pain medication as prescribed on the medication. Do not take pain medication on an empty stomach. You have been given a prescription for a blood thinner (Lovenox or Coumadin). Please take the medication as instructed. (NOTE: After completing a 2 week course of Lovenox, take one Enteric-coated aspirin once a day. This along with elevation will help reduce the possibility of phlebitis in your operated leg.) Do not drive or drink alcoholic beverages when taking pain medications.  CALL THE OFFICE FOR: Temperature above 101 degrees Excessive bleeding or drainage on the dressing. Excessive swelling, coldness, or paleness of the toes. Persistent nausea and  vomiting.  FOLLOW-UP:  You should have an appointment to return to the office in 10-14 days after surgery. Arrangements have been made for continuation of Physical Therapy (either home therapy or outpatient therapy).     [facility] Department Directory         [email or web address]       [email or web address]          Cardiology  Appointments: [place] - [phone]  Endocrinology  Appointments: [place] [phone] [place] - [phone]  Gastroenterology  Appointments: [place] [phone] [place] - [phone]        General Surgery   Appointments: [facility]  Internal Medicine/Family Medicine  Appointments: [facility] [place] - [phone] [place] - [phone]  Metabolic and Weigh Loss Surgery  Appointments: [facility]        Neurology  Appointments: [place] [phone] [place] - [phone]  Neurosurgery  Appointments: [place]  Obstetrics & Gynecology  Appointments: [place] [phone] [place] - [phone]        Pediatrics  Appointments: [place] [phone] [place] - [phone]  Physiatry  Appointments: [place] [phone]  Physical Therapy  Appointments: [place] - [phone]        Podiatry  Appointments: [place] [phone] [place] - [phone]  Pulmonology  Appointments: [place]  Rheumatology  Appointments: [place] [phone]        [place] Location: [facility]  [address] [place], [place]  [address]  [place] Location: [facility] [address] [place], [place]  [address]  [place] Location: [facility] [address]  [address] [place], [place]  [address]      [employer]  [address] , [place], [place], [address]. [phone] They will call you to arrange when they can come to see you

## 2023-04-15 ENCOUNTER — Encounter
Admission: RE | Admit: 2023-04-15 | Discharge: 2023-04-15 | Disposition: A | Discharge status: Home / Self Care | Payer: 59 | Source: Ambulatory Visit | Attending: Orthopedic Surgery | Admitting: Orthopedic Surgery

## 2023-04-15 ENCOUNTER — Other Ambulatory Visit: Payer: Self-pay

## 2023-04-15 DIAGNOSIS — M1712 Unilateral primary osteoarthritis, left knee: Secondary | ICD-10-CM | POA: Insufficient documentation

## 2023-04-15 DIAGNOSIS — Z01818 Encounter for other preprocedural examination: Secondary | ICD-10-CM | POA: Diagnosis present

## 2023-04-15 DIAGNOSIS — Z01812 Encounter for preprocedural laboratory examination: Secondary | ICD-10-CM

## 2023-04-15 HISTORY — DX: Viral myocarditis: B33.22

## 2023-04-15 HISTORY — DX: Essential (primary) hypertension: I10

## 2023-04-15 HISTORY — DX: Bilateral primary osteoarthritis of knee: M17.0

## 2023-04-15 HISTORY — DX: Pure hypercholesterolemia, unspecified: E78.00

## 2023-04-15 LAB — SURGICAL PCR SCREEN
MRSA, PCR: NEGATIVE
Staphylococcus aureus: NEGATIVE

## 2023-04-15 LAB — C-REACTIVE PROTEIN: CRP: 0.7 mg/dL (ref <1.0)

## 2023-04-15 LAB — SEDIMENTATION RATE: Sed Rate: 10 mm/h (ref 0–30)

## 2023-04-15 NOTE — Patient Instructions (Addendum)
Your procedure is scheduled on: Monday, February 3 Report to the Registration Desk on the 1st floor of the [facility] To find out your arrival time, please call [phone] between 1PM - 3PM on: Friday, January 31 If your arrival time is 6:00 am, do not arrive before that time as the [facility] entrance doors do not open until 6:00 am.  REMEMBER: Instructions that are not followed completely may result in serious medical risk, up to and including death; or upon the discretion of your surgeon and anesthesiologist your surgery may need to be rescheduled.  Do not eat food after midnight the night before surgery.  No gum chewing or hard candies.  You may however, drink CLEAR liquids up to 2 hours before you are scheduled to arrive for your surgery. Do not drink anything within 2 hours of your scheduled arrival time.  Clear liquids include: - water  - apple juice without pulp - gatorade (not RED colors) - black coffee or tea (Do NOT add milk or creamers to the coffee or tea) Do NOT drink anything that is not on this list.  In addition, your doctor has ordered for you to drink the provided:  Ensure Pre-Surgery Clear Carbohydrate Drink  Drinking this carbohydrate drink up to two hours before surgery helps to reduce insulin resistance and improve patient outcomes. Please complete drinking 2 hours before scheduled arrival time.  One week prior to surgery: starting today, January 28 Stop Anti-inflammatories (NSAIDS) such as Advil, Aleve, Ibuprofen, Motrin, Naproxen, Naprosyn and Aspirin based products such as Excedrin, Goody's Powder, BC Powder. Stop ANY OVER THE COUNTER supplements until after surgery. Stop vitamin C, cranberry, vitamin B, vitamin K, magnesium.  Do NOT start taking aspirin again until AFTER surgery per surgeon instruction.  You may however, continue to take Tylenol if needed for pain up until the day of surgery.  Continue taking all of your other prescription  medications up until the day of surgery.  ON THE DAY OF SURGERY ONLY TAKE THESE MEDICATIONS WITH SIPS OF WATER:  ALPRAZolam Prudy Feeler) if needed for anxiety carvedilol (COREG)   No Alcohol for 24 hours before or after surgery.  No Smoking including e-cigarettes for 24 hours before surgery.  No chewable tobacco products for at least 6 hours before surgery.  No nicotine patches on the day of surgery.  Do not use any "recreational" drugs for at least a week (preferably 2 weeks) before your surgery.  Please be advised that the combination of cocaine and anesthesia may have negative outcomes, up to and including death. If you test positive for cocaine, your surgery will be cancelled.  On the morning of surgery brush your teeth with toothpaste and water, you may rinse your mouth with mouthwash if you wish. Do not swallow any toothpaste or mouthwash.  Use CHG Soap as directed on instruction sheet.  Do not wear jewelry, make-up, hairpins, clips or nail polish.  For welded (permanent) jewelry: bracelets, anklets, waist bands, etc.  Please have this removed prior to surgery.  If it is not removed, there is a chance that hospital personnel will need to cut it off on the day of surgery.  Do not wear lotions, powders, or perfumes.   Do not shave body hair from the neck down 48 hours before surgery.  Contact lenses, hearing aids and dentures may not be worn into surgery.  Do not bring valuables to the hospital. [facility] is not responsible for any missing/lost belongings or valuables.  Notify your doctor if there is any change in your medical condition (cold, fever, infection).  Wear comfortable clothing (specific to your surgery type) to the hospital.  After surgery, you can help prevent lung complications by doing breathing exercises.  Take deep breaths and cough every 1-2 hours. Your doctor may order a device called an Incentive Spirometer to help you take deep breaths.  If you are  being admitted to the hospital overnight, leave your suitcase in the car. After surgery it may be brought to your room.  In case of increased patient census, it may be necessary for you, the patient, to continue your postoperative care in the Same Day Surgery department.  If you are being discharged the day of surgery, you will not be allowed to drive home. You will need a responsible individual to drive you home and stay with you for 24 hours after surgery.   If you are taking public transportation, you will need to have a responsible individual with you.  Please call the Pre-admissions Testing Dept. at [phone] if you have any questions about these instructions.  Surgery Visitation Policy:  Patients having surgery or a procedure may have two visitors.  Children under the age of 43 must have an adult with them who is not the patient.  Temporary Visitor Restrictions Due to increasing cases of flu, RSV and COVID-19: Children ages 80 and under will not be able to visit patients in [facility] hospitals under most circumstances.  Inpatient Visitation:    Visiting hours are 7 a.m. to 8 p.m. Up to four visitors are allowed at one time in a patient room. The visitors may rotate out with other people during the day.  One visitor age 35 or older may stay with the patient overnight and must be in the room by 8 p.m.       Pre-operative 5 CHG Bath Instructions   You can play a key role in reducing the risk of infection after surgery. Your skin needs to be as free of germs as possible. You can reduce the number of germs on your skin by washing with CHG (chlorhexidine gluconate) soap before surgery. CHG is an antiseptic soap that kills germs and continues to kill germs even after washing.   DO NOT use if you have an allergy to chlorhexidine/CHG or antibacterial soaps. If your skin becomes reddened or irritated, stop using the CHG and notify one of our RNs at [phone].   Please  shower with the CHG soap starting 4 days before surgery using the following schedule:     Please keep in mind the following:  DO NOT shave, including legs and underarms, starting the day of your first shower.   You may shave your face at any point before/day of surgery.  Place clean sheets on your bed the day you start using CHG soap. Use a clean washcloth (not used since being washed) for each shower. DO NOT sleep with pets once you start using the CHG.   CHG Shower Instructions:  If you choose to wash your hair and private area, wash first with your normal shampoo/soap.  After you use shampoo/soap, rinse your hair and body thoroughly to remove shampoo/soap residue.  Turn the water OFF and apply about 3 tablespoons (45 ml) of CHG soap to a CLEAN washcloth.  Apply CHG soap ONLY FROM YOUR NECK DOWN TO YOUR TOES (washing for 3-5 minutes)  DO NOT use CHG soap on face, private areas, open wounds, or  sores.  Pay special attention to the area where your surgery is being performed.  If you are having back surgery, having someone wash your back for you may be helpful. Wait 2 minutes after CHG soap is applied, then you may rinse off the CHG soap.  Pat dry with a clean towel  Put on clean clothes/pajamas   If you choose to wear lotion, please use ONLY the CHG-compatible lotions on the back of this paper.     Additional instructions for the day of surgery: DO NOT APPLY any lotions, deodorants, cologne, or perfumes.   Put on clean/comfortable clothes.  Brush your teeth.  Ask your nurse before applying any prescription medications to the skin.      CHG Compatible Lotions   Aveeno Moisturizing lotion  Cetaphil Moisturizing Cream  Cetaphil Moisturizing Lotion  Clairol Herbal Essence Moisturizing Lotion, Dry Skin  Clairol Herbal Essence Moisturizing Lotion, Extra Dry Skin  Clairol Herbal Essence Moisturizing Lotion, Normal Skin  Curel Age Defying Therapeutic Moisturizing Lotion with Alpha  Hydroxy  Curel Extreme Care Body Lotion  Curel Soothing Hands Moisturizing Hand Lotion  Curel Therapeutic Moisturizing Cream, Fragrance-Free  Curel Therapeutic Moisturizing Lotion, Fragrance-Free  Curel Therapeutic Moisturizing Lotion, Original Formula  Eucerin Daily Replenishing Lotion  Eucerin Dry Skin Therapy Plus Alpha Hydroxy Crme  Eucerin Dry Skin Therapy Plus Alpha Hydroxy Lotion  Eucerin Original Crme  Eucerin Original Lotion  Eucerin Plus Crme Eucerin Plus Lotion  Eucerin TriLipid Replenishing Lotion  Keri Anti-Bacterial Hand Lotion  Keri Deep Conditioning Original Lotion Dry Skin Formula Softly Scented  Keri Deep Conditioning Original Lotion, Fragrance Free Sensitive Skin Formula  Keri Lotion Fast Absorbing Fragrance Free Sensitive Skin Formula  Keri Lotion Fast Absorbing Softly Scented Dry Skin Formula  Keri Original Lotion  Keri Skin Renewal Lotion Keri Silky Smooth Lotion  Keri Silky Smooth Sensitive Skin Lotion  Nivea Body Creamy Conditioning Oil  Nivea Body Extra Enriched Lotion  Nivea Body Original Lotion  Nivea Body Sheer Moisturizing Lotion Nivea Crme  Nivea Skin Firming Lotion  NutraDerm 30 Skin Lotion  NutraDerm Skin Lotion  NutraDerm Therapeutic Skin Cream  NutraDerm Therapeutic Skin Lotion  ProShield Protective Hand Cream  Provon moisturizing lotion     Preoperative Educational Videos for Total Hip, Knee and Shoulder Replacements  To better prepare for surgery, please view our videos that explain the physical activity and discharge planning required to have the best surgical recovery at [facility].  [email or web address]  Questions? Call [phone] or email [email or web address]

## 2023-04-17 ENCOUNTER — Encounter: Payer: Self-pay | Admitting: Orthopedic Surgery

## 2023-04-17 NOTE — Progress Notes (Signed)
Perioperative / Anesthesia Services  Pre-Admission Testing Clinical Review / Pre-Operative Anesthesia Consult  Date: 04/17/23  Patient Demographics:  Name: Erin Stuart DOB: [birth year] MRN:   [id number]  Planned Surgical Procedure(s):    Case: [id number] Date/Time: 04/21/23 1106   Procedure: COMPUTER ASSISTED TOTAL KNEE ARTHROPLASTY (Left: Knee)   Anesthesia type: Choice   Pre-op diagnosis: Primary osteoarthritis of left knee M17.12   Location: [facility] OR ROOM 01 / [facility]   Surgeons: Donato Heinz, MD      NOTE: Available PAT nursing documentation and vital signs have been reviewed. Clinical nursing staff has updated patient's PMH/PSHx, current medication list, and drug allergies/intolerances to ensure comprehensive history available to assist in medical decision making as it pertains to the aforementioned surgical procedure and anticipated anesthetic course. Extensive review of available clinical information personally performed. [place] PMH and PSHx updated with any diagnoses/procedures that  may have been inadvertently omitted during his intake with the pre-admission testing department's nursing staff.  Clinical Discussion:  Erin Stuart is a 58 y.o. female who is submitted for pre-surgical anesthesia review and clearance prior to her undergoing the above procedure. Pertinent PMH includes: CAD, DCM (s/p CRT-D placement), CHF, LBBB, ascending aortic dilatation, viral myocarditis, palpitations, angina, HTN, HLD, prediabetes, hyperparathyroidism secondary to adenoma (s/p parathyroidectomy), pulmonary hypertension, asthma, OA, anxiety (on BZO), depression.   Patient is followed by cardiology Darrold Junker, MD). She was last seen in the cardiology clinic on 03/03/2023; notes reviewed. At the time of her clinic visit, patient doing well overall from a cardiovascular perspective. Patient with chronic exertional dyspnea that was reported to be stable and at baseline.  She also was experiencing intermittent peripheral edema. Patient denied any chest pain, PND, orthopnea, palpitations, weakness, fatigue, vertiginous symptoms, or presyncope/syncope. Patient with a past medical history significant for cardiovascular diagnoses. Documented physical exam was grossly benign, providing no evidence of acute exacerbation and/or decompensation of the patient's known cardiovascular conditions.  Patient diagnosed with a nonischemic dilated cardiomyopathy in approximately 2018.  TTE performed on 08/13/2016 revealed a severely reduced left ventricular systolic function with an EF of 15%.  There was global hypokinesis with septal and apical akinesis.  Mild LVH noted.  Left atrium moderately enlarged.  Was mild to moderate pan valvular regurgitation.  It was no evidence of a significant transvalvular gradient to suggest stenosis.  Mild pulmonary hypertension noted; RVSP 41.2 mmHg.   Myocardial perfusion imaging study performed on 07/15/2016 revealed a severely reduced left ventricular systolic function with an EF of 21%.  There was global left ventricular hypokinesis.  Left ventricular cavity size was enlarged.  There was a moderate perfusion abnormality in the inferoseptal and inferoapical regions on stress images consistent with possible ischemia.   Cardiac MRI performed on 09/13/2016 revealed a severely reduced left ventricular systolic function with an EF of 22%.  Left ventricle moderately enlarged.  Left atrium severely enlarged and right atrium mildly enlarged.  There was moderate mitral valve regurgitation.  There was no evidence of LGE to suggest infarction, scar, or infiltrative process.   Diagnostic RIGHT/LEFT heart catheterization performed on 08/27/2016 revealing a severely reduced left ventricular systolic function with an EF of 20-25%.  There was global hypokinesis noted.  LVEDP elevated at 23 mmHg.  There was no evidence of aortic or mitral valve stenosis.  Mean RA = 11  mmHg, mean PA pressure = 30 mmHg, mean PCWP = 23 mmHg, PA saturation = 65%, AO saturation = 97%.  Cardiac output  3.09 L/min.  Cardiac index 1.62 L/min/m.   Patient underwent implantation of a Medtronic CRT-D device on 12/03/2016.  Device is regularly interrogated by her primary cardiology team.  Last device interrogation was performed on 01/21/2023, at which time device was noted to be functioning properly.  Blood pressure well controlled at 122/80 mmHg on currently prescribed beta-blocker (carvedilol), diuretic (furosemide + spironolactone), and ARB (losartan) therapies.  Patient is on rosuvastatin for her HLD diagnosis and ASCVD prevention.  She is not diabetic.  Patient does not have an OSAH diagnosis.  With improvement of her EF following CRT-D device placement, patient able to be more active. Patient is able to complete all of her  ADL/IADLs without cardiovascular limitation.  Per the DASI, patient is able to achieve at least 4 METS of physical activity without experiencing any significant degree of angina/anginal equivalent symptoms. No changes were made to her medication regimen.  Plans for repeat TTE. Patient follow-up with outpatient cardiology 4 months or sooner if needed.   Since patient was last seen by her cardiologist, she has undergone the recommended repeat functional study.  TTE performed on 03/17/2023 revealed a low normal left ventricular systolic function with an EF of 50%. There was severe LVH.  There were no regional wall motion abnormalities.  Anterior and inferior septal hypokinesis  Right ventricular size and function normal.  RVSP = 24 mmHg.  There was trivial aortic, mitral, and pulmonary valve regurgitation, in addition to mild tricuspid valve regurgitation. No transvalvular gradients were noted to be normal providing no evidence suggestive of valvular stenosis. Aortic root dilatated 3.4 cm and ascending aorta measured 3.6 cm.   Erin Stuart is scheduled for an elective  COMPUTER ASSISTED TOTAL KNEE ARTHROPLASTY (Left: Knee) on 04/21/2023 with Dr. Venetia Night, MD. Given patient's past medical history significant for cardiovascular diagnoses, presurgical cardiac clearance was sought by the PAT team. Per cardiology, "this patient is optimized for surgery and may proceed with the planned procedural course with a LOW risk of significant perioperative cardiovascular complications".  In review of the patient's chart, it is noted that she is on daily oral antithrombotic therapy. She stopped taking her daily low dose ASA on 04/10/2023 herself. Patient to restart as soon as postoperative bleeding risk felt to be minimized by his attending surgeon.  Patient reports previous perioperative complications with anesthesia in the past. Patient has a PMH (+) for PONV.  She reports a single remote episode.  Symptoms and history of PONV will be discussed with patient by anesthesia team on the day of her procedure. Interventions will be ordered as deemed necessary based on patient's individual care needs as determined by anesthesiologist. In review of the available records, it is noted that patient underwent a general anesthetic course at [facility] ([facility]) in 09/2022 without documented complications.      04/15/2023    8:10 AM 03/26/2022    8:30 AM 03/26/2022    5:32 AM  Vitals with BMI  Height 5\' 4"     Weight 215 lbs    BMI 36.89    Systolic 143 113 409  Diastolic 96 72 63  Pulse 85 61 63   Providers/Specialists:  NOTE: Primary physician provider listed below. Patient may have been seen by APP or partner within same practice.   PROVIDER ROLE / SPECIALTY LAST OV  Hooten, Illene Labrador, MD Orthopedics (Surgeon) 03/25/2023  Lynnea Ferrier, MD Primary Care Provider 03/31/2023  Marcina Millard, MD Cardiology 03/03/2023  Verdis Frederickson, MD Endocrinology 06/19/2022   Allergies:   Allergies  Allergen Reactions   Sulfa Antibiotics  Nausea And Vomiting    Pt states it felt like she had the flu.   Bactrim [Sulfamethoxazole-Trimethoprim] Nausea And Vomiting   Current Home Medications:   No current facility-administered medications for this encounter.    ALPRAZolam (XANAX) 1 MG tablet   amoxicillin (AMOXIL) 500 MG capsule   Ascorbic Acid (VITAMIN C) 1000 MG tablet   aspirin 81 MG tablet   carvedilol (COREG) 6.25 MG tablet   Cranberry-Vitamin C (AZO CRANBERRY URINARY TRACT PO)   Cyanocobalamin (B-12) 2500 MCG TABS   furosemide (LASIX) 20 MG tablet   ibuprofen (ADVIL) 200 MG tablet   ipratropium (ATROVENT) 0.03 % nasal spray   losartan (COZAAR) 25 MG tablet   rosuvastatin (CRESTOR) 5 MG tablet   spironolactone (ALDACTONE) 25 MG tablet   venlafaxine XR (EFFEXOR-XR) 75 MG 24 hr capsule   Vitamin D-Vitamin K (D3 + K2 PO)   Magnesium 250 MG TABS   oxymetazoline (MUCINEX SINUS-MAX SINUS/ALLRGY) 0.05 % nasal spray   senna-docusate (SENOKOT-S) 8.6-50 MG tablet   Vitamin D, Ergocalciferol, (DRISDOL) 1.25 MG (50000 UNIT) CAPS capsule   History:   Past Medical History:  Diagnosis Date   Acquired dilation of ascending aorta and aortic root (HCC) 05/22/2021   a.) TTE 05/22/2021: Ao root 3.8 cm; asc Ao 3.5 cm; b.) TTE 03/17/2023: Ao root 3.4 cm, asc Ao 3.6 cm   Anginal pain (HCC)    Anxiety    a.) on BZO (alprazolam) PRN   Asthma    CHF (congestive heart failure) (HCC)    Degenerative arthritis of knee, bilateral    Depression    Dilated cardiomyopathy (HCC)    a.) R/LHC 09/02/2016: EF <25%; b.) TTE 08/13/2016: EF <15%; c.) MPI 08/14/2016: EF 21%; d.) cMRI 09/12/2016: EF 22%, no LGE; e.) s/p CRT-D placement 11/27/2016; f.) TTE 01/09/2017: EF 25%; g.) TTE 09/11/2017: EF 25-30%; h.) TTE 10/09/2018: EF 35%; i.) TTE 09/10/2019: EF 45%; j.) TTE 08/04/2020: EF 45%; k.) TTE 05/22/2021: EF 45%; l.) TTE 03/17/2023: EF 50%   Diverticulosis    Essential hypertension    History of kidney stones    Hyperlipidemia     Hyperparathyroidism (HCC)    LBBB (left bundle branch block) 2012   Long-term use of aspirin therapy    Myocarditis, viral    Palpitations    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 Medtronic device   Pulmonary HTN (HCC) 08/13/2016   a.) TTE 08/13/2017: RVSP 41.2; b.) TTE 01/09/2017: RVSP 36.9; c.) TTE 09/11/2017: RVSP 30.4; d.) 10/09/2018: RVSP 24.3 mmHg; e.) TTE 09/10/2019: RVSP 25.1 mmHg; f.) TTE 03/10/2022: RVSP 19.6; g.) TTE 03/17/2023: RVSP 24 mmHg   Vitamin D deficiency    Past Surgical History:  Procedure Laterality Date   BI-VENTRICULAR IMPLANTABLE CARDIOVERTER DEFIBRILLATOR  (CRT-D) N/A 11/27/2016   Procedure: BI-VENTRICULAR IMPLANTABLE CARDIOVERTER DEFIBRILLATOR  (CRT-D); Location: [facility]; Surgeon: Gerre Pebbles, MD   COLONOSCOPY WITH PROPOFOL N/A 06/16/2020   Procedure: COLONOSCOPY WITH PROPOFOL;  Surgeon: Wyline Mood, MD;  Location: [facility] ENDOSCOPY;  Service: Gastroenterology;  Laterality: N/A;   CYSTOSCOPY/URETEROSCOPY/HOLMIUM LASER/STENT PLACEMENT Left 01/01/2022   Procedure: CYSTOSCOPY/URETEROSCOPY/HOLMIUM LASER/STENT PLACEMENT;  Surgeon: Riki Altes, MD;  Location: [facility] ORS;  Service: Urology;  Laterality: Left;  EXTRACORPOREAL SHOCK WAVE LITHOTRIPSY Left 11/29/2021   procedure not done   KNEE ARTHROPLASTY Right 03/25/2022   Procedure: COMPUTER ASSISTED TOTAL KNEE ARTHROPLASTY - RNFA;  Surgeon: Donato Heinz, MD;  Location: [facility] ORS;  Service: Orthopedics;  Laterality: Right;   PARATHYROIDECTOMY Right 09/16/2022   RIGHT/LEFT HEART CATH AND CORONARY ANGIOGRAPHY Bilateral 08/27/2016   Procedure: Right/Left Heart Cath and Coronary Angiography;  Surgeon: Dalia Heading, MD;  Location: [facility] INVASIVE CV LAB;  Service: Cardiovascular;  Laterality: Bilateral;   WISDOM TOOTH  EXTRACTION     Family History  Problem Relation Age of Onset   Factor V Leiden deficiency Father    Melanoma Father    Cancer Paternal Aunt        ovairan   Breast cancer Neg Hx    Social History   Tobacco Use   Smoking status: Never   Smokeless tobacco: Never  Substance Use Topics   Alcohol use: No   Pertinent Clinical Results:  LABS:  Hospital Outpatient Visit on 04/15/2023  Component Date Value Ref Range Status   CRP 04/15/2023 0.7  <1.0 mg/dL Final   Performed at [facility], [address], [place], [place] [address]   Sed Rate 04/15/2023 10  0 - 30 mm/hr Final   Performed at [facility], [address] [place], [place] [address]   MRSA, PCR 04/15/2023 NEGATIVE  NEGATIVE Final   Staphylococcus aureus 04/15/2023 NEGATIVE  NEGATIVE Final   Comment: (NOTE) The Xpert SA Assay (FDA approved for NASAL specimens in patients 64 years of age and older), is one component of a comprehensive surveillance program. It is not intended to diagnose infection nor to guide or monitor treatment. Performed at [facility], [address] [place], [place] [address]     ECG: Date: 04/15/2023  Time ECG obtained: 0851 AM Rate: 62 bpm Rhythm:  AV dual paced rhythm Axis (leads I and aVF): normal Intervals: PR 134 ms. QRS 150 ms. QTc 515 ms. ST segment and T wave changes: No evidence of acute T wave abnormalities or significant ST segment elevation or depression.  Comparison: Similar to previous tracing obtained on 11/15/2021   IMAGING / PROCEDURES: TRANSTHORACIC ECHOCARDIOGRAM performed on 03/17/2023 Mildly reduced LEFT ventricular systolic function with mild LVH; LVEF 50% Severe LVH Hypokinesis of the anterior and inferior septum Trivial AR and MR Mild TR and PR Normal gradients; no valvular stenosis Mildly dilated ascending aorta measuring up to 3.6 cm Mildly dilated aortic root measuring 3.4 cm RVSP = 24 mmHg No pericardial effusion  ABDOMEN 1  VIEW (KUB) performed on 12/07/2021 Previously noted LEFT ureteral calculus is not visualized on the current examination and may have passed in the interval Stable punctate calculi overlying the lower pole of the RIGHT kidney   CT RENAL STONE STUDY performed on 12/06/2021 4 mm LEFT ureteral calculus has now migrated distally near the left UVJ. Mild LEFT hydroureteronephrosis shows significant improvement since previous study. Tiny nonobstructing percent renal calculi. Colonic diverticulosis. No radiographic evidence of diverticulitis.   RIGHT/LEFT HEART CATHETERIZATION AND CORONARY ANGIOGRAPHY performed on 08/27/2016 Severely reduced RIGHT ventricular systolic function with an EF of 20-25% Global hypokinesis Moderately elevated LVEDP 23 mmHg Normal coronary arteries Mild mitral valve regurgitation Hemodynamics RA 12/13 (11) mmHg RV 46/8 (13) mmHg PA 42/22 (30) mmHg PCWP 27/29 (23) mmHg LV 133 mmHg . LVEDP: 23 mmHg AO 133/83 (86) mmHg PA saturation 65% AO saturation 97% Cardiac Output (Fick) 3.09  Cardiac Index (Fick) 1.62  CARDIAC MRI MORPHOLOGY AND FUNCTION WITH AND WITHOUT CONTRAST performed on 09/12/2016 The left ventricle is moderately enlarged in cavity size. The LVEF is 22%. There is severe, global systolic dysfunction. The right ventricle is normal in size with mild systolic dysfunction.    The left atrium is severely enlarged in cavity size. The right atrium is mildly enlarged.  There is moderate central, functional mitral regurgitation (C[place]entier Class 1).  Delayed enhancement imaging for viability is normal. No hyperenhancement to suggest myocardial infarction, scar, or infiltrative process.    MYOCARDIAL PERFUSION IMAGING STUDY (LEXISCAN) performed on 08/14/2016 Severely reduced left ventricular systolic function with an EF of 21% Global hypokinesis of the left ventricle  Inferoapical and inferoseptal akinesis with stress and rest No artifact Left ventricular  cavity size enlarged Moderate diffusion abnormality present in the inferoseptal and inferoapical region on stress images No evidence of ischemia  Impression and Plan:  Erin Stuart has been referred for pre-anesthesia review and clearance prior to her undergoing the planned anesthetic and procedural courses. Available labs, pertinent testing, and imaging results were personally reviewed by me in preparation for upcoming operative/procedural course. [facility] medical record has been updated following extensive record review and patient interview with PAT staff.   This patient has been appropriately cleared by cardiology with an overall LOW risk of experiencing significant perioperative cardiovascular complications. Based on clinical review performed today (04/17/23), barring any significant acute changes in the patient's overall condition, it is anticipated that she will be able to proceed with the planned surgical intervention. Any acute changes in clinical condition may necessitate her procedure being postponed and/or cancelled. Patient will meet with anesthesia team (MD and/or CRNA) on the day of her procedure for preoperative evaluation/assessment. Questions regarding anesthetic course will be fielded at that time.   Pre-surgical instructions were reviewed with the patient during his PAT appointment, and questions were fielded to satisfaction by PAT clinical staff. She has been instructed on which medications that she will need to hold prior to surgery, as well as the ones that have been deemed safe/appropriate to take on the day of his procedure. As part of the general education provided by PAT, patient made aware both verbally and in writing, that she would need to abstain from the use of any illegal substances during his perioperative course. She was advised that failure to follow the provided instructions could necessitate case cancellation or result in serious perioperative complications up to  and including death. Patient encouraged to contact PAT and/or her surgeon's office to discuss any questions or concerns that may arise prior to surgery; verbalized understanding.   Quentin Mulling, MSN, APRN, FNP-C, [facility]  Perioperative Services Nurse Practitioner Phone: [phone] Fax: [phone] 04/17/23 2:22 PM  NOTE: This note has been prepared using Dragon dictation software. Despite my best ability to proofread, there is always the potential that unintentional transcriptional errors may still occur from this process.

## 2023-04-20 ENCOUNTER — Encounter: Payer: Self-pay | Admitting: Orthopedic Surgery

## 2023-04-20 MED ORDER — CHLORHEXIDINE GLUCONATE 4 % EX SOLN
60.0000 mL | Freq: Once | CUTANEOUS | Status: AC
  Administered 2023-04-21: 4 via TOPICAL

## 2023-04-20 MED ORDER — GABAPENTIN 300 MG PO CAPS
300.0000 mg | ORAL_CAPSULE | Freq: Once | ORAL | Status: AC
  Administered 2023-04-21: 300 mg via ORAL

## 2023-04-20 MED ORDER — ORAL CARE MOUTH RINSE: 15.0000 mL | Freq: Once | OROMUCOSAL | Status: AC

## 2023-04-20 MED ORDER — TRANEXAMIC ACID-NACL 1000-0.7 MG/100ML-% IV SOLN
1000.0000 mg | INTRAVENOUS | Status: AC
  Administered 2023-04-21: 1000 mg via INTRAVENOUS

## 2023-04-20 MED ORDER — DEXAMETHASONE SODIUM PHOSPHATE 10 MG/ML IJ SOLN
8.0000 mg | Freq: Once | INTRAMUSCULAR | Status: AC
  Administered 2023-04-21: 8 mg via INTRAVENOUS

## 2023-04-20 MED ORDER — LACTATED RINGERS IV SOLN: INTRAVENOUS | Status: DC

## 2023-04-20 MED ORDER — CELECOXIB 200 MG PO CAPS
400.0000 mg | ORAL_CAPSULE | Freq: Once | ORAL | Status: AC
  Administered 2023-04-21: 400 mg via ORAL

## 2023-04-20 MED ORDER — CHLORHEXIDINE GLUCONATE 0.12 % MT SOLN
15.0000 mL | Freq: Once | OROMUCOSAL | Status: AC
Start: 2023-04-20 — End: 2023-04-21
  Administered 2023-04-21: 15 mL via OROMUCOSAL

## 2023-04-20 MED ORDER — CEFAZOLIN SODIUM-DEXTROSE 2-4 GM/100ML-% IV SOLN
2.0000 g | INTRAVENOUS | Status: AC
  Administered 2023-04-21: 2 g via INTRAVENOUS

## 2023-04-20 NOTE — H&P (Signed)
ORTHOPAEDIC HISTORY & PHYSICAL Erin Stuart, Erin Mings., MD - 03/25/2023 9:00 AM EST Formatting of this note is different from the original. Images from the original note were not included. Chief Complaint: Chief Complaint Patient presents with Total Joint- Follow Up Right total knee arthroplasty 03/25/22 History and Physical Left knee degenerative arthrosis, discuss total knee arthroplasty Surgery scheduled for 04/21/2023  Reason for Visit: The patient is a 58 y.o. female who presents today for reevaluation of both knees. She has a long history of left knee pain. She localizes most of the pain along the medial aspect of the knee. She reports some swelling, no locking, and some giving way of the knee. The pain is aggravated by any weight bearing. The knee pain limits the patient's ability to ambulate long distances. The patient has not appreciated any significant improvement despite Tylenol, NSAIDs, intraarticular corticosteroid injection , and activity modification. She is not using any ambulatory aids. She had been scheduled for left total knee arthroplasty last June, but surgery was postponed due to other medical issues.The patient states that the knee pain has progressed to the point that it is significantly interfering with her activities of daily living.  She is also 1 year status post right total knee arthroplasty. She denies any significant right knee pain, swelling, locking, or giving way. She states that the right knee is "doing great".  Medications: Current Outpatient Medications Medication Sig Dispense Refill acetaminophen/chlorpheniramine (CORICIDIN HBP COLD AND FLU ORAL) Take by mouth once as needed ALPRAZolam (XANAX) 1 MG tablet TAKE 1 TABLET BY MOUTH ONCE DAILY AS NEEDED FOR ANXIETY. 30 tablet 5 amoxicillin (AMOXIL) 500 MG capsule Take 4 capsules 1 hour before the dental procedure 4 capsule 2 aspirin 81 MG EC tablet Take 81 mg by mouth once daily. carvediloL (COREG) 6.25 MG  tablet Take 1 tablet (6.25 mg total) by mouth 2 (two) times daily with meals 180 tablet 3 FUROsemide (LASIX) 20 MG tablet Take 1 tablet (20 mg total) by mouth once daily 90 tablet 3 losartan (COZAAR) 25 MG tablet Take 1 tablet (25 mg total) by mouth once daily 90 tablet 3 multivitamin-Ca-iron-minerals Tab Take 1 tablet by mouth once daily oxymetazoline (MUCINEX SINUS-MAX) 0.05 % nasal spray Place into both nostrils once as needed for Congestion rosuvastatin (CRESTOR) 5 MG tablet TAKE 1 TABLET BY MOUTH EVERY DAY 90 tablet 3 spironolactone (ALDACTONE) 25 MG tablet take 1 tablet by mouth every day 90 tablet 2 tirzepatide (ZEPBOUND) 5 mg/0.5 mL pen injector Inject 0.5 mLs (5 mg total) subcutaneously every 7 (seven) days 2 mL 2 venlafaxine (EFFEXOR-XR) 75 MG XR capsule TAKE 1 CAPSULE BY MOUTH EVERY DAY 90 capsule 4  No current facility-administered medications for this visit.  Allergies: Allergies Allergen Reactions Sulfamethoxazole-Trimethoprim Nausea And Vomiting  Past Medical History: Past Medical History: Diagnosis Date Acquired dilation of ascending aorta and aortic root (CMS-HCC) 05/22/2021 a.) TTE 05/22/2021: Ao root 3.8 cm; asc Ao 3.5 cm Anxiety BZO (alprazolam) PRN ASCUS of cervix with negative high risk HPV 07/31/2021 Asthma (HHS-HCC) Cardiomyopathy, secondary (CMS/HHS-HCC) a.) R/LHC 09/02/2016: EF <25%; b.) TTE 08/13/2016: EF <15%; c.) MPI 08/14/2016: EF 21%; d.) cMRI 09/12/2016: EF 22%, no LGE; e.) s/p CRT-D placement 11/27/2016; f.) TTE 01/09/2017: EF 25%; g.) TTE 09/11/2017: EF 25-30%; h.) TTE 10/09/2018: EF 35%; i.) TTE 09/10/2019: EF 45%; j.) TTE 08/04/2020: EF 45%; k.) TTE 05/22/2021: EF 45% Chronic systolic heart failure (CMS/HHS-HCC) Depression Family history of factor V Leiden mutation dad, patient tested and was negative Family history of  melanoma dad Family history of ovarian cancer paternal aunt High cholesterol Hyperparathyroidism  (CMS/HHS-HCC) Hypertension Kidney stones Left bundle branch block 2012 Prediabetes 09/29/2021 Presence of permanent cardiac pacemaker Primary osteoarthritis of both knees Pulmonary HTN (CMS/HHS-HCC) 08/13/2016 a.) TTE 08/13/2017: RVSP 41.2; b.) TTE 01/09/2017: RVSP 36.9; c.) TTE 09/11/2017: RVSP 30.4 Vitamin D deficiency  Past Surgical History: Past Surgical History: Procedure Laterality Date RIGHT/LEFT HEART CATH AND CORONARY ANGIOGRAPHY Bilateral 08/27/2016 BI-VENTRICULAR IMPLANTABLE CARDIOVERTER DEFIBRILLATOR (CRT-D) 11/27/2016 COLONOSCOPY 06/16/2020 Kidney stone extraction 01/01/2022 CYSTOSCOPY/URETEROSCOPY/HOLMIUM LASER/STENT PLACEMENT Right total knee arthroplasty using computer-assisted navigation 03/25/2022 Dr Ernest Pine crtd Left INSERT / REPLACE / REMOVE PACEMAKER Wisdom teeth extraction In her 20s  Social History: Social History  Socioeconomic History Marital status: Married Spouse name: Daphine Deutscher Number of children: 3 Years of education: 14 Occupational History 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 Drug use: No Sexual activity: Yes Partners: Male Birth control/protection: Surgical Social History Narrative Married, 3 children Stays at home Denies tobacco/drug use Social etoh Caffeine: 4 pepsi daily Exercise: none  Social Drivers of Catering manager Strain: Low Risk (01/14/2023) Overall Financial Resource Strain (CARDIA) Difficulty of Paying Living Expenses: Not hard at all Food Insecurity: No Food Insecurity (01/14/2023) Hunger Vital Sign Worried About Running Out of Food in the Last Year: Never true Ran Out of Food in the Last Year: Never true Transportation Needs: No Transportation Needs  (01/14/2023) PRAPARE - Contractor (Medical): No Lack of Transportation (Non-Medical): No Housing Stability: Unknown (03/25/2023) Housing Stability Vital Sign Unable to Pay for Housing in the Last Year: No Homeless in the Last Year: No  Family History: Family History Problem Relation Name Age of Onset Coronary Artery Disease (Blocked arteries around heart) Mother 15 MI High blood pressure (Hypertension) Mother Hyperlipidemia (Elevated cholesterol) Mother Factor V Leiden deficiency Father Skin cancer Father melanoma High blood pressure (Hypertension) Father Diabetes type II Sister No Known Problems Daughter No Known Problems Son Ovarian cancer Paternal Aunt Diabetes type II Maternal Grandmother Coronary Artery Disease (Blocked arteries around heart) Maternal Grandfather 50 MI x2 No Known Problems Daughter Anesthesia problems Neg Hx  Review of Systems: A comprehensive 14 point ROS was performed, reviewed, and the pertinent orthopaedic findings are documented in the HPI.  Exam BP 132/78  Ht 162.6 cm (5\' 4" )  Wt 99.2 kg (218 lb 12.8 oz)  BMI 37.56 kg/m  General: Well-developed, well-nourished female seen in no acute distress. Antalgic gait. Varus thrust to the left knee.  HEENT: Atraumatic, normocephalic. Pupils are equal and reactive to light. Extraocular motion is intact. Sclera are clear. Oropharynx is clear with moist mucosa.  Neck: Supple, nontender, and with good ROM. No thyromegaly, adenopathy, JVD, or carotid bruits.  Lungs: Clear to auscultation bilaterally.  Cardiovascular: Regular rate and rhythm. Normal S1, S2. No murmur . No appreciable gallops or rubs. Peripheral pulses are palpable. No lower extremity edema. Homan`s test is negative.  Abdomen: Soft, nontender, nondistended. Bowel sounds are present.  Extremities: Good strength, stability, and range of motion of the upper extremities. Good range of motion of the hips and  ankles.  Left Knee: Soft tissue swelling: mild Effusion: none Erythema: none Crepitance: mild Tenderness: medial Alignment: relative varus Mediolateral laxity: medial pseudolaxity Posterior sag: negative Patellar tracking: Good tracking without evidence of subluxation or tilt Atrophy: No significant  atrophy. Quadriceps tone was good. Range of motion: 0/0/122 degrees  Right Knee: Soft tissue swelling: none Effusion: none Erythema: none Crepitance: none Tenderness: No focal tenderness Alignment: normal Mediolateral laxity: stable Atrophy: No significant atrophy. Quadriceps tone was good. Range of Motion: 0/0/130 degrees  Neurologic: Awake, alert, and oriented. Sensory function is intact to pinprick and light touch. Motor strength is judged to be 5/5. Motor coordination is within normal limits. No apparent clonus. No tremor.  X-rays: I ordered and interpreted standing AP, lateral, and sunrise radiographs of the left knee that were obtained in the office today. There is significant narrowing of the medial cartilage space with bone-on-bone articulation and associated varus alignment. Osteophyte formation is noted. Subchondral sclerosis is noted. Degenerative changes to the patellofemoral articulation are noted. No evidence of fracture or dislocation.  I ordered and interpreted standing AP, lateral, and sunrise views of the right knee that were obtained in the office today. Good position of the total knee implants. Good alignment is noted on the AP view. Good cement mantle is appreciated without evidence of loosening. No evidence of polyethylene wear or osteolysis. No evidence of fracture or dislocation.  Impression: Degenerative arthrosis of the left knee Right total knee arthroplasty  Plan: The findings were discussed in detail with the patient. She continues to do well with th.r knee. The patient was given informational material on total knee replacement. Conservative  treatment options were reviewed with the patient. We discussed the risks and benefits of surgical intervention. The usual perioperative course was also discussed in detail. The patient expressed understanding of the risks and benefits of surgical intervention and would like to proceed with plans for left total knee arthroplasty.  I spent a total of 45 minutes in both face-to-face and non-face-to-face activities, excluding procedures performed, for this visit on the date of this encounter.  MEDICAL CLEARANCE: Per anesthesiology. ACTIVITY: As tolerated. WORK STATUS: Not applicable. THERAPY: Preoperative physical therapy evaluation. MEDICATIONS: Requested Prescriptions  Signed Prescriptions Disp Refills amoxicillin (AMOXIL) 500 MG capsule 4 capsule 2 Sig: Take 4 capsules 1 hour before the dental procedure  FOLLOW-UP: Return for postoperative follow-up.  Burlene Montecalvo P. Angie Fava., M.D.  This note was generated in part with voice recognition software and I apologize for any typographical errors that were not detected and corrected.  Electronically signed by Shari Heritage., MD at 03/29/2023 9:26 PM EST

## 2023-04-21 ENCOUNTER — Encounter
Admission: RE | Disposition: A | Discharge status: Home / Self Care | Payer: Self-pay | Source: Ambulatory Visit | Attending: Orthopedic Surgery

## 2023-04-21 ENCOUNTER — Other Ambulatory Visit: Payer: Self-pay

## 2023-04-21 ENCOUNTER — Observation Stay: Payer: 59

## 2023-04-21 ENCOUNTER — Ambulatory Visit: Payer: 59 | Admitting: Urgent Care

## 2023-04-21 ENCOUNTER — Observation Stay
Admission: RE | Admit: 2023-04-21 | Discharge: 2023-04-22 | Disposition: A | Discharge status: Home / Self Care | Payer: 59 | Source: Ambulatory Visit | Attending: Orthopedic Surgery | Admitting: Orthopedic Surgery

## 2023-04-21 ENCOUNTER — Encounter: Payer: Self-pay | Admitting: Orthopedic Surgery

## 2023-04-21 DIAGNOSIS — M1712 Unilateral primary osteoarthritis, left knee: Principal | ICD-10-CM | POA: Insufficient documentation

## 2023-04-21 DIAGNOSIS — J45909 Unspecified asthma, uncomplicated: Secondary | ICD-10-CM | POA: Diagnosis not present

## 2023-04-21 DIAGNOSIS — M25569 Pain in unspecified knee: Secondary | ICD-10-CM

## 2023-04-21 DIAGNOSIS — R55 Syncope and collapse: Secondary | ICD-10-CM | POA: Diagnosis not present

## 2023-04-21 DIAGNOSIS — R599 Enlarged lymph nodes, unspecified: Secondary | ICD-10-CM

## 2023-04-21 DIAGNOSIS — I420 Dilated cardiomyopathy: Secondary | ICD-10-CM | POA: Diagnosis not present

## 2023-04-21 DIAGNOSIS — Z7982 Long term (current) use of aspirin: Secondary | ICD-10-CM | POA: Insufficient documentation

## 2023-04-21 DIAGNOSIS — Z96651 Presence of right artificial knee joint: Secondary | ICD-10-CM | POA: Diagnosis not present

## 2023-04-21 DIAGNOSIS — I110 Hypertensive heart disease with heart failure: Secondary | ICD-10-CM | POA: Insufficient documentation

## 2023-04-21 DIAGNOSIS — E785 Hyperlipidemia, unspecified: Secondary | ICD-10-CM | POA: Diagnosis not present

## 2023-04-21 DIAGNOSIS — I429 Cardiomyopathy, unspecified: Secondary | ICD-10-CM | POA: Diagnosis not present

## 2023-04-21 DIAGNOSIS — Z79899 Other long term (current) drug therapy: Secondary | ICD-10-CM | POA: Insufficient documentation

## 2023-04-21 DIAGNOSIS — I5022 Chronic systolic (congestive) heart failure: Secondary | ICD-10-CM | POA: Insufficient documentation

## 2023-04-21 DIAGNOSIS — R002 Palpitations: Secondary | ICD-10-CM

## 2023-04-21 DIAGNOSIS — Z96652 Presence of left artificial knee joint: Secondary | ICD-10-CM | POA: Diagnosis not present

## 2023-04-21 DIAGNOSIS — Z01812 Encounter for preprocedural laboratory examination: Secondary | ICD-10-CM

## 2023-04-21 HISTORY — PX: KNEE ARTHROPLASTY: SHX992

## 2023-04-21 HISTORY — DX: Long term (current) use of aspirin: Z79.82

## 2023-04-21 HISTORY — DX: Palpitations: R00.2

## 2023-04-21 SURGERY — ARTHROPLASTY, KNEE, TOTAL, USING IMAGELESS COMPUTER-ASSISTED NAVIGATION
Anesthesia: Spinal | Site: Knee | Laterality: Left

## 2023-04-21 MED ORDER — KETAMINE HCL 50 MG/5ML IJ SOSY
PREFILLED_SYRINGE | INTRAMUSCULAR | Status: DC | PRN
  Administered 2023-04-21 (×2): 10 mg via INTRAVENOUS

## 2023-04-21 MED ORDER — PROPOFOL 1000 MG/100ML IV EMUL
INTRAVENOUS | Status: AC
  Filled 2023-04-21: qty 100

## 2023-04-21 MED ORDER — STERILE WATER FOR IRRIGATION IR SOLN
Status: DC | PRN
  Administered 2023-04-21: 1000 mL

## 2023-04-21 MED ORDER — OXYCODONE HCL 5 MG PO TABS: 10.0000 mg | ORAL_TABLET | ORAL | Status: DC | PRN

## 2023-04-21 MED ORDER — FENTANYL CITRATE (PF) 100 MCG/2ML IJ SOLN
INTRAMUSCULAR | Status: AC
  Filled 2023-04-21: qty 2

## 2023-04-21 MED ORDER — MIDAZOLAM HCL 5 MG/5ML IJ SOLN
INTRAMUSCULAR | Status: DC | PRN
  Administered 2023-04-21: 2 mg via INTRAVENOUS

## 2023-04-21 MED ORDER — ONDANSETRON HCL 4 MG PO TABS
4.0000 mg | ORAL_TABLET | Freq: Four times a day (QID) | ORAL | Status: DC | PRN
Start: 2023-04-21 — End: 2023-04-22

## 2023-04-21 MED ORDER — SPIRONOLACTONE 25 MG PO TABS: 25.0000 mg | ORAL_TABLET | Freq: Every day | ORAL | Status: DC

## 2023-04-21 MED ORDER — METOCLOPRAMIDE HCL 10 MG PO TABS
10.0000 mg | ORAL_TABLET | Freq: Three times a day (TID) | ORAL | Status: DC
  Administered 2023-04-21 – 2023-04-22 (×3): 10 mg via ORAL
  Filled 2023-04-21 (×3): qty 1

## 2023-04-21 MED ORDER — ALUM & MAG HYDROXIDE-SIMETH 200-200-20 MG/5ML PO SUSP
30.0000 mL | ORAL | Status: DC | PRN
Start: 2023-04-21 — End: 2023-04-22

## 2023-04-21 MED ORDER — FUROSEMIDE 20 MG PO TABS: 20.0000 mg | ORAL_TABLET | Freq: Every day | ORAL | Status: DC

## 2023-04-21 MED ORDER — IPRATROPIUM BROMIDE 0.03 % NA SOLN: 2.0000 | Freq: Three times a day (TID) | NASAL | Status: DC | PRN

## 2023-04-21 MED ORDER — DIPHENHYDRAMINE HCL 12.5 MG/5ML PO ELIX
12.5000 mg | ORAL_SOLUTION | ORAL | Status: DC | PRN
Start: 2023-04-21 — End: 2023-04-22

## 2023-04-21 MED ORDER — ONDANSETRON HCL 4 MG/2ML IJ SOLN
INTRAMUSCULAR | Status: AC
  Filled 2023-04-21: qty 2

## 2023-04-21 MED ORDER — CELECOXIB 200 MG PO CAPS
ORAL_CAPSULE | ORAL | Status: AC
  Filled 2023-04-21: qty 1

## 2023-04-21 MED ORDER — ALPRAZOLAM 1 MG PO TABS
1.0000 mg | ORAL_TABLET | Freq: Every day | ORAL | Status: DC | PRN
  Administered 2023-04-21: 1 mg via ORAL
  Filled 2023-04-21: qty 1

## 2023-04-21 MED ORDER — BUPIVACAINE HCL (PF) 0.5 % IJ SOLN
INTRAMUSCULAR | Status: DC | PRN
  Administered 2023-04-21: 3 mL

## 2023-04-21 MED ORDER — TRANEXAMIC ACID-NACL 1000-0.7 MG/100ML-% IV SOLN
INTRAVENOUS | Status: AC
  Filled 2023-04-21: qty 100

## 2023-04-21 MED ORDER — CEFAZOLIN SODIUM-DEXTROSE 2-4 GM/100ML-% IV SOLN
2.0000 g | Freq: Four times a day (QID) | INTRAVENOUS | Status: AC
  Administered 2023-04-21 (×2): 2 g via INTRAVENOUS
  Filled 2023-04-21 (×2): qty 100

## 2023-04-21 MED ORDER — OXYCODONE HCL 5 MG PO TABS
5.0000 mg | ORAL_TABLET | ORAL | Status: DC | PRN
Start: 2023-04-21 — End: 2023-04-22
  Administered 2023-04-22 (×2): 5 mg via ORAL
  Filled 2023-04-21 (×2): qty 1

## 2023-04-21 MED ORDER — SODIUM CHLORIDE 0.9 % IV BOLUS
250.0000 mL | Freq: Once | INTRAVENOUS | Status: AC
  Administered 2023-04-21: 250 mL via INTRAVENOUS

## 2023-04-21 MED ORDER — MIDAZOLAM HCL 2 MG/2ML IJ SOLN
INTRAMUSCULAR | Status: AC
  Filled 2023-04-21: qty 2

## 2023-04-21 MED ORDER — SURGIRINSE WOUND IRRIGATION SYSTEM - OPTIME
TOPICAL | Status: DC | PRN
  Administered 2023-04-21: 450 mL via TOPICAL

## 2023-04-21 MED ORDER — ASPIRIN 81 MG PO CHEW
81.0000 mg | CHEWABLE_TABLET | Freq: Two times a day (BID) | ORAL | Status: DC
  Administered 2023-04-22: 81 mg via ORAL
  Filled 2023-04-21 (×2): qty 1

## 2023-04-21 MED ORDER — OXYCODONE HCL 5 MG PO TABS: 5.0000 mg | ORAL_TABLET | Freq: Once | ORAL | Status: DC | PRN

## 2023-04-21 MED ORDER — MAGNESIUM HYDROXIDE 400 MG/5ML PO SUSP
30.0000 mL | Freq: Every day | ORAL | Status: DC
  Administered 2023-04-21: 30 mL via ORAL
  Filled 2023-04-21 (×2): qty 30

## 2023-04-21 MED ORDER — KETAMINE HCL 50 MG/5ML IJ SOSY
PREFILLED_SYRINGE | INTRAMUSCULAR | Status: AC
  Filled 2023-04-21: qty 5

## 2023-04-21 MED ORDER — TRANEXAMIC ACID-NACL 1000-0.7 MG/100ML-% IV SOLN
1000.0000 mg | Freq: Once | INTRAVENOUS | Status: AC
  Administered 2023-04-21: 1000 mg via INTRAVENOUS

## 2023-04-21 MED ORDER — PROPOFOL 500 MG/50ML IV EMUL
INTRAVENOUS | Status: DC | PRN
  Administered 2023-04-21: 125 ug/kg/min via INTRAVENOUS

## 2023-04-21 MED ORDER — SODIUM CHLORIDE 0.9 % IV SOLN
INTRAVENOUS | Status: DC | PRN
  Administered 2023-04-21: 60 mL

## 2023-04-21 MED ORDER — SODIUM CHLORIDE 0.9 % IR SOLN
Status: DC | PRN
  Administered 2023-04-21: 3000 mL

## 2023-04-21 MED ORDER — TRAMADOL HCL 50 MG PO TABS
50.0000 mg | ORAL_TABLET | ORAL | Status: DC | PRN
  Administered 2023-04-21: 50 mg via ORAL
  Filled 2023-04-21: qty 1

## 2023-04-21 MED ORDER — PANTOPRAZOLE SODIUM 40 MG PO TBEC
40.0000 mg | DELAYED_RELEASE_TABLET | Freq: Two times a day (BID) | ORAL | Status: DC
  Administered 2023-04-21 – 2023-04-22 (×2): 40 mg via ORAL
  Filled 2023-04-21 (×2): qty 1

## 2023-04-21 MED ORDER — CHLORHEXIDINE GLUCONATE 0.12 % MT SOLN
OROMUCOSAL | Status: AC
  Filled 2023-04-21: qty 15

## 2023-04-21 MED ORDER — FENTANYL CITRATE (PF) 100 MCG/2ML IJ SOLN: 25.0000 ug | INTRAMUSCULAR | Status: DC | PRN

## 2023-04-21 MED ORDER — ONDANSETRON HCL 4 MG/2ML IJ SOLN: 4.0000 mg | Freq: Four times a day (QID) | INTRAMUSCULAR | Status: DC | PRN

## 2023-04-21 MED ORDER — DEXAMETHASONE SODIUM PHOSPHATE 10 MG/ML IJ SOLN
INTRAMUSCULAR | Status: DC | PRN
  Administered 2023-04-21: 8 mg via INTRAVENOUS

## 2023-04-21 MED ORDER — HYDROMORPHONE HCL 1 MG/ML IJ SOLN
0.5000 mg | INTRAMUSCULAR | Status: DC | PRN
Start: 2023-04-21 — End: 2023-04-22

## 2023-04-21 MED ORDER — ACETAMINOPHEN 10 MG/ML IV SOLN
1000.0000 mg | Freq: Four times a day (QID) | INTRAVENOUS | Status: DC
  Administered 2023-04-21 – 2023-04-22 (×3): 1000 mg via INTRAVENOUS
  Filled 2023-04-21 (×3): qty 100

## 2023-04-21 MED ORDER — CELECOXIB 200 MG PO CAPS
200.0000 mg | ORAL_CAPSULE | Freq: Two times a day (BID) | ORAL | Status: DC
  Administered 2023-04-21 – 2023-04-22 (×2): 200 mg via ORAL
  Filled 2023-04-21 (×2): qty 1

## 2023-04-21 MED ORDER — OXYCODONE HCL 5 MG/5ML PO SOLN: 5.0000 mg | Freq: Once | ORAL | Status: DC | PRN

## 2023-04-21 MED ORDER — CEFAZOLIN SODIUM-DEXTROSE 2-4 GM/100ML-% IV SOLN
INTRAVENOUS | Status: AC
  Filled 2023-04-21: qty 100

## 2023-04-21 MED ORDER — DEXAMETHASONE SODIUM PHOSPHATE 10 MG/ML IJ SOLN
INTRAMUSCULAR | Status: AC
  Filled 2023-04-21: qty 1

## 2023-04-21 MED ORDER — ONDANSETRON HCL 4 MG/2ML IJ SOLN
INTRAMUSCULAR | Status: DC | PRN
  Administered 2023-04-21: 4 mg via INTRAVENOUS

## 2023-04-21 MED ORDER — CARVEDILOL 3.125 MG PO TABS
6.2500 mg | ORAL_TABLET | Freq: Two times a day (BID) | ORAL | Status: DC
  Administered 2023-04-22: 6.25 mg via ORAL
  Filled 2023-04-21: qty 2

## 2023-04-21 MED ORDER — FERROUS SULFATE 325 (65 FE) MG PO TABS
325.0000 mg | ORAL_TABLET | Freq: Two times a day (BID) | ORAL | Status: DC
  Administered 2023-04-21 – 2023-04-22 (×2): 325 mg via ORAL
  Filled 2023-04-21 (×2): qty 1

## 2023-04-21 MED ORDER — BUPIVACAINE HCL (PF) 0.25 % IJ SOLN
INTRAMUSCULAR | Status: DC | PRN
  Administered 2023-04-21: 60 mL

## 2023-04-21 MED ORDER — LOSARTAN POTASSIUM 50 MG PO TABS: 25.0000 mg | ORAL_TABLET | Freq: Every day | ORAL | Status: DC

## 2023-04-21 MED ORDER — BISACODYL 10 MG RE SUPP: 10.0000 mg | Freq: Every day | RECTAL | Status: DC | PRN

## 2023-04-21 MED ORDER — ACETAMINOPHEN 10 MG/ML IV SOLN
INTRAVENOUS | Status: DC | PRN
  Administered 2023-04-21: 1000 mg via INTRAVENOUS

## 2023-04-21 MED ORDER — LIDOCAINE HCL (PF) 2 % IJ SOLN
INTRAMUSCULAR | Status: AC
  Filled 2023-04-21: qty 5

## 2023-04-21 MED ORDER — GABAPENTIN 300 MG PO CAPS
ORAL_CAPSULE | ORAL | Status: AC
  Filled 2023-04-21: qty 1

## 2023-04-21 MED ORDER — FLEET ENEMA RE ENEM
1.0000 | ENEMA | Freq: Once | RECTAL | Status: DC | PRN
Start: 2023-04-21 — End: 2023-04-22

## 2023-04-21 MED ORDER — ENSURE PRE-SURGERY PO LIQD
296.0000 mL | Freq: Once | ORAL | Status: AC
  Administered 2023-04-21: 296 mL via ORAL
  Filled 2023-04-21: qty 296

## 2023-04-21 MED ORDER — ACETAMINOPHEN 325 MG PO TABS: 325.0000 mg | ORAL_TABLET | Freq: Four times a day (QID) | ORAL | Status: DC | PRN

## 2023-04-21 MED ORDER — SENNOSIDES-DOCUSATE SODIUM 8.6-50 MG PO TABS
1.0000 | ORAL_TABLET | Freq: Two times a day (BID) | ORAL | Status: DC
  Administered 2023-04-21 – 2023-04-22 (×2): 1 via ORAL
  Filled 2023-04-21 (×2): qty 1

## 2023-04-21 MED ORDER — SODIUM CHLORIDE 0.9 % IV SOLN: INTRAVENOUS | Status: DC

## 2023-04-21 MED ORDER — MAGNESIUM OXIDE -MG SUPPLEMENT 400 (240 MG) MG PO TABS
200.0000 mg | ORAL_TABLET | Freq: Every day | ORAL | Status: DC
Start: 2023-04-22 — End: 2023-04-22
  Administered 2023-04-22: 200 mg via ORAL
  Filled 2023-04-21: qty 1

## 2023-04-21 MED ORDER — VENLAFAXINE HCL ER 75 MG PO CP24
75.0000 mg | ORAL_CAPSULE | Freq: Every day | ORAL | Status: DC
  Filled 2023-04-21: qty 1

## 2023-04-21 MED ORDER — PHENYLEPHRINE HCL-NACL 20-0.9 MG/250ML-% IV SOLN
INTRAVENOUS | Status: DC | PRN
  Administered 2023-04-21: 26.667 ug/min via INTRAVENOUS

## 2023-04-21 MED ORDER — BUPIVACAINE HCL (PF) 0.5 % IJ SOLN
INTRAMUSCULAR | Status: AC
  Filled 2023-04-21: qty 10

## 2023-04-21 MED ORDER — MENTHOL 3 MG MT LOZG: 1.0000 | LOZENGE | OROMUCOSAL | Status: DC | PRN

## 2023-04-21 MED ORDER — PHENOL 1.4 % MT LIQD: 1.0000 | OROMUCOSAL | Status: DC | PRN

## 2023-04-21 MED ORDER — ACETAMINOPHEN 10 MG/ML IV SOLN
INTRAVENOUS | Status: AC
  Filled 2023-04-21: qty 100

## 2023-04-21 MED ORDER — ROSUVASTATIN CALCIUM 5 MG PO TABS
5.0000 mg | ORAL_TABLET | Freq: Every day | ORAL | Status: DC
  Administered 2023-04-21: 5 mg via ORAL
  Filled 2023-04-21: qty 1

## 2023-04-21 MED ORDER — FENTANYL CITRATE (PF) 100 MCG/2ML IJ SOLN
INTRAMUSCULAR | Status: DC | PRN
  Administered 2023-04-21: 50 ug via INTRAVENOUS

## 2023-04-21 SURGICAL SUPPLY — 64 items
ATTUNE MED DOME PAT 32 KNEE (Knees) IMPLANT
ATTUNE PSFEM LTSZ5 NARCEM KNEE (Femur) IMPLANT
ATTUNE PSRP INSR SZ5 5 KNEE (Insert) IMPLANT
BASEPLATE TIBIAL ROTATING SZ 4 (Knees) IMPLANT
BATTERY INSTRU NAVIGATION (MISCELLANEOUS) ×4 IMPLANT
BIT DRILL QUICK REL 1/8 2PK SL (BIT) ×1 IMPLANT
BLADE CLIPPER SURG (BLADE) IMPLANT
BLADE SAW 70X12.5 (BLADE) ×1 IMPLANT
BLADE SAW 90X13X1.19 OSCILLAT (BLADE) ×1 IMPLANT
BLADE SAW 90X25X1.19 OSCILLAT (BLADE) ×1 IMPLANT
BONE CEMENT GENTAMICIN (Cement) ×2 IMPLANT
BRUSH SCRUB EZ PLAIN DRY (MISCELLANEOUS) ×1 IMPLANT
CEMENT BONE GENTAMICIN 40 (Cement) IMPLANT
COOLER POLAR GLACIER W/PUMP (MISCELLANEOUS) ×1 IMPLANT
CUFF TRNQT CYL 30X4X21-28X (TOURNIQUET CUFF) IMPLANT
DRAPE SHEET LG 3/4 BI-LAMINATE (DRAPES) ×1 IMPLANT
DRSG AQUACEL AG ADV 3.5X14 (GAUZE/BANDAGES/DRESSINGS) ×1 IMPLANT
DRSG MEPILEX SACRM 8.7X9.8 (GAUZE/BANDAGES/DRESSINGS) ×1 IMPLANT
DRSG TEGADERM 4X4.75 (GAUZE/BANDAGES/DRESSINGS) ×1 IMPLANT
DURAPREP 26ML APPLICATOR (WOUND CARE) ×2 IMPLANT
ELECT CAUTERY BLADE 6.4 (BLADE) ×1 IMPLANT
ELECT REM PT RETURN 9FT ADLT (ELECTROSURGICAL) ×1
ELECTRODE REM PT RTRN 9FT ADLT (ELECTROSURGICAL) ×1 IMPLANT
EVACUATOR 1/8 PVC DRAIN (DRAIN) ×1 IMPLANT
EX-PIN ORTHOLOCK NAV 4X150 (PIN) ×2 IMPLANT
GAUZE XEROFORM 1X8 LF (GAUZE/BANDAGES/DRESSINGS) ×1 IMPLANT
GLOVE BIOGEL M STRL SZ7.5 (GLOVE) ×6 IMPLANT
GLOVE SURG UNDER POLY LF SZ8 (GLOVE) ×2 IMPLANT
GOWN STRL REUS W/ TWL LRG LVL3 (GOWN DISPOSABLE) ×1 IMPLANT
GOWN STRL REUS W/ TWL XL LVL3 (GOWN DISPOSABLE) ×1 IMPLANT
GOWN TOGA ZIPPER T7+ PEEL AWAY (MISCELLANEOUS) ×1 IMPLANT
HOLDER FOLEY CATH W/STRAP (MISCELLANEOUS) ×1 IMPLANT
HOOD PEEL AWAY T7 (MISCELLANEOUS) ×1 IMPLANT
IV NS IRRIG 3000ML ARTHROMATIC (IV SOLUTION) ×1 IMPLANT
KIT TURNOVER KIT A (KITS) ×1 IMPLANT
KNIFE SCULPS 14X20 (INSTRUMENTS) ×1 IMPLANT
MANIFOLD NEPTUNE II (INSTRUMENTS) ×2 IMPLANT
NDL SPNL 20GX3.5 QUINCKE YW (NEEDLE) ×2 IMPLANT
NEEDLE SPNL 20GX3.5 QUINCKE YW (NEEDLE) ×2
PACK TOTAL KNEE (MISCELLANEOUS) ×1 IMPLANT
PAD ABD DERMACEA PRESS 5X9 (GAUZE/BANDAGES/DRESSINGS) ×2 IMPLANT
PAD ARMBOARD 7.5X6 YLW CONV (MISCELLANEOUS) ×3 IMPLANT
PAD WRAPON POLAR KNEE (MISCELLANEOUS) ×1 IMPLANT
PENCIL SMOKE EVACUATOR COATED (MISCELLANEOUS) ×1 IMPLANT
PIN DRILL FIX HALF THREAD (BIT) ×2 IMPLANT
PIN FIXATION 1/8DIA X 3INL (PIN) ×1 IMPLANT
PULSAVAC PLUS IRRIG FAN TIP (DISPOSABLE) ×1
SOLUTION IRRIG SURGIPHOR (IV SOLUTION) ×1 IMPLANT
SPONGE DRAIN TRACH 4X4 STRL 2S (GAUZE/BANDAGES/DRESSINGS) ×1 IMPLANT
STAPLER SKIN PROX 35W (STAPLE) ×1 IMPLANT
STOCKINETTE STRL BIAS CUT 8X4 (MISCELLANEOUS) ×1 IMPLANT
STRAP TIBIA SHORT (MISCELLANEOUS) ×1 IMPLANT
SUCTION TUBE FRAZIER 10FR DISP (SUCTIONS) ×1 IMPLANT
SUT VIC AB 0 CT1 36 (SUTURE) ×1 IMPLANT
SUT VIC AB 1 CT1 36 (SUTURE) ×2 IMPLANT
SUT VIC AB 2-0 CT2 27 (SUTURE) ×1 IMPLANT
SYR 30ML LL (SYRINGE) ×2 IMPLANT
TIP FAN IRRIG PULSAVAC PLUS (DISPOSABLE) ×1 IMPLANT
TOWEL OR 17X26 4PK STRL BLUE (TOWEL DISPOSABLE) ×1 IMPLANT
TOWER CARTRIDGE SMART MIX (DISPOSABLE) ×1 IMPLANT
TRAP FLUID SMOKE EVACUATOR (MISCELLANEOUS) ×1 IMPLANT
TRAY FOLEY MTR SLVR 16FR STAT (SET/KITS/TRAYS/PACK) ×1 IMPLANT
WATER STERILE IRR 1000ML POUR (IV SOLUTION) ×1 IMPLANT
WRAPON POLAR PAD KNEE (MISCELLANEOUS) ×1

## 2023-04-21 NOTE — Consult Note (Signed)
Initial Consultation Note   Patient: Erin Stuart UJW:[id number] DOB: [birth year] PCP: Lynnea Ferrier, MD DOA: 04/21/2023 DOS: the patient was seen and examined on 04/21/2023 Primary service: Donato Heinz, MD  Referring physician:  Leron Croak,  MD Reason for consult: Medical management  Assessment/Plan: Assessment and Plan:  Primary osteoarthritis of the left knee s/p total knee arthroplasty   Transient hypotension Blood pressures were temporarily 77/52.  Patient had been given 250 mL bolus of IV fluids with improvement up to 105/63.     [facility] will continue to follow the patient.  HPI: Erin Stuart is a 58 y.o. female with past medical history of hypertension, hyperlipidemia, dilated cardiomyopathy, heart failure with reduced EF, pulmonary artery hypertension, kidney stones, anxiety, depression,  Review of Systems: {ROS_Text:26778} Past Medical History:  Diagnosis Date   Acquired dilation of ascending aorta and aortic root (HCC) 05/22/2021   a.) TTE 05/22/2021: Ao root 3.8 cm; asc Ao 3.5 cm; b.) TTE 03/17/2023: Ao root 3.4 cm, asc Ao 3.6 cm   Anginal pain (HCC)    Anxiety    a.) on BZO (alprazolam) PRN   Asthma    CHF (congestive heart failure) (HCC)    Degenerative arthritis of knee, bilateral    Depression    Dilated cardiomyopathy (HCC)    a.) R/LHC 09/02/2016: EF <25%; b.) TTE 08/13/2016: EF <15%; c.) MPI 08/14/2016: EF 21%; d.) cMRI 09/12/2016: EF 22%, no LGE; e.) s/p CRT-D placement 11/27/2016; f.) TTE 01/09/2017: EF 25%; g.) TTE 09/11/2017: EF 25-30%; h.) TTE 10/09/2018: EF 35%; i.) TTE 09/10/2019: EF 45%; j.) TTE 08/04/2020: EF 45%; k.) TTE 05/22/2021: EF 45%; l.) TTE 03/17/2023: EF 50%   Diverticulosis    Essential hypertension    History of kidney stones    Hyperlipidemia    Hyperparathyroidism (HCC)    LBBB (left bundle branch block) 2012   Long-term use of aspirin therapy    Myocarditis, viral    Palpitations    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 Medtronic device   Pulmonary HTN (HCC) 08/13/2016   a.) TTE 08/13/2017: RVSP 41.2; b.) TTE 01/09/2017: RVSP 36.9; c.) TTE 09/11/2017: RVSP 30.4; d.) 10/09/2018: RVSP 24.3 mmHg; e.) TTE 09/10/2019: RVSP 25.1 mmHg; f.) TTE 03/10/2022: RVSP 19.6; g.) TTE 03/17/2023: RVSP 24 mmHg   Vitamin D deficiency    Past Surgical History:  Procedure Laterality Date   BI-VENTRICULAR IMPLANTABLE CARDIOVERTER DEFIBRILLATOR  (CRT-D) N/A 11/27/2016   Procedure: BI-VENTRICULAR IMPLANTABLE CARDIOVERTER DEFIBRILLATOR  (CRT-D); Location: [facility]; Surgeon: Gerre Pebbles, MD   COLONOSCOPY WITH PROPOFOL N/A 06/16/2020   Procedure: COLONOSCOPY WITH PROPOFOL;  Surgeon: Wyline Mood, MD;  [facility] ENDOSCOPY;  Service: Gastroenterology;  Laterality: N/A;   CYSTOSCOPY/URETEROSCOPY/HOLMIUM LASER/STENT PLACEMENT Left 01/01/2022   Procedure: CYSTOSCOPY/URETEROSCOPY/HOLMIUM LASER/STENT PLACEMENT;  Surgeon: Riki Altes, MD;  Location: [facility] ORS;  Service: Urology;  Laterality: Left;   EXTRACORPOREAL SHOCK WAVE LITHOTRIPSY Left 11/29/2021   procedure not done   KNEE ARTHROPLASTY Right 03/25/2022   Procedure: COMPUTER ASSISTED TOTAL KNEE ARTHROPLASTY - RNFA;  Surgeon: Donato Heinz, MD;  Location: [facility] ORS;  Service: Orthopedics;  Laterality: Right;   PARATHYROIDECTOMY Right 09/16/2022   RIGHT/LEFT HEART CATH AND CORONARY ANGIOGRAPHY Bilateral 08/27/2016   Procedure: Right/Left Heart Cath and Coronary Angiography;  Surgeon: Dalia Heading,  MD;  Location: [facility] INVASIVE CV LAB;  Service: Cardiovascular;  Laterality: Bilateral;   WISDOM TOOTH EXTRACTION     Social History:  reports that she has never smoked. She has never used smokeless tobacco. She reports that she does not drink alcohol and does not use drugs.  Allergies   Allergen Reactions   Sulfa Antibiotics Nausea And Vomiting    Pt states it felt like she had the flu.   Bactrim [Sulfamethoxazole-Trimethoprim] Nausea And Vomiting    Family History  Problem Relation Age of Onset   Factor V Leiden deficiency Father    Melanoma Father    Cancer Paternal Aunt        ovairan   Breast cancer Neg Hx     Prior to Admission medications   Medication Sig Start Date End Date Taking? Authorizing Provider  ALPRAZolam Prudy Feeler) 1 MG tablet Take 1 tablet (1 mg total) by mouth 3 (three) times daily as needed for anxiety. Patient taking differently: Take 1 mg by mouth daily as needed for anxiety. 02/18/17  Yes Shambley, Melody N, CNM  amoxicillin (AMOXIL) 500 MG capsule Take 2,000 mg by mouth See admin instructions. Take 2000 mg 1 hour prior to dental work   Yes [provider]  Ascorbic Acid (VITAMIN C) 1000 MG tablet Take 1,000 mg by mouth daily.   Yes [provider]  aspirin 81 MG tablet Take 81 mg by mouth every evening. Reported on 03/02/2015   Yes [provider]  carvedilol (COREG) 6.25 MG tablet Take 6.25 mg by mouth 2 (two) times daily. 04/28/20  Yes [provider]  Cranberry-Vitamin C (AZO CRANBERRY URINARY TRACT PO) Take 2 tablets by mouth daily.   Yes [provider]  Cyanocobalamin (B-12) 2500 MCG TABS Take 2,500 mcg by mouth daily.   Yes [provider]  furosemide (LASIX) 20 MG tablet Take 20 mg by mouth daily.   Yes [provider]  ibuprofen (ADVIL) 200 MG tablet Take 800 mg by mouth daily as needed for moderate pain (pain score 4-6).   Yes [provider]  ipratropium (ATROVENT) 0.03 % nasal spray Place 2 sprays into both nostrils 3 (three) times daily as needed.   Yes [provider]  losartan (COZAAR) 25 MG tablet Take 25 mg by mouth at bedtime. 04/28/20  Yes [provider]  Magnesium 250 MG TABS Take 1 tablet by mouth daily.   Yes [provider]   oxymetazoline (MUCINEX SINUS-MAX SINUS/ALLRGY) 0.05 % nasal spray Place 1 spray into both nostrils daily as needed for congestion.   Yes [provider]  rosuvastatin (CRESTOR) 5 MG tablet Take 5 mg by mouth at bedtime. 05/17/20  Yes [provider]  senna-docusate (SENOKOT-S) 8.6-50 MG tablet Take 1 tablet by mouth daily as needed for mild constipation.   Yes [provider]  spironolactone (ALDACTONE) 25 MG tablet Take 25 mg by mouth daily. 05/16/20  Yes [provider]  venlafaxine XR (EFFEXOR-XR) 75 MG 24 hr capsule Take 75 mg by mouth at bedtime.   Yes [provider]  Vitamin D, Ergocalciferol, (DRISDOL) 1.25 MG (50000 UNIT) CAPS capsule Take 50,000 Units by mouth every 7 (seven) days.   Yes [provider]  Vitamin D-Vitamin K (D3 + K2 PO) Take 1 capsule by mouth daily.   Yes [provider]    Physical Exam: Vitals:   04/21/23 2212 04/21/23 2215 04/21/23 2220 04/21/23 2225  BP: (!) 89/54 (!) 90/59 106/61 (!) 11/91  Pulse: 72 73 74 74  Resp:      Temp:      TempSrc:      SpO2: 94% 94% 94% 93%  Weight:      Height:       *** Data Reviewed:  {Tip this will not be part of the note when signed- Document your independent interpretation of telemetry tracing, EKG, lab, Radiology test or any other diagnostic tests. Add any new diagnostic test ordered today. (Optional):26781} {Results:26384}  {Tip this will not be part of the note when signed  DVT Prophylaxis  ., Place ted hose  Scds  (Optional):26781}  Family Communication: *** Primary team communication: *** Thank you very much for involving [place] in the care of your patient.  Author: Clydie Braun, MD 04/21/2023 10:40 PM  For on call review [email or web address].

## 2023-04-21 NOTE — Consult Note (Incomplete)
Initial Consultation Note   Patient: Erin Stuart VWU:[id number] DOB: [birth year] PCP: Lynnea Ferrier, MD DOA: 04/21/2023 DOS: the patient was seen and examined on 04/21/2023 Primary service: Donato Heinz, MD  Referring physician:  Leron Croak,  MD Reason for consult: Medical management   Assessment and Plan:  Primary osteoarthritis of the left knee s/p total knee arthroplasty Patient's status post left total knee arthroplasty using computer assisted navigation    Syncope and collapse She reported feeling lightheaded and dizzy postoperatively when asked to ambulate to the commode.  Thereafter was left unattended and subsequently passed out on the toilet seat hitting her head.  CT scan of the brain did not note any acute intercranial abnormality.  Blood pressures were temporarily 77/52.  Patient had been given 250 mL bolus of IV fluids with improvement up to 105/63.  She denies having any chest discomfort or feelings that her AICD had fired.  Suspect likely orthostatic hypotension as the cause for her symptoms may have been related with recent anesthesia.  Low suspicion for arrhythmia. -Strict bedrest until in a.m. -Ice packs as needed -Held patient's furosemide and spironolactone till 2/5 -Follow-up telemetry overnight  Knee pain Following syncopal episode patient reported both knees being folded underneath her and having significant pain in the left knee.  Dilated cardiomyopathy Last echocardiogram performed on 02/2023 noted EF to be improved up to 50%. -Strict I&Os daily weights   Anxiety and depression   TRH will continue to follow the patient.  HPI: Erin Stuart is a 58 y.o. female with past medical history of hypertension, hyperlipidemia, dilated cardiomyopathy s/p AICD, pulmonary artery hypertension, kidney stones, anxiety, and depression presented due to left knee pain for total knee arthroplasty.  Patient underwent the procedure with Dr. Ernest Pine today.  This  morning she reports taking her Coreg and and a Xanax but had not taken any other of her home medications.She experienced significant lightheadedness after waking up from a procedure, feeling as though she might pass out. Despite this, she was assisted to the bathroom, where she landed harshly on the toilet due to dizziness and subsequently passed out. She was found with both knees folded underneath her. No chest pain was reported, and she attributes the incident to insufficient recovery time post-anesthesia and does not feel symptoms were related to her heart.  She reports pain in the back of her right knee, which underwent surgery last year. The pain is located at the kneecap, and she was found stuck between a walker and a utility cart after the fall.  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 She has a pacemaker named 'Lola' due to a left ventricular block and reports no issues with it during this incident. She can usually feel her heart pacing when the   pacemaker is active. A recent echocardiogram showed an ejection fraction of 52%, improved from a previous 12%.  She takes furosemide, which has been reduced to every other day due to improved condition.  Her mother recently had a heart transplant and is doing well.     Review of Systems: As mentioned in the history of present illness. All other systems reviewed and are negative. Past Medical History:  Diagnosis Date  . Acquired dilation of ascending aorta and aortic root (HCC) 05/22/2021   a.) TTE 05/22/2021: Ao root 3.8 cm; asc Ao 3.5 cm; b.) TTE 03/17/2023: Ao  root 3.4 cm, asc Ao 3.6 cm  . Anginal pain (HCC)   . Anxiety    a.) on BZO (alprazolam) PRN  . Asthma   . CHF (congestive heart failure) (HCC)   . Degenerative arthritis of knee, bilateral   . Depression   . Dilated cardiomyopathy (HCC)    a.) R/LHC 09/02/2016: EF <25%; b.) TTE  08/13/2016: EF <15%; c.) MPI 08/14/2016: EF 21%; d.) cMRI 09/12/2016: EF 22%, no LGE; e.) s/p CRT-D placement 11/27/2016; f.) TTE 01/09/2017: EF 25%; g.) TTE 09/11/2017: EF 25-30%; h.) TTE 10/09/2018: EF 35%; i.) TTE 09/10/2019: EF 45%; j.) TTE 08/04/2020: EF 45%; k.) TTE 05/22/2021: EF 45%; l.) TTE 03/17/2023: EF 50%  . Diverticulosis   . Essential hypertension   . History of kidney stones   . Hyperlipidemia   . Hyperparathyroidism (HCC)   . LBBB (left bundle branch block) 2012  . Long-term use of aspirin therapy   . Myocarditis, viral   . Palpitations   . 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 PONV (postoperative nausea and vomiting)    a.) single remote episode  . Pre-diabetes   . Presence of cardiac resynchronization therapy defibrillator (CRT-D) 11/27/2016   a. Medtronic device  . Pulmonary HTN (HCC) 08/13/2016   a.) TTE 08/13/2017: RVSP 41.2; b.) TTE 01/09/2017: RVSP 36.9; c.) TTE 09/11/2017: RVSP 30.4; d.) 10/09/2018: RVSP 24.3 mmHg; e.) TTE 09/10/2019: RVSP 25.1 mmHg; f.) TTE 03/10/2022: RVSP 19.6; g.) TTE 03/17/2023: RVSP 24 mmHg  . Vitamin D deficiency    Past Surgical History:  Procedure Laterality Date  . BI-VENTRICULAR IMPLANTABLE CARDIOVERTER DEFIBRILLATOR  (CRT-D) N/A 11/27/2016   Procedure: BI-VENTRICULAR IMPLANTABLE CARDIOVERTER DEFIBRILLATOR  (CRT-D); Location: [facility]; Surgeon: Gerre Pebbles, MD  . COLONOSCOPY WITH PROPOFOL N/A 06/16/2020   Procedure: COLONOSCOPY WITH PROPOFOL;  Surgeon: Wyline Mood, MD;  Location: [facility] ENDOSCOPY;  Service: Gastroenterology;  Laterality: N/A;  . CYSTOSCOPY/URETEROSCOPY/HOLMIUM LASER/STENT PLACEMENT Left 01/01/2022   Procedure: CYSTOSCOPY/URETEROSCOPY/HOLMIUM LASER/STENT PLACEMENT;  Surgeon: Riki Altes, MD;  Location: [facility] ORS;  Service: Urology;  Laterality: Left;  . EXTRACORPOREAL SHOCK WAVE LITHOTRIPSY Left 11/29/2021   procedure not done  . KNEE ARTHROPLASTY Right 03/25/2022   Procedure: COMPUTER  ASSISTED TOTAL KNEE ARTHROPLASTY - RNFA;  Surgeon: Donato Heinz, MD;  Location: [facility] ORS;  Service: Orthopedics;  Laterality: Right;  . PARATHYROIDECTOMY Right 09/16/2022  . RIGHT/LEFT HEART CATH AND CORONARY ANGIOGRAPHY Bilateral 08/27/2016   Procedure: Right/Left Heart Cath and Coronary Angiography;  Surgeon: Dalia Heading, MD;  Location: [facility] INVASIVE CV LAB;  Service: Cardiovascular;  Laterality: Bilateral;  . WISDOM TOOTH EXTRACTION     Social History:  reports that she has never smoked. She has never used smokeless tobacco. She reports that she does not drink alcohol and does not use drugs.  Allergies  Allergen Reactions  . Sulfa Antibiotics Nausea And Vomiting    Pt states it felt like she had the flu.  . Bactrim [Sulfamethoxazole-Trimethoprim] Nausea And Vomiting    Family History  Problem Relation Age of Onset  . Factor V Leiden deficiency Father   . Melanoma Father   . Cancer Paternal Aunt        ovairan  . Breast cancer Neg Hx     Prior to Admission medications   Medication Sig Start Date End Date Taking? Authorizing Provider  ALPRAZolam Prudy Feeler) 1 MG tablet Take 1 tablet (1 mg total) by mouth 3 (three) times daily as needed for anxiety. Patient taking differently: Take 1  mg by mouth daily as needed for anxiety. 02/18/17  Yes Shambley, Melody N, CNM  amoxicillin (AMOXIL) 500 MG capsule Take 2,000 mg by mouth See admin instructions. Take 2000 mg 1 hour prior to dental work   Yes [provider]  Ascorbic Acid (VITAMIN C) 1000 MG tablet Take 1,000 mg by mouth daily.   Yes [provider]  aspirin 81 MG tablet Take 81 mg by mouth every evening. Reported on 03/02/2015   Yes [provider]  carvedilol (COREG) 6.25 MG tablet Take 6.25 mg by mouth 2 (two) times daily. 04/28/20  Yes [provider]  Cranberry-Vitamin C (AZO CRANBERRY URINARY TRACT PO) Take 2 tablets by mouth daily.   Yes [provider]  Cyanocobalamin (B-12) 2500  MCG TABS Take 2,500 mcg by mouth daily.   Yes [provider]  furosemide (LASIX) 20 MG tablet Take 20 mg by mouth daily.   Yes [provider]  ibuprofen (ADVIL) 200 MG tablet Take 800 mg by mouth daily as needed for moderate pain (pain score 4-6).   Yes [provider]  ipratropium (ATROVENT) 0.03 % nasal spray Place 2 sprays into both nostrils 3 (three) times daily as needed.   Yes [provider]  losartan (COZAAR) 25 MG tablet Take 25 mg by mouth at bedtime. 04/28/20  Yes [provider]  Magnesium 250 MG TABS Take 1 tablet by mouth daily.   Yes [provider]  oxymetazoline (MUCINEX SINUS-MAX SINUS/ALLRGY) 0.05 % nasal spray Place 1 spray into both nostrils daily as needed for congestion.   Yes [provider]  rosuvastatin (CRESTOR) 5 MG tablet Take 5 mg by mouth at bedtime. 05/17/20  Yes [provider]  senna-docusate (SENOKOT-S) 8.6-50 MG tablet Take 1 tablet by mouth daily as needed for mild constipation.   Yes [provider]  spironolactone (ALDACTONE) 25 MG tablet Take 25 mg by mouth daily. 05/16/20  Yes [provider]  venlafaxine XR (EFFEXOR-XR) 75 MG 24 hr capsule Take 75 mg by mouth at bedtime.   Yes [provider]  Vitamin D, Ergocalciferol, (DRISDOL) 1.25 MG (50000 UNIT) CAPS capsule Take 50,000 Units by mouth every 7 (seven) days.   Yes [provider]  Vitamin D-Vitamin K (D3 + K2 PO) Take 1 capsule by mouth daily.   Yes [provider]    Physical Exam: Vitals:   04/21/23 2212 04/21/23 2215 04/21/23 2220 04/21/23 2225  BP: (!) 89/54 (!) 90/59 106/61 (!) 99/47  Pulse: 72 73 74 74  Resp:      Temp:      TempSrc:      SpO2: 94% 94% 94% 93%  Weight:      Height:       Exam  Constitutional: Middle-age female who appears to be in no acute distress with ice pack over her forehead.  Bruising noted of the forehead Eyes: PERRL, lids and conjunctivae  normal ENMT: Mucous membranes are moist. Normal dentition.  Neck: normal, supple, no JVD appreciated  Respiratory: clear to auscultation bilaterally, no wheezing, no crackles. Normal respiratory effort. No accessory muscle use.  Cardiovascular: Regular rate and rhythm, no murmurs / rubs / gallops. No extremity edema. Abdomen: no tenderness, no masses palpated. No hepatosplenomegaly. Bowel sounds positive.  Musculoskeletal: no clubbing / cyanosis.  Right leg currently wrapped Skin: no rashes, lesions, ulcers. No induration Neurologic: CN 2-12 grossly intact. Sensation intact, DTR normal. Strength 5/5 in all 4.  Psychiatric: Normal judgment and insight.  Alert and oriented x 3. Normal mood.   Data Reviewed:  {Tip this will not be part of the note when signed- Document your independent interpretation of telemetry tracing, EKG, lab, Radiology test or any other diagnostic tests. Add any new diagnostic test ordered today. (Optional):26781} {Results:26384}  {Tip this will not be part of the note when signed  DVT Prophylaxis  ., Place ted hose  Scds  (Optional):26781}  Family Communication: *** Primary team communication: *** Thank you very much for involving [place] in the care of your patient.  Author: Clydie Braun, MD 04/21/2023 10:40 PM  For on call review [email or web address].

## 2023-04-21 NOTE — Op Note (Signed)
OPERATIVE NOTE  DATE OF SURGERY:  04/21/2023  PATIENT NAME:  Erin Stuart   DOB: 1965/12/22  MRN: [id number]  PRE-OPERATIVE DIAGNOSIS: Degenerative arthrosis of the left knee, primary  POST-OPERATIVE DIAGNOSIS:  Same  PROCEDURE:  Left total knee arthroplasty using computer-assisted navigation  SURGEON:  Jena Gauss. M.D.  ASSISTANT:  Gean Birchwood, PA-C (present and scrubbed throughout the case, critical for assistance with exposure, retraction, instrumentation, and closure)  ANESTHESIA: spinal  ESTIMATED BLOOD LOSS: 50 mL  FLUIDS REPLACED: 400 mL of crystalloid  TOURNIQUET TIME: 79 minutes  DRAINS: 2 medium Hemovac drains  SOFT TISSUE RELEASES: Anterior cruciate ligament, posterior cruciate ligament, deep medial collateral ligament, patellofemoral ligament  IMPLANTS UTILIZED: DePuy Attune size 5N posterior stabilized femoral component (cemented), size 4 rotating platform tibial component (cemented), 32 mm medialized dome patella (cemented), and a 5 mm stabilized rotating platform polyethylene insert.  INDICATIONS FOR SURGERY: Erin Stuart is a 58 y.o. year old female with a long history of progressive knee pain. X-rays demonstrated severe degenerative changes in tricompartmental fashion. The patient had not seen any significant improvement despite conservative nonsurgical intervention. After discussion of the risks and benefits of surgical intervention, the patient expressed understanding of the risks benefits and agree with plans for total knee arthroplasty.   The risks, benefits, and alternatives were discussed at length including but not limited to the risks of infection, bleeding, nerve injury, stiffness, blood clots, the need for revision surgery, cardiopulmonary complications, among others, and they were willing to proceed.  PROCEDURE IN DETAIL: The patient was brought into the operating room and, after adequate spinal anesthesia was achieved, a tourniquet was  placed on the patient's upper thigh. The patient's knee and leg were cleaned and prepped with alcohol and DuraPrep and draped in the usual sterile fashion. A "timeout" was performed as per usual protocol. The lower extremity was exsanguinated using an Esmarch, and the tourniquet was inflated to 300 mmHg. An anterior longitudinal incision was made followed by a standard mid vastus approach. The deep fibers of the medial collateral ligament were elevated in a subperiosteal fashion off of the medial flare of the tibia so as to maintain a continuous soft tissue sleeve. The patella was subluxed laterally and the patellofemoral ligament was incised. Inspection of the knee demonstrated severe degenerative changes with full-thickness loss of articular cartilage. Osteophytes were debrided using a rongeur. Anterior and posterior cruciate ligaments were excised. Two 4.0 mm Schanz pins were inserted in the femur and into the tibia for attachment of the array of trackers used for computer-assisted navigation. Hip center was identified using a circumduction technique. Distal landmarks were mapped using the computer. The distal femur and proximal tibia were mapped using the computer. The distal femoral cutting guide was positioned using computer-assisted navigation so as to achieve a 5 distal valgus cut. The femur was sized and it was felt that a size 5N femoral component was appropriate. A size 5 femoral cutting guide was positioned and the anterior cut was performed and verified using the computer. This was followed by completion of the posterior and chamfer cuts. Femoral cutting guide for the central box was then positioned in the center box cut was performed.  Attention was then directed to the proximal tibia. Medial and lateral menisci were excised. The extramedullary tibial cutting guide was positioned using computer-assisted navigation so as to achieve a 0 varus-valgus alignment and 3 posterior slope. The cut was  performed and verified using the computer. The proximal tibia  was sized and it was felt that a size 4 tibial tray was appropriate. Tibial and femoral trials were inserted followed by insertion of a 5 mm polyethylene insert. This allowed for excellent mediolateral soft tissue balancing both in flexion and in full extension. Finally, the patella was cut and prepared so as to accommodate a 32 mm medialized dome patella. A patella trial was placed and the knee was placed through a range of motion with excellent patellar tracking appreciated. The femoral trial was removed after debridement of posterior osteophytes. The central post-hole for the tibial component was reamed followed by insertion of a keel punch. Tibial trials were then removed. Cut surfaces of bone were irrigated with copious amounts of normal saline using pulsatile lavage and then suctioned dry. Polymethylmethacrylate cement with gentamicin was prepared in the usual fashion using a vacuum mixer. Cement was applied to the cut surface of the proximal tibia as well as along the undersurface of a size 4 rotating platform tibial component. Tibial component was positioned and impacted into place. Excess cement was removed using Personal assistant. Cement was then applied to the cut surfaces of the femur as well as along the posterior flanges of the size 5N femoral component. The femoral component was positioned and impacted into place. Excess cement was removed using Personal assistant. A 5 mm polyethylene trial was inserted and the knee was brought into full extension with steady axial compression applied. Finally, cement was applied to the backside of a 32 mm medialized dome patella and the patellar component was positioned and patellar clamp applied. Excess cement was removed using Personal assistant. After adequate curing of the cement, the tourniquet was deflated after a total tourniquet time of 79 minutes. Hemostasis was achieved using electrocautery. The knee was  irrigated with copious amounts of normal saline using pulsatile lavage followed by 450 ml of Surgiphor and then suctioned dry. 20 mL of 1.3% Exparel and 60 mL of 0.25% Marcaine in 40 mL of normal saline was injected along the posterior capsule, medial and lateral gutters, and along the arthrotomy site. A 5 mm stabilized rotating platform polyethylene insert was inserted and the knee was placed through a range of motion with excellent mediolateral soft tissue balancing appreciated and excellent patellar tracking noted. 2 medium drains were placed in the wound bed and brought out through separate stab incisions. The medial parapatellar portion of the incision was reapproximated using interrupted sutures of #1 Vicryl. Subcutaneous tissue was approximated in layers using first #0 Vicryl followed #2-0 Vicryl. The skin was approximated with skin staples. A sterile dressing was applied.  The patient tolerated the procedure well and was transported to the recovery room in stable condition.    Doniel Maiello P. Angie Fava., M.D.

## 2023-04-21 NOTE — Transfer of Care (Signed)
Immediate Anesthesia Transfer of Care Note  Patient: Erin Stuart  Procedure(s) Performed: COMPUTER ASSISTED TOTAL KNEE ARTHROPLASTY (Left: Knee)  Patient Location: PACU  Anesthesia Type:Spinal  Level of Consciousness: awake, alert , and oriented  Airway & Oxygen Therapy: Patient Spontanous Breathing and Patient connected to face mask oxygen  Post-op Assessment: Report given to RN and Post -op Vital signs reviewed and stable  Post vital signs: Reviewed and stable  Last Vitals:  Vitals Value Taken Time  BP 99/65 04/21/23 1517  Temp    Pulse 80 04/21/23 1520  Resp 19 04/21/23 1520  SpO2 99 % 04/21/23 1520  Vitals shown include unfiled device data.  Last Pain:  Vitals:   04/21/23 0942  PainSc: 2          Complications: There were no known notable events for this encounter.

## 2023-04-21 NOTE — Anesthesia Procedure Notes (Signed)
Spinal  Patient location during procedure: OR Start time: 04/21/2023 11:46 AM End time: 04/21/2023 11:48 AM Reason for block: surgical anesthesia Staffing Anesthesiologist: Piscitello, Cleda Mccreedy, MD Resident/CRNA: Elisabeth Pigeon, CRNA Other anesthesia staff: Benay Spice, RN Performed by: Elisabeth Pigeon, CRNA Authorized by: Rosaria Ferries, MD   Preanesthetic Checklist Completed: patient identified, IV checked, site marked, risks and benefits discussed, surgical consent, monitors and equipment checked, pre-op evaluation and timeout performed Spinal Block Patient position: sitting Prep: DuraPrep Patient monitoring: heart rate, cardiac monitor, continuous pulse ox and blood pressure Approach: midline Location: L3-4 Injection technique: single-shot Needle Needle type: Sprotte  Needle gauge: 24 G Needle length: 9 cm Assessment Sensory level: T4 Events: CSF return

## 2023-04-21 NOTE — Progress Notes (Signed)
 Patient is not able to walk the distance required to go the bathroom, or he/she is unable to safely negotiate stairs required to access the bathroom.  A 3in1 BSC will alleviate this problem   Raad Clayson P. Angie Fava M.D.

## 2023-04-21 NOTE — Progress Notes (Signed)
At around 2140, this RN heard pt's husband shouting for 'help' while exiting the medication room. This RN rushed in the pt's room and found pt on the bathroom floor on her knees face down. Staff immediately assisted back pt on the toilet bowl with BSC on it and noted she was pale, sweaty but answers to questions with eyes closed. Vital signs taken (please see flowsheet), ice cold washcloths were rendered on pt's face and forehead. No cut nor bleeding noted. Staff assisted pt back to bed via wheelchair and monitored closely. A 3.5 x 3cm hematoma was noted on the right forehead. On call Dr Joice Lofts was called and notified at 2158 with new orders made. Pt was given NS bolus to stabilize BP prior to head CT. Pt was given ice pack for her head, offered pain medicine but pt declined this time. Pt wheeled to CT per bed accompanied by staff after BP stabilized. Post Fall Flowsheet completed. Staff continues to monitor pt closely and attend to her needs.  Addendum: Pt was also referred to hospitalist per MD Poggi recommendation. Hospitalist Dr. Katrinka Blazing came to see pt with new orders made. Pt will be on bedrest for at least 6 hours. Pt had also X-rays taken on both of her knees.

## 2023-04-21 NOTE — Anesthesia Preprocedure Evaluation (Addendum)
Anesthesia Evaluation  Patient identified by MRN, date of birth, ID band Patient awake    Reviewed: Allergy & Precautions, NPO status , Patient's Chart, lab work & pertinent test results  History of Anesthesia Complications (+) PONV and history of anesthetic complications  Airway Mallampati: III  TM Distance: <3 FB Neck ROM: full    Dental  (+) Chipped   Pulmonary neg shortness of breath, asthma    Pulmonary exam normal        Cardiovascular Exercise Tolerance: Good hypertension, (-) angina +CHF  Normal cardiovascular exam+ dysrhythmias (LBBB) + Cardiac Defibrillator      Neuro/Psych  PSYCHIATRIC DISORDERS      negative neurological ROS     GI/Hepatic negative GI ROS, Neg liver ROS,neg GERD  ,,  Endo/Other  negative endocrine ROS    Renal/GU Renal disease     Musculoskeletal   Abdominal   Peds  Hematology negative hematology ROS (+)   Anesthesia Other Findings Patient has cardiac clearance for this procedure.   Past Medical History: 05/22/2021: Acquired dilation of ascending aorta and aortic root (HCC)     Comment:  a.) TTE 05/22/2021: Ao root 3.8 cm; asc Ao 3.5 cm; b.)               TTE 03/17/2023: Ao root 3.4 cm, asc Ao 3.6 cm No date: Anginal pain (HCC) No date: Anxiety     Comment:  a.) on BZO (alprazolam) PRN No date: Asthma No date: CHF (congestive heart failure) (HCC) No date: Degenerative arthritis of knee, bilateral No date: Depression No date: Dilated cardiomyopathy (HCC)     Comment:  a.) R/LHC 09/02/2016: EF <25%; b.) TTE 08/13/2016: EF               <15%; c.) MPI 08/14/2016: EF 21%; d.) cMRI 09/12/2016: EF              22%, no LGE; e.) s/p CRT-D placement 11/27/2016; f.) TTE               01/09/2017: EF 25%; g.) TTE 09/11/2017: EF 25-30%; h.)               TTE 10/09/2018: EF 35%; i.) TTE 09/10/2019: EF 45%; j.)               TTE 08/04/2020: EF 45%; k.) TTE 05/22/2021: EF 45%; l.)                TTE 03/17/2023: EF 50% No date: Diverticulosis No date: Essential hypertension No date: History of kidney stones No date: Hyperlipidemia No date: Hyperparathyroidism (HCC) 2012: LBBB (left bundle branch block) No date: Long-term use of aspirin therapy 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 Medtronic device 08/13/2016: Pulmonary HTN (HCC)     Comment:  a.) TTE 08/13/2017: RVSP 41.2; b.) TTE 01/09/2017: RVSP               36.9; c.) TTE 09/11/2017: RVSP 30.4; d.) 10/09/2018: RVSP              24.3 mmHg; e.) TTE 09/10/2019: RVSP 25.1 mmHg; f.) TTE               03/10/2022: RVSP 19.6; g.)  TTE 03/17/2023: RVSP 24 mmHg No date: Vitamin D deficiency  Past Surgical History: 11/27/2016: BI-VENTRICULAR IMPLANTABLE CARDIOVERTER DEFIBRILLATOR   (CRT-D); N/A     Comment:  Procedure: BI-VENTRICULAR IMPLANTABLE CARDIOVERTER               DEFIBRILLATOR  (CRT-D); Location: [facility]; Surgeon: Gerre Pebbles, MD 06/16/2020: COLONOSCOPY WITH PROPOFOL; N/A     Comment:  Procedure: COLONOSCOPY WITH PROPOFOL;  Surgeon: Wyline Mood, MD;  Location: [facility] ENDOSCOPY;  Service:               Gastroenterology;  Laterality: N/A; 01/01/2022: CYSTOSCOPY/URETEROSCOPY/HOLMIUM LASER/STENT PLACEMENT;  Left     Comment:  Procedure: CYSTOSCOPY/URETEROSCOPY/HOLMIUM LASER/STENT               PLACEMENT;  Surgeon: Riki Altes, MD;  Location:               [facility] ORS;  Service: Urology;  Laterality: Left; 11/29/2021: EXTRACORPOREAL SHOCK WAVE LITHOTRIPSY; Left     Comment:  procedure not done 03/25/2022: KNEE ARTHROPLASTY; Right     Comment:  Procedure: COMPUTER ASSISTED TOTAL KNEE ARTHROPLASTY -                RNFA;  Surgeon: Donato Heinz, MD;  Location: [facility] ORS;              Service: Orthopedics;  Laterality: Right; 09/16/2022: PARATHYROIDECTOMY; Right 08/27/2016: RIGHT/LEFT HEART CATH AND CORONARY ANGIOGRAPHY; Bilateral     Comment:  Procedure: Right/Left Heart Cath and Coronary               Angiography;  Surgeon: Dalia Heading, MD;  Location:               [facility] INVASIVE CV LAB;  Service: Cardiovascular;                Laterality: Bilateral; No date: WISDOM TOOTH EXTRACTION  BMI    Body Mass Index: 36.90 kg/m      Reproductive/Obstetrics negative OB ROS                             Anesthesia Physical Anesthesia Plan  ASA: 3  Anesthesia Plan: Spinal   Post-op Pain Management:    Induction:   PONV Risk Score and Plan:   Airway Management Planned: Natural Airway and Nasal Cannula  Additional Equipment:   Intra-op Plan:   Post-operative Plan:   Informed Consent: I have reviewed the patients History and Physical, chart, labs and discussed the procedure including the risks, benefits and alternatives for the proposed anesthesia with the patient or authorized representative who has indicated his/her understanding and acceptance.     Dental Advisory Given  Plan Discussed with: Anesthesiologist, CRNA and Surgeon  Anesthesia Plan Comments: (Patient reports no bleeding problems and no anticoagulant use.  Plan for spinal with backup GA  Patient consented for risks of anesthesia including but not limited to:  - adverse reactions to medications - damage to eyes, teeth, lips or other oral mucosa - nerve damage due to positioning  - risk of bleeding, infection and or nerve damage from spinal that could lead to paralysis - risk of headache or failed spinal - damage to teeth, lips or other oral mucosa - sore throat or hoarseness - damage to heart,  brain, nerves, lungs, other parts of body or loss of life  Patient voiced understanding  and assent.)       Anesthesia Quick Evaluation

## 2023-04-21 NOTE — Interval H&P Note (Signed)
History and Physical Interval Note:  04/21/2023 11:14 AM  Erin Stuart  has presented today for surgery, with the diagnosis of Primary osteoarthritis of left knee M17.12.  The various methods of treatment have been discussed with the patient and family. After consideration of risks, benefits and other options for treatment, the patient has consented to  Procedure(s): COMPUTER ASSISTED TOTAL KNEE ARTHROPLASTY (Left) as a surgical intervention.  The patient's history has been reviewed, patient examined, no change in status, stable for surgery.  I have reviewed the patient's chart and labs.  Questions were answered to the patient's satisfaction.     Mical Kicklighter P Nikeya Maxim

## 2023-04-22 ENCOUNTER — Observation Stay: Payer: 59

## 2023-04-22 ENCOUNTER — Encounter: Payer: Self-pay | Admitting: Orthopedic Surgery

## 2023-04-22 DIAGNOSIS — M1712 Unilateral primary osteoarthritis, left knee: Secondary | ICD-10-CM | POA: Diagnosis not present

## 2023-04-22 DIAGNOSIS — M25569 Pain in unspecified knee: Secondary | ICD-10-CM

## 2023-04-22 MED ORDER — ASPIRIN 81 MG PO TABS: 81.0000 mg | ORAL_TABLET | Freq: Two times a day (BID) | ORAL | Status: AC

## 2023-04-22 MED ORDER — SPIRONOLACTONE 25 MG PO TABS: 25.0000 mg | ORAL_TABLET | Freq: Every day | ORAL | Status: DC

## 2023-04-22 MED ORDER — TRAMADOL HCL 50 MG PO TABS: 50.0000 mg | ORAL_TABLET | ORAL | 0 refills | Status: AC | PRN

## 2023-04-22 MED ORDER — ORAL CARE MOUTH RINSE: 15.0000 mL | OROMUCOSAL | Status: DC | PRN

## 2023-04-22 MED ORDER — OXYCODONE HCL 5 MG PO TABS: 5.0000 mg | ORAL_TABLET | ORAL | 0 refills | Status: AC | PRN

## 2023-04-22 MED ORDER — CELECOXIB 200 MG PO CAPS: 200.0000 mg | ORAL_CAPSULE | Freq: Two times a day (BID) | ORAL | 1 refills | Status: AC

## 2023-04-22 NOTE — Progress Notes (Signed)
   04/21/23 2200  What Happened  Was fall witnessed? No  Was patient injured? Yes  Patient found in bathroom  Found by Staff-comment  Stated prior activity other (comment) (on toliet)  Provider Notification  Provider Name/Title dr edie  Date Provider Notified 04/21/23  Time Provider Notified 2200  Method of Notification Call  Notification Reason Fall  Provider response See new orders  Date of Provider Response 04/21/23  Time of Provider Response 2200  Follow Up  Family notified Yes - comment  Time family notified 2200  Additional tests Yes-comment (CT scan, IVF 250 bolus)  Simple treatment Ice  Progress note created (see row info) Yes  Adult Fall Risk Assessment  Risk Factor Category (scoring not indicated) High fall risk per protocol (document High fall risk)  Patient Fall Risk Level High fall risk  Adult Fall Risk Interventions  Required Bundle Interventions *See Row Information* High fall risk - low, moderate, and high requirements implemented  Additional Interventions Use of appropriate toileting equipment (bedpan, BSC, etc.);Room near nurses station;Family Supervision  Vitals  BP (!) 80/53  Pulse Rate 72  Oxygen Therapy  SpO2 92 %  Pain Assessment  Pain Scale 0-10  Pain Score 3  Pain Type Acute pain  Pain Location Head  Pain Orientation Anterior  Pain Descriptors / Indicators Headache  Pain Onset On-going  Pain Intervention(s) Hot/Cold interventions (ice pack applied on head)  Hot/Cold Interventions  Hot/Cold Interventions Ice Pack  Neurological  Neuro (WDL) WDL  Level of Consciousness Alert  Orientation Level Oriented X4  Musculoskeletal  Musculoskeletal (WDL) X  Assistive Device BSC;Front wheel walker  Weight Bearing Restrictions Per Provider Order Yes  LLE Weight Bearing Per Provider Order WBAT  Musculoskeletal Details  LLE Limited movement;Surgery  Left Knee Limited movement;Surgery

## 2023-04-22 NOTE — Discharge Summary (Signed)
 Physician Discharge Summary  Subjective: 1 Day Post-Op Procedure(s) (LRB): COMPUTER ASSISTED TOTAL KNEE ARTHROPLASTY (Left) Patient reports pain as mild.   Patient seen in rounds with Dr. Mardee. Patient is doing well overall this morning.  Patient did have a reported syncopal event and fall last night landing on her knees and hitting her head.  Workup thus far has been negative for any intracranial or cervical abnormality.  No periprosthetic or soft tissue issue noted.  Denies any CP, SOB, N/B, fevers or chills. Patient is ready to go home  Physician Discharge Summary  Patient ID: Erin Stuart MRN: [id number] DOB/AGE: [birth year] 58 y.o.  Admit date: 04/21/2023 Discharge date: 04/22/2023  Admission Diagnoses:  Discharge Diagnoses:  Principal Problem:   History of total knee arthroplasty, left Active Problems:   Hyperlipidemia   Cardiomyopathy, dilated (HCC)   Syncope and collapse   Knee pain   Discharged Condition: fair  Hospital Course: Patient presented to the hospital on 04/21/2023 for an elective left total knee arthroplasty performed by Dr. Mardee. Patient was given 1g of TXA and 2g of Ancef  prior to the procedure. she tolerated the procedure well without any complications. See procedural note below for details. Postoperatively, the patient did okay.  When attempting to get up to the commode and sit to, the patient reported having a syncopal event, falling landing on her knees and hitting her head.  CT scan of the head neck resulted were negative.  X-rays of her knees were negative.  Physical exam was unremarkable.  Patient was ease back into physical therapy based interventions.  she was able to pass PT protocols on post-op day one without any replicable symptoms or dizziness. JP drain was removed without any difficulty and was intact. she was able to void her bladder without any difficulty. Physical exam was unremarkable. she denies any SOB, CP, N/V, fevers or chills. Vital signs  are stable, blood pressure has come back up. Patient is stable to discharge home.  PROCEDURE:  Left total knee arthroplasty using computer-assisted navigation   SURGEON:  Lynwood SHAUNNA Mardee Mickey. M.D.   ASSISTANT:  Sidra Koyanagi, PA-C (present and scrubbed throughout the case, critical for assistance with exposure, retraction, instrumentation, and closure)   ANESTHESIA: spinal   ESTIMATED BLOOD LOSS: 50 mL   FLUIDS REPLACED: 400 mL of crystalloid   TOURNIQUET TIME: 79 minutes   DRAINS: 2 medium Hemovac drains   SOFT TISSUE RELEASES: Anterior cruciate ligament, posterior cruciate ligament, deep medial collateral ligament, patellofemoral ligament   IMPLANTS UTILIZED: DePuy Attune size 5N posterior stabilized femoral component (cemented), size 4 rotating platform tibial component (cemented), 32 mm medialized dome patella (cemented), and a 5 mm stabilized rotating platform polyethylene insert.  Treatments: Patient has been given a short bolus of fluids, CT of head and x-rays of the knees were performed, found to be within normal limits.  Discharge Exam: Blood pressure 120/70, pulse 69, temperature 98 F (36.7 C), temperature source Oral, resp. rate 16, height 5' 4 (1.626 m), weight 100.2 kg, last menstrual period 05/27/2016, SpO2 94%.   Disposition: Discharge disposition: 01-Home or Self Care        Allergies as of 04/22/2023       Reactions   Sulfa  Antibiotics Nausea And Vomiting   Pt states it felt like she had the flu.   Bactrim  [sulfamethoxazole -trimethoprim ] Nausea And Vomiting        Medication List     TAKE these medications    ALPRAZolam  1 MG tablet Commonly  known as: Xanax  Take 1 tablet (1 mg total) by mouth 3 (three) times daily as needed for anxiety. What changed: when to take this   amoxicillin 500 MG capsule Commonly known as: AMOXIL Take 2,000 mg by mouth See admin instructions. Take 2000 mg 1 hour prior to dental work   aspirin  81 MG tablet Take 1  tablet (81 mg total) by mouth in the morning and at bedtime. Reported on 03/02/2015 What changed: when to take this   AZO CRANBERRY URINARY TRACT PO Take 2 tablets by mouth daily.   B-12 2500 MCG Tabs Take 2,500 mcg by mouth daily.   carvedilol  6.25 MG tablet Commonly known as: COREG  Take 6.25 mg by mouth 2 (two) times daily.   celecoxib  200 MG capsule Commonly known as: CELEBREX  Take 1 capsule (200 mg total) by mouth 2 (two) times daily.   D3 + K2 PO Take 1 capsule by mouth daily.   furosemide  20 MG tablet Commonly known as: LASIX  Take 20 mg by mouth daily.   ibuprofen 200 MG tablet Commonly known as: ADVIL Take 800 mg by mouth daily as needed for moderate pain (pain score 4-6).   ipratropium 0.03 % nasal spray Commonly known as: ATROVENT  Place 2 sprays into both nostrils 3 (three) times daily as needed.   losartan  25 MG tablet Commonly known as: COZAAR  Take 25 mg by mouth at bedtime.   Magnesium  250 MG Tabs Take 1 tablet by mouth daily.   Mucinex Sinus-Max Sinus/Allrgy 0.05 % nasal spray Generic drug: oxymetazoline Place 1 spray into both nostrils daily as needed for congestion.   oxyCODONE  5 MG immediate release tablet Commonly known as: Oxy IR/ROXICODONE  Take 1 tablet (5 mg total) by mouth every 4 (four) hours as needed for moderate pain (pain score 4-6) (pain score 4-6).   rosuvastatin  5 MG tablet Commonly known as: CRESTOR  Take 5 mg by mouth at bedtime.   senna-docusate 8.6-50 MG tablet Commonly known as: Senokot-S Take 1 tablet by mouth daily as needed for mild constipation.   spironolactone  25 MG tablet Commonly known as: ALDACTONE  Take 25 mg by mouth daily.   traMADol  50 MG tablet Commonly known as: ULTRAM  Take 1-2 tablets (50-100 mg total) by mouth every 4 (four) hours as needed for moderate pain (pain score 4-6).   venlafaxine  XR 75 MG 24 hr capsule Commonly known as: EFFEXOR -XR Take 75 mg by mouth at bedtime.   vitamin C 1000 MG  tablet Take 1,000 mg by mouth daily.   Vitamin D  (Ergocalciferol ) 1.25 MG (50000 UNIT) Caps capsule Commonly known as: DRISDOL  Take 50,000 Units by mouth every 7 (seven) days.               Durable Medical Equipment  (From admission, onward)           Start     Ordered   04/21/23 1650  DME Walker rolling  Once       Question:  Patient needs a walker to treat with the following condition  Answer:  Total knee replacement status   04/21/23 1649   04/21/23 1650  DME Bedside commode  Once       Comments: Patient is not able to walk the distance required to go the bathroom, or he/she is unable to safely negotiate stairs required to access the bathroom.  A 3in1 BSC will alleviate this problem  Question:  Patient needs a bedside commode to treat with the following condition  Answer:  Total knee replacement  status   04/21/23 1649            Follow-up Information     Drake Chew, PA-C Follow up on 05/06/2023.   Specialty: Orthopedic Surgery Why: at 1:15pm Contact information: [address] [place] [address] [phone]         Mardee Lynwood SQUIBB, MD Follow up on 06/03/2023.   Specialty: Orthopedic Surgery Why: at 3:00pm Contact information: [address] [facility] [place] [address] [phone]                 Signed: Sidra Drake 04/22/2023, 9:01 AM   Objective: Vital signs in last 24 hours: Temp:  [97.2 F (36.2 C)-98.3 F (36.8 C)] 98 F (36.7 C) (02/04 0804) Pulse Rate:  [60-95] 69 (02/04 0804) Resp:  [10-18] 16 (02/04 0804) BP: (77-127)/(47-86) 120/70 (02/04 0804) SpO2:  [90 %-98 %] 94 % (02/04 0804) Weight:  [97.5 kg-100.2 kg] 100.2 kg (02/04 0500)  Intake/Output from previous day:  Intake/Output Summary (Last 24 hours) at 04/22/2023 0901 Last data filed at 04/22/2023 0300 Gross per 24 hour  Intake 1652.66 ml  Output 930 ml  Net 722.66 ml    Intake/Output this shift: No intake/output data  recorded.  Labs: No results for input(s): HGB in the last 72 hours. No results for input(s): WBC, RBC, HCT, PLT in the last 72 hours. No results for input(s): NA, K, CL, CO2, BUN, CREATININE, GLUCOSE, CALCIUM  in the last 72 hours. No results for input(s): LABPT, INR in the last 72 hours.  EXAM: General - Patient is Alert, Appropriate, and Oriented Extremity - Neurologically intact ABD soft Neurovascular intact Sensation intact distally Intact pulses distally Dorsiflexion/Plantar flexion intact No cellulitis present Compartment soft Stable to varus and valgus stressing on both the left and right knee.  No noted posterior sag. Dressing - dressing C/D/I and no drainage Motor Function - intact, moving foot and toes well on exam. JP Drain pulled without difficulty. Intact  Assessment/Plan: 1 Day Post-Op Procedure(s) (LRB): COMPUTER ASSISTED TOTAL KNEE ARTHROPLASTY (Left) Procedure(s) (LRB): COMPUTER ASSISTED TOTAL KNEE ARTHROPLASTY (Left) Past Medical History:  Diagnosis Date   Acquired dilation of ascending aorta and aortic root (HCC) 05/22/2021   a.) TTE 05/22/2021: Ao root 3.8 cm; asc Ao 3.5 cm; b.) TTE 03/17/2023: Ao root 3.4 cm, asc Ao 3.6 cm   Anginal pain (HCC)    Anxiety    a.) on BZO (alprazolam ) PRN   Asthma    CHF (congestive heart failure) (HCC)    Degenerative arthritis of knee, bilateral    Depression    Dilated cardiomyopathy (HCC)    a.) R/LHC 09/02/2016: EF <25%; b.) TTE 08/13/2016: EF <15%; c.) MPI 08/14/2016: EF 21%; d.) cMRI 09/12/2016: EF 22%, no LGE; e.) s/p CRT-D placement 11/27/2016; f.) TTE 01/09/2017: EF 25%; g.) TTE 09/11/2017: EF 25-30%; h.) TTE 10/09/2018: EF 35%; i.) TTE 09/10/2019: EF 45%; j.) TTE 08/04/2020: EF 45%; k.) TTE 05/22/2021: EF 45%; l.) TTE 03/17/2023: EF 50%   Diverticulosis    Essential hypertension    History of kidney stones    Hyperlipidemia    Hyperparathyroidism (HCC)    LBBB (left bundle branch  block) 2012   Long-term use of aspirin  therapy    Myocarditis, viral    Palpitations    Parathyroid adenoma 09/2022   a.) s/p parathyroidectomy 09/2022   PONV (postoperative nausea and vomiting)    a.) single remote episode   Pre-diabetes    Presence of cardiac resynchronization  therapy defibrillator (CRT-D) 11/27/2016   a. Medtronic device   Pulmonary HTN (HCC) 08/13/2016   a.) TTE 08/13/2017: RVSP 41.2; b.) TTE 01/09/2017: RVSP 36.9; c.) TTE 09/11/2017: RVSP 30.4; d.) 10/09/2018: RVSP 24.3 mmHg; e.) TTE 09/10/2019: RVSP 25.1 mmHg; f.) TTE 03/10/2022: RVSP 19.6; g.) TTE 03/17/2023: RVSP 24 mmHg   Vitamin D  deficiency    Principal Problem:   History of total knee arthroplasty, left Active Problems:   Hyperlipidemia   Cardiomyopathy, dilated (HCC)   Syncope and collapse   Knee pain  Estimated body mass index is 37.92 kg/m as calculated from the following:   Height as of this encounter: 5' 4 (1.626 m).   Weight as of this encounter: 100.2 kg. Advance diet Up with therapy  Patient experienced a syncopal event with noted injury to the head and knees.  X-rays of the patient's bilateral knees and a CT of the head and neck were ordered last night to evaluate for any periprosthetic issue or intracranial abnormality.  Were all found to be negative and were independently reviewed by myself.   We will continue with physical therapy today.  Orthostatic vitals have been ordered.  Furosemide  held   Patient will continue to work with physical therapy to pass postoperative PT protocols, ROM and strengthening   Discussed with the patient continuing to utilize [employer]   Patient will use bone foam in 20-30 minute intervals   Patient will wear TED hose bilaterally to help prevent DVT and clot formation   Discussed the Aquacel bandage.  This bandage will stay in place 7 days postoperatively.  Can be replaced with honeycomb bandages that will be sent home with the patient   Discussed  sending the patient home with tramadol  and oxycodone  for as needed pain management.  Patient will also be sent home with Celebrex  to help with swelling and inflammation.  Patient will take an 81 mg aspirin  twice daily for DVT prophylaxis   JP drain removed without difficulty, intact   Weight-Bearing as tolerated to left leg   Patient will follow-up with [facility] clinic orthopedics in 2 weeks for staple removal and reevaluation  Diet - Cardiac diet Follow up - in 2 weeks Activity - WBAT Disposition - Home Condition Upon Discharge - Good DVT Prophylaxis - Aspirin  and TED hose  Fonda CHARLENA Koyanagi, PA-C Orthopaedic Surgery 04/22/2023, 9:01 AM

## 2023-04-22 NOTE — Progress Notes (Signed)
 DISCHARGE NOTE:  Pt is given dc instructions and voices no concerns or questions at this time. Pt is also given 2 honeycomb dressings, both TED hose on and in place, as well as medication scripts given. Pt's IV removed and wheeled down to medical mall entrance. Pt's husband provided transportation.

## 2023-04-22 NOTE — Progress Notes (Signed)
Subjective: 1 Day Post-Op Procedure(s) (LRB): COMPUTER ASSISTED TOTAL KNEE ARTHROPLASTY (Left) Patient reports pain as mild.   Patient seen in rounds with Dr. Ernest Pine. Patient is well, and has had no acute complaints or problems.  Denies any CP, SOB, N/B, fevers or chills.  Does admit to having a syncopal event where she fell, landed on her knees and hit her head last night.  Workup thus far has been negative We will continue with therapy today Plan is to go Home after hospital stay.  Objective: Vital signs in last 24 hours: Temp:  [97.2 F (36.2 C)-98.3 F (36.8 C)] 98 F (36.7 C) (02/04 0804) Pulse Rate:  [60-95] 69 (02/04 0804) Resp:  [10-18] 16 (02/04 0804) BP: (77-127)/(47-86) 120/70 (02/04 0804) SpO2:  [90 %-98 %] 94 % (02/04 0804) Weight:  [97.5 kg-100.2 kg] 100.2 kg (02/04 0500)  Intake/Output from previous day:  Intake/Output Summary (Last 24 hours) at 04/22/2023 0855 Last data filed at 04/22/2023 0300 Gross per 24 hour  Intake 1652.66 ml  Output 930 ml  Net 722.66 ml    Intake/Output this shift: No intake/output data recorded.  Labs: No results for input(s): "HGB" in the last 72 hours. No results for input(s): "WBC", "RBC", "HCT", "PLT" in the last 72 hours. No results for input(s): "NA", "K", "CL", "CO2", "BUN", "CREATININE", "GLUCOSE", "CALCIUM" in the last 72 hours. No results for input(s): "LABPT", "INR" in the last 72 hours.  EXAM General - Patient is Alert, Appropriate, and Oriented Extremity - Neurologically intact ABD soft Neurovascular intact Sensation intact distally Intact pulses distally Dorsiflexion/Plantar flexion intact No cellulitis present Compartment soft Stable to varus and valgus stressing on both the left and right knee.  No noted posterior sag. Dressing - dressing C/D/I and no drainage Motor Function - intact, moving foot and toes well on exam. JP Drain pulled without difficulty. Intact  Past Medical History:  Diagnosis Date    Acquired dilation of ascending aorta and aortic root (HCC) 05/22/2021   a.) TTE 05/22/2021: Ao root 3.8 cm; asc Ao 3.5 cm; b.) TTE 03/17/2023: Ao root 3.4 cm, asc Ao 3.6 cm   Anginal pain (HCC)    Anxiety    a.) on BZO (alprazolam) PRN   Asthma    CHF (congestive heart failure) (HCC)    Degenerative arthritis of knee, bilateral    Depression    Dilated cardiomyopathy (HCC)    a.) R/LHC 09/02/2016: EF <25%; b.) TTE 08/13/2016: EF <15%; c.) MPI 08/14/2016: EF 21%; d.) cMRI 09/12/2016: EF 22%, no LGE; e.) s/p CRT-D placement 11/27/2016; f.) TTE 01/09/2017: EF 25%; g.) TTE 09/11/2017: EF 25-30%; h.) TTE 10/09/2018: EF 35%; i.) TTE 09/10/2019: EF 45%; j.) TTE 08/04/2020: EF 45%; k.) TTE 05/22/2021: EF 45%; l.) TTE 03/17/2023: EF 50%   Diverticulosis    Essential hypertension    History of kidney stones    Hyperlipidemia    Hyperparathyroidism (HCC)    LBBB (left bundle branch block) 2012   Long-term use of aspirin therapy    Myocarditis, viral    Palpitations    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 Medtronic device   Pulmonary HTN (HCC) 08/13/2016   a.) TTE 08/13/2017: RVSP 41.2; b.) TTE 01/09/2017: RVSP 36.9; c.) TTE 09/11/2017: RVSP 30.4; d.) 10/09/2018: RVSP 24.3 mmHg; e.) TTE 09/10/2019:  RVSP 25.1 mmHg; f.) TTE 03/10/2022: RVSP 19.6; g.) TTE 03/17/2023: RVSP 24 mmHg   Vitamin D deficiency     Assessment/Plan: 1 Day Post-Op Procedure(s) (LRB): COMPUTER ASSISTED TOTAL KNEE ARTHROPLASTY (Left) Principal Problem:   History of total knee arthroplasty, left Active Problems:   Hyperlipidemia   Cardiomyopathy, dilated (HCC)   Syncope and collapse   Knee pain  Estimated body mass index is 37.92 kg/m as calculated from the following:   Height as of this encounter: 5\' 4"  (1.626 m).    Weight as of this encounter: 100.2 kg. Advance diet Up with therapy  Patient experienced a syncopal event with noted injury to the head and knees.  X-rays of the patient's bilateral knees and a CT of the head and neck were ordered last night to evaluate for any periprosthetic issue or intracranial abnormality.  Were all found to be negative and were independently reviewed by myself.  We will continue with physical therapy today.  Orthostatic vitals have been ordered.  Furosemide held  Patient will continue to work with physical therapy to pass postoperative PT protocols, ROM and strengthening  Discussed with the patient continuing to utilize Polar Care  Patient will use bone foam in 20-30 minute intervals  Patient will wear TED hose bilaterally to help prevent DVT and clot formation  Discussed the Aquacel bandage.  This bandage will stay in place 7 days postoperatively.  Can be replaced with honeycomb bandages that will be sent home with the patient  Discussed sending the patient home with tramadol and oxycodone for as needed pain management.  Patient will also be sent home with Celebrex to help with swelling and inflammation.  Patient will take an 81 mg aspirin twice daily for DVT prophylaxis  JP drain removed without difficulty, intact  Weight-Bearing as tolerated to left leg  Patient will follow-up with [facility] in 2 weeks for staple removal and reevaluation  Rayburn Go, PA-C [facility] 04/22/2023, 8:55 AM

## 2023-04-22 NOTE — Evaluation (Signed)
 Physical Therapy Evaluation Patient Details Name: Erin Stuart MRN: [id number] DOB: [birth year] Today's Date: 04/22/2023  History of Present Illness  Pt admitted for L TKR and is POD 1 at time of evaluation. Stay complicated by post op fall, cleared medically to participate. History includes R TKR in 2024, depression, HTN, pulm HTN, and pacemaker.  Clinical Impression  Pt is a pleasant 58 year old female who was admitted for L TKR. Pt demonstrates ability to perform 10 SLRs with independence, therefore does not require KI for mobility. Pt demonstrates all bed mobility/transfers/ambulation with cga and RW. Pt has met requirements for dc from hospital PT and is excited to start the next phase of her recovery at home.  RN aware. Will dc current orders.          If plan is discharge home, recommend the following: A little help with walking and/or transfers;Help with stairs or ramp for entrance   Can travel by private vehicle        Equipment Recommendations None recommended by PT  Recommendations for Other Services       Functional Status Assessment Patient has had a recent decline in their functional status and demonstrates the ability to make significant improvements in function in a reasonable and predictable amount of time.     Precautions / Restrictions Precautions Precautions: Fall;Knee Precaution Booklet Issued: Yes (comment) Restrictions Weight Bearing Restrictions Per Provider Order: Yes LLE Weight Bearing Per Provider Order: Weight bearing as tolerated      Mobility  Bed Mobility               General bed mobility comments: NT, received seated at EOB    Transfers Overall transfer level: Needs assistance Equipment used: Rolling walker (2 wheels) Transfers: Sit to/from Stand Sit to Stand: Contact guard assist           General transfer comment: safe technique with ability to safely stand. No dizziness noted    Ambulation/Gait Ambulation/Gait  assistance: Contact guard assist Gait Distance (Feet): 250 Feet Assistive device: Rolling walker (2 wheels) Gait Pattern/deviations: Step-through pattern       General Gait Details: cues for reciprocal gait pattern and rolling walker on ground as she tends to pick it up.  Stairs Stairs: Yes Stairs assistance: Contact guard assist Stair Management: One rail Left, Step to pattern Number of Stairs: 4 General stair comments: up/down with safe technique and step to gait pattern. 2 hand support on railings  Wheelchair Mobility     Tilt Bed    Modified Rankin (Stroke Patients Only)       Balance Overall balance assessment: Needs assistance Sitting-balance support: Feet supported Sitting balance-Leahy Scale: Good     Standing balance support: Bilateral upper extremity supported Standing balance-Leahy Scale: Good                               Pertinent Vitals/Pain Pain Assessment Pain Assessment: 0-10 Pain Score: 4  Pain Location: L knee Pain Descriptors / Indicators: Operative site guarding Pain Intervention(s): Limited activity within patient's tolerance, Premedicated before session, Repositioned, Ice applied    Home Living Family/patient expects to be discharged to:: Private residence Living Arrangements: Spouse/significant other;Children Available Help at Discharge: Family Type of Home: House Home Access: Stairs to enter Entrance Stairs-Rails: Left Entrance Stairs-Number of Steps: 2   Home Layout: Two level;Able to live on main level with bedroom/bathroom Home Equipment: Rolling Walker (2 wheels);BSC/3in1;Grab bars -  tub/shower      Prior Function Prior Level of Function : Independent/Modified Independent             Mobility Comments: driving, reports no previous falls ADLs Comments: indep prior     Extremity/Trunk Assessment   Upper Extremity Assessment Upper Extremity Assessment: Overall WFL for tasks assessed    Lower Extremity  Assessment Lower Extremity Assessment: Overall WFL for tasks assessed       Communication   Communication Communication: No apparent difficulties  Cognition Arousal: Alert Behavior During Therapy: WFL for tasks assessed/performed Overall Cognitive Status: Within Functional Limits for tasks assessed                                 General Comments: very pleasant and agreeable to session        General Comments      Exercises Total Joint Exercises Goniometric ROM: L knee AROM: 0-100 degrees Other Exercises Other Exercises: reviewed written HEP and answered all questions regarding polar care, home PT expectations   Assessment/Plan    PT Assessment All further PT needs can be met in the next venue of care  PT Problem List         PT Treatment Interventions      PT Goals (Current goals can be found in the Care Plan section)  Acute Rehab PT Goals Patient Stated Goal: to go home PT Goal Formulation: All assessment and education complete, DC therapy Time For Goal Achievement: 04/22/23 Potential to Achieve Goals: Good    Frequency       Co-evaluation               AM-PAC PT 6 Clicks Mobility  Outcome Measure Help needed turning from your back to your side while in a flat bed without using bedrails?: None Help needed moving from lying on your back to sitting on the side of a flat bed without using bedrails?: None Help needed moving to and from a bed to a chair (including a wheelchair)?: None Help needed standing up from a chair using your arms (e.g., wheelchair or bedside chair)?: None Help needed to walk in hospital room?: None Help needed climbing 3-5 steps with a railing? : A Little 6 Click Score: 23    End of Session Equipment Utilized During Treatment: Gait belt Activity Tolerance: Patient tolerated treatment well Patient left: in bed;with bed alarm set Nurse Communication: Mobility status PT Visit Diagnosis: Muscle weakness  (generalized) (M62.81);Pain Pain - Right/Left: Left Pain - part of body: Knee    Time: 9062-8988 PT Time Calculation (min) (ACUTE ONLY): 34 min   Charges:   PT Evaluation $PT Eval Low Complexity: 1 Low PT Treatments $Gait Training: 8-22 mins PT General Charges $$ ACUTE PT VISIT: 1 Visit         Corean Dade, PT, DPT, GCS [phone]   Jamiyah Dingley 04/22/2023, 11:25 AM

## 2023-04-22 NOTE — Evaluation (Signed)
 Occupational Therapy Evaluation Patient Details Name: Erin Stuart MRN: [id number] DOB: 05-11-1965 Today's Date: 04/22/2023   History of Present Illness Pt admitted for L TKR and is POD 1 at time of evaluation. Stay complicated by post op fall, cleared medically to participate. History includes R TKR in 2024, depression, HTN, pulm HTN, and pacemaker.   Clinical Impression   Erin Stuart was seen for OT evaluation this date, POD#1 from above surgery. Pt was independent in all ADLs prior to surgery. Pt is eager to return to PLOF with less pain and improved safety and independence. Pt currently requires minimal assist for LB dressing while in seated position due to pain and limited AROM of L knee. Pt instructed in polar care mgt, falls prevention strategies, home/routines modifications, DME/AE for LB bathing and dressing tasks, and compression stocking mgt. Pt would benefit from skilled OT services including additional instruction in dressing techniques with or without assistive devices for dressing and bathing skills to support recall and carryover prior to discharge and ultimately to maximize safety, independence, and minimize falls risk and caregiver burden. Do not currently anticipate any OT needs following this hospitalization.          If plan is discharge home, recommend the following: A little help with walking and/or transfers;A little help with bathing/dressing/bathroom;Help with stairs or ramp for entrance;Assist for transportation;Assistance with cooking/housework    Functional Status Assessment  Patient has had a recent decline in their functional status and demonstrates the ability to make significant improvements in function in a reasonable and predictable amount of time.  Equipment Recommendations  BSC/3in1    Recommendations for Other Services       Precautions / Restrictions Precautions Precautions: Fall;Knee Precaution Booklet Issued: Yes (comment) Restrictions Weight  Bearing Restrictions Per Provider Order: Yes LLE Weight Bearing Per Provider Order: Weight bearing as tolerated      Mobility Bed Mobility Overal bed mobility: Needs Assistance Bed Mobility: Supine to Sit     Supine to sit: Supervision, HOB elevated, Used rails          Transfers Overall transfer level: Needs assistance Equipment used: Rolling walker (2 wheels) Transfers: Sit to/from Stand, Bed to chair/wheelchair/BSC Sit to Stand: Contact guard assist     Step pivot transfers: Contact guard assist, Supervision            Balance Overall balance assessment: Needs assistance Sitting-balance support: Feet supported Sitting balance-Leahy Scale: Normal Sitting balance - Comments: steady with static/dynamic sitting balance at EOB   Standing balance support: During functional activity, Reliant on assistive device for balance, Single extremity supported, Bilateral upper extremity supported Standing balance-Leahy Scale: Good Standing balance comment: Generally maintains 1 UE support during standing ADL management.                           ADL either performed or assessed with clinical judgement   ADL Overall ADL's : Needs assistance/impaired                                       General ADL Comments: Supervision with min cueing for technique during bed/functional mobility and BSC transfer. Initial instability on standing, but improves as session progresses. Pt able to manage peri-care from STS with MOD I. Denies any adverse signs/symptoms during positional changes/functional mobility this date. Time taken to assess orthostatic vitals during session which were  negative and pt remained a symptomatic t/o.     Vision Patient Visual Report: No change from baseline       Perception         Praxis         Pertinent Vitals/Pain Pain Assessment Pain Assessment: No/denies pain Pain Score: 3  Pain Location: L knee Pain Descriptors /  Indicators: Operative site guarding Pain Intervention(s): Limited activity within patient's tolerance, Monitored during session, Repositioned, Ice applied     Extremity/Trunk Assessment Upper Extremity Assessment Upper Extremity Assessment: Overall WFL for tasks assessed   Lower Extremity Assessment Lower Extremity Assessment: Overall WFL for tasks assessed;LLE deficits/detail;Defer to PT evaluation LLE Deficits / Details: s/p L TKA WBAT   Cervical / Trunk Assessment Cervical / Trunk Assessment: Normal   Communication Communication Communication: No apparent difficulties   Cognition Arousal: Alert Behavior During Therapy: WFL for tasks assessed/performed Overall Cognitive Status: Within Functional Limits for tasks assessed                                 General Comments: very pleasant and agreeable to session     General Comments       Exercises Other Exercises Other Exercises: Reviewed polar care management, safe transfer technique/falls prevention. Pt with good recall/carryover from prior knee sx. Educated on importance of use of BSC for toilet transfers during initial days after sx.   Shoulder Instructions      Home Living Family/patient expects to be discharged to:: Private residence Living Arrangements: Spouse/significant other;Children Available Help at Discharge: Family Type of Home: House Home Access: Stairs to enter [employer] of Steps: 2 Entrance Stairs-Rails: Left Home Layout: Two level;Able to live on main level with bedroom/bathroom     Bathroom Shower/Tub: Tub/shower unit         Home Equipment: [employer] (2 wheels);BSC/3in1;Grab bars - tub/shower          Prior Functioning/Environment Prior Level of Function : Independent/Modified Independent             Mobility Comments: driving, reports no previous falls ADLs Comments: Active, independent. Recovered well from prior OT session.        OT Problem  List: Decreased range of motion;Decreased knowledge of use of DME or AE;Impaired balance (sitting and/or standing);Decreased coordination      OT Treatment/Interventions: Therapeutic exercise;Self-care/ADL training;Therapeutic activities;DME and/or AE instruction;Balance training;Patient/family education;Energy conservation    OT Goals(Current goals can be found in the care plan section) Acute Rehab OT Goals Patient Stated Goal: To go home OT Goal Formulation: With patient Time For Goal Achievement: 05/06/23 Potential to Achieve Goals: Good ADL Goals Pt Will Perform Lower Body Dressing: sit to/from stand;with modified independence;with adaptive equipment Pt Will Transfer to Toilet: ambulating;bedside commode;with modified independence Pt Will Perform Toileting - Clothing Manipulation and hygiene: sit to/from stand;with modified independence;with adaptive equipment  OT Frequency: Min 1X/week    Co-evaluation              AM-PAC OT 6 Clicks Daily Activity     Outcome Measure Help from another person eating meals?: None Help from another person taking care of personal grooming?: None Help from another person toileting, which includes using toliet, bedpan, or urinal?: A Little Help from another person bathing (including washing, rinsing, drying)?: A Little Help from another person to put on and taking off regular upper body clothing?: None Help from another person to put on and  taking off regular lower body clothing?: A Little 6 Click Score: 21   End of Session Equipment Utilized During Treatment: Gait belt;Rolling walker (2 wheels) Nurse Communication: Mobility status;Other (comment) (orthostatic vitals negative)  Activity Tolerance: Patient tolerated treatment well Patient left: in bed;with bed alarm set;with call bell/phone within reach;with family/visitor present  OT Visit Diagnosis: Other abnormalities of gait and mobility (R26.89);Muscle weakness (generalized)  (M62.81);Pain Pain - Right/Left: Left Pain - part of body: Knee                Time: 9093-9060 OT Time Calculation (min): 33 min Charges:  OT General Charges $OT Visit: 1 Visit OT Evaluation $OT Eval Low Complexity: 1 Low OT Treatments $Self Care/Home Management : 8-22 mins  Jhonny Pelton, M.S., OTR/L 04/22/23, 11:53 AM

## 2023-04-22 NOTE — TOC Initial Note (Addendum)
 Transition of Care [facility]) - Initial/Assessment Note    Patient Details  Name: Erin Stuart MRN: [id number] Date of Birth: 1965-05-26  [facility] CM/SW Contact:    Royanne JINNY Bernheim, RN Phone Number: 04/22/2023, 10:55 AM  Clinical Narrative:                                                     [place] Is set up for [facility] prior to surgery by Surgeons office   Has DME at home from previous surgery     Patient Goals and CMS Choice            Expected Discharge Plan and Services         Expected Discharge Date: 04/22/23                                    Prior Living Arrangements/Services                       Activities of Daily Living   ADL Screening (condition at time of admission) Independently performs ADLs?: Yes (appropriate for developmental age) Is the patient deaf or have difficulty hearing?: No Does the patient have difficulty seeing, even when wearing glasses/contacts?: No Does the patient have difficulty concentrating, remembering, or making decisions?: No  Permission Sought/Granted                  Emotional Assessment              Admission diagnosis:  History of total knee arthroplasty, left [Z96.652] Patient Active Problem List   Diagnosis Date Noted   Knee pain 04/22/2023   History of total knee arthroplasty, left 04/21/2023   Status post total right knee replacement 03/25/2022   Adenopathy 03/24/2022   Allergic cough 03/24/2022   Allergic rhinitis 03/24/2022   Disease of urinary tract 03/24/2022   Feeling stressed out 03/24/2022   Syncope and collapse 03/24/2022   Palpitations 03/24/2022   Renal stone 01/31/2022   Prediabetes 09/29/2021   Primary osteoarthritis of left knee 09/29/2021   Chronic systolic CHF (congestive heart failure) (HCC) 10/18/2016   Cardiomyopathy, dilated (HCC) 08/19/2016   Vitamin D  deficiency 06/15/2015   Chest pain 11/20/2010   LBBB (left bundle branch block) 11/20/2010    Hyperlipidemia 11/20/2010   Acute antritis 09/26/2008   Acute bronchitis 06/10/2007   PCP:  Fernande Ophelia JINNY DOUGLAS, MD Pharmacy:   [id number] - [place],  - [address] [place] [address] Phone: [phone] Fax: [phone]     Social Drivers of Health (SDOH) Social History: SDOH Screenings   Food Insecurity: No Food Insecurity (04/21/2023)  Housing: Low Risk  (04/21/2023)  Transportation Needs: No Transportation Needs (04/21/2023)  Utilities: Not At Risk (04/21/2023)  Financial Resource Strain: Low Risk  (01/14/2023)   Received from [facility]  Tobacco Use: Low Risk  (04/21/2023)   SDOH Interventions:     Readmission Risk Interventions     No data to display

## 2023-04-22 NOTE — Anesthesia Postprocedure Evaluation (Signed)
 Anesthesia Post Note  Patient: Erin Stuart  Procedure(s) Performed: COMPUTER ASSISTED TOTAL KNEE ARTHROPLASTY (Left: Knee)  Patient location during evaluation: Nursing Unit Anesthesia Type: Spinal Level of consciousness: awake Pain management: pain level controlled Respiratory status: spontaneous breathing Cardiovascular status: stable Postop Assessment: no headache Anesthetic complications: no Comments: Pt states good motor and sensory return. On bed rest currently for lightheadedness during ambulation.  Hospitalist following.     There were no known notable events for this encounter.   Last Vitals:  Vitals:   04/21/23 2342 04/22/23 0526  BP: 99/63 104/66  Pulse: 74 95  Resp: 16 18  Temp: 36.4 C (!) 36.2 C  SpO2: 95% 94%    Last Pain:  Vitals:   04/22/23 0617  TempSrc:   PainSc: 3                  Shona Earnie Fare

## 2023-04-22 NOTE — Plan of Care (Signed)
  Problem: Activity: Goal: Range of joint motion will improve Outcome: Progressing   Problem: Pain Management: Goal: Pain level will decrease with appropriate interventions Outcome: Progressing

## 2023-08-14 ENCOUNTER — Other Ambulatory Visit: Payer: Self-pay | Admitting: Internal Medicine

## 2023-08-14 DIAGNOSIS — R928 Other abnormal and inconclusive findings on diagnostic imaging of breast: Secondary | ICD-10-CM

## 2023-08-20 ENCOUNTER — Telehealth: Payer: Self-pay

## 2023-08-20 ENCOUNTER — Other Ambulatory Visit: Payer: Self-pay

## 2023-08-20 DIAGNOSIS — Z860100 Personal history of colon polyps, unspecified: Secondary | ICD-10-CM

## 2023-08-20 MED ORDER — NA SULFATE-K SULFATE-MG SULF 17.5-3.13-1.6 GM/177ML PO SOLN: 1.0000 | Freq: Once | ORAL | 0 refills | Status: AC

## 2023-08-20 NOTE — Telephone Encounter (Signed)
 Pt requesting call back to schedule colonoscopy.

## 2023-08-20 NOTE — Telephone Encounter (Signed)
 Gastroenterology Pre-Procedure Review  Request Date: 10/02/23 Requesting Physician: Dr. Ole Berkeley  PATIENT REVIEW QUESTIONS: The patient responded to the following health history questions as indicated:    1. Are you having any GI issues? no 2. Do you have a personal history of Polyps? yes (last colonoscopy performed by Dr. Antony Baumgartner 06/16/20 recommended repeat in 3 years) 3. Do you have a family history of Colon Cancer or Polyps? Family history of ulcerative colitis had to have colon resection 4. Diabetes Mellitus? no 5. Joint replacements in the past 12 months?yes left knee replacement in May 29 2023 6. Major health problems in the past 3 months?no 7. Any artificial heart valves, MVP, or defibrillator?No devices however heart condition.  Cardiac clearance sent to Dr. Darral Ellis office    MEDICATIONS & ALLERGIES:    Patient reports the following regarding taking any anticoagulation/antiplatelet therapy:   Plavix, Coumadin, Eliquis, Xarelto, Lovenox , Pradaxa, Brilinta, or Effient? no Aspirin ? yes (81 mg daily)  Patient confirms/reports the following medications:  Current Outpatient Medications  Medication Sig Dispense Refill   ALPRAZolam  (XANAX ) 1 MG tablet Take 1 tablet (1 mg total) by mouth 3 (three) times daily as needed for anxiety. (Patient taking differently: Take 1 mg by mouth daily as needed for anxiety.) 60 tablet 2   amoxicillin (AMOXIL) 500 MG capsule Take 2,000 mg by mouth See admin instructions. Take 2000 mg 1 hour prior to dental work     Ascorbic Acid (VITAMIN C) 1000 MG tablet Take 1,000 mg by mouth daily.     aspirin  81 MG tablet Take 1 tablet (81 mg total) by mouth in the morning and at bedtime. Reported on 03/02/2015     carvedilol  (COREG ) 6.25 MG tablet Take 6.25 mg by mouth 2 (two) times daily.     celecoxib  (CELEBREX ) 200 MG capsule Take 1 capsule (200 mg total) by mouth 2 (two) times daily. 60 capsule 1   Cranberry-Vitamin C (AZO CRANBERRY URINARY TRACT PO) Take 2 tablets  by mouth daily.     Cyanocobalamin  (B-12) 2500 MCG TABS Take 2,500 mcg by mouth daily.     furosemide  (LASIX ) 20 MG tablet Take 20 mg by mouth daily.     ibuprofen (ADVIL) 200 MG tablet Take 800 mg by mouth daily as needed for moderate pain (pain score 4-6).     ipratropium (ATROVENT ) 0.03 % nasal spray Place 2 sprays into both nostrils 3 (three) times daily as needed.     losartan  (COZAAR ) 25 MG tablet Take 25 mg by mouth at bedtime.     Magnesium  250 MG TABS Take 1 tablet by mouth daily.     oxyCODONE  (OXY IR/ROXICODONE ) 5 MG immediate release tablet Take 1 tablet (5 mg total) by mouth every 4 (four) hours as needed for moderate pain (pain score 4-6) (pain score 4-6). 30 tablet 0   oxymetazoline (MUCINEX SINUS-MAX SINUS/ALLRGY) 0.05 % nasal spray Place 1 spray into both nostrils daily as needed for congestion.     rosuvastatin  (CRESTOR ) 5 MG tablet Take 5 mg by mouth at bedtime.     senna-docusate (SENOKOT-S) 8.6-50 MG tablet Take 1 tablet by mouth daily as needed for mild constipation.     spironolactone  (ALDACTONE ) 25 MG tablet Take 25 mg by mouth daily.     traMADol  (ULTRAM ) 50 MG tablet Take 1-2 tablets (50-100 mg total) by mouth every 4 (four) hours as needed for moderate pain (pain score 4-6). 30 tablet 0   venlafaxine  XR (EFFEXOR -XR) 75 MG 24 hr capsule Take  75 mg by mouth at bedtime.     Vitamin D , Ergocalciferol , (DRISDOL ) 1.25 MG (50000 UNIT) CAPS capsule Take 50,000 Units by mouth every 7 (seven) days.     Vitamin D -Vitamin K (D3 + K2 PO) Take 1 capsule by mouth daily.     No current facility-administered medications for this visit.    Patient confirms/reports the following allergies:  Allergies  Allergen Reactions   Sulfa  Antibiotics Nausea And Vomiting    Pt states it felt like she had the flu.   Bactrim  [Sulfamethoxazole -Trimethoprim ] Nausea And Vomiting    No orders of the defined types were placed in this encounter.   AUTHORIZATION INFORMATION Primary  Insurance: 1D#: Group #:  Secondary Insurance: 1D#: Group #:  SCHEDULE INFORMATION: Date: 10/02/23 Time: Location: [facility]

## 2023-08-28 ENCOUNTER — Telehealth: Payer: Self-pay

## 2023-08-28 NOTE — Telephone Encounter (Signed)
 Received cardiac clearance-patient is cleared to have procedure.

## 2023-09-01 NOTE — Telephone Encounter (Signed)
 08/28/23  3:18 PM Note Received cardiac clearance-patient is cleared to have procedure.      Cardiac clearance granted from Dr. Parks Bollman.  Thanks,  [place], [place]

## 2023-09-15 ENCOUNTER — Ambulatory Visit
Admission: RE | Admit: 2023-09-15 | Discharge: 2023-09-15 | Disposition: A | Discharge status: Home / Self Care | Source: Ambulatory Visit | Attending: Internal Medicine | Admitting: Internal Medicine

## 2023-09-15 DIAGNOSIS — R928 Other abnormal and inconclusive findings on diagnostic imaging of breast: Secondary | ICD-10-CM | POA: Insufficient documentation

## 2023-09-15 DIAGNOSIS — Z1231 Encounter for screening mammogram for malignant neoplasm of breast: Secondary | ICD-10-CM | POA: Insufficient documentation

## 2023-09-24 ENCOUNTER — Telehealth: Payer: Self-pay

## 2023-09-24 NOTE — Telephone Encounter (Signed)
 Pt would like to follow Dr Therisa, so she canceled procedure with Dr Jinny

## 2023-10-02 ENCOUNTER — Ambulatory Visit: Admit: 2023-10-02 | Admitting: Gastroenterology

## 2023-10-02 SURGERY — COLONOSCOPY
Anesthesia: General

## 2024-02-19 ENCOUNTER — Encounter: Payer: Self-pay | Admitting: Gastroenterology

## 2024-02-20 ENCOUNTER — Ambulatory Visit

## 2024-02-20 ENCOUNTER — Ambulatory Visit
Admission: RE | Admit: 2024-02-20 | Discharge: 2024-02-20 | Disposition: A | Attending: Gastroenterology | Admitting: Gastroenterology

## 2024-02-20 ENCOUNTER — Encounter: Payer: Self-pay | Admitting: Gastroenterology

## 2024-02-20 ENCOUNTER — Encounter: Admission: RE | Disposition: A | Payer: Self-pay | Source: Home / Self Care | Attending: Gastroenterology

## 2024-02-20 HISTORY — PX: COLONOSCOPY: SHX5424

## 2024-02-20 HISTORY — PX: POLYPECTOMY: SHX149

## 2024-02-20 SURGERY — COLONOSCOPY
Anesthesia: General

## 2024-02-20 MED ORDER — PROPOFOL 1000 MG/100ML IV EMUL
INTRAVENOUS | Status: AC
  Filled 2024-02-20: qty 100

## 2024-02-20 MED ORDER — LIDOCAINE HCL (PF) 2 % IJ SOLN
INTRAMUSCULAR | Status: AC
  Filled 2024-02-20: qty 5

## 2024-02-20 MED ORDER — PHENYLEPHRINE 80 MCG/ML (10ML) SYRINGE FOR IV PUSH (FOR BLOOD PRESSURE SUPPORT)
PREFILLED_SYRINGE | INTRAVENOUS | Status: AC
  Filled 2024-02-20: qty 10

## 2024-02-20 MED ORDER — GLYCOPYRROLATE 0.2 MG/ML IJ SOLN
INTRAMUSCULAR | Status: AC
  Filled 2024-02-20: qty 1

## 2024-02-20 MED ORDER — PROPOFOL 500 MG/50ML IV EMUL
INTRAVENOUS | Status: DC | PRN
  Administered 2024-02-20: 100 mg via INTRAVENOUS
  Administered 2024-02-20: 140 ug/kg/min via INTRAVENOUS

## 2024-02-20 MED ORDER — LIDOCAINE HCL (CARDIAC) PF 100 MG/5ML IV SOSY
PREFILLED_SYRINGE | INTRAVENOUS | Status: DC | PRN
  Administered 2024-02-20: 20 mg via INTRAVENOUS

## 2024-02-20 MED ORDER — EPHEDRINE 5 MG/ML INJ
INTRAVENOUS | Status: AC
  Filled 2024-02-20: qty 5

## 2024-02-20 MED ORDER — SODIUM CHLORIDE 0.9 % IV SOLN: INTRAVENOUS | Status: DC

## 2024-02-20 NOTE — Anesthesia Postprocedure Evaluation (Signed)
 Anesthesia Post Note  Patient: Erin Stuart  Procedure(s) Performed: COLONOSCOPY POLYPECTOMY, INTESTINE  Patient location during evaluation: Endoscopy Anesthesia Type: General Level of consciousness: awake and alert Pain management: pain level controlled Vital Signs Assessment: post-procedure vital signs reviewed and stable Respiratory status: spontaneous breathing, nonlabored ventilation, respiratory function stable and patient connected to nasal cannula oxygen Cardiovascular status: blood pressure returned to baseline and stable Postop Assessment: no apparent nausea or vomiting Anesthetic complications: no   No notable events documented.   Last Vitals:  Vitals:   02/20/24 0821 02/20/24 0831  BP: 94/76 112/82  Pulse: 60 60  Resp: 14 15  Temp:    SpO2: 97% 98%    Last Pain:  Vitals:   02/20/24 0831  TempSrc:   PainSc: 0-No pain                 Prentice Murphy

## 2024-02-20 NOTE — Anesthesia Preprocedure Evaluation (Signed)
 Anesthesia Evaluation  Patient identified by MRN, date of birth, ID band Patient awake    Reviewed: Allergy & Precautions, NPO status , Patient's Chart, lab work & pertinent test results  History of Anesthesia Complications (+) PONV and history of anesthetic complications  Airway Mallampati: III  TM Distance: <3 FB Neck ROM: full    Dental  (+) Chipped, Dental Advidsory Given, Caps   Pulmonary neg shortness of breath, asthma , neg sleep apnea, neg recent URI   Pulmonary exam normal        Cardiovascular Exercise Tolerance: Good hypertension, (-) angina +CHF  (-) Past MI and (-) Cardiac Stents Normal cardiovascular exam+ dysrhythmias (LBBB) + pacemaker + Cardiac Defibrillator (-) Valvular Problems/Murmurs     Neuro/Psych  PSYCHIATRIC DISORDERS Anxiety Depression    negative neurological ROS     GI/Hepatic negative GI ROS, Neg liver ROS,neg GERD  ,,  Endo/Other  negative endocrine ROS    Renal/GU Renal disease     Musculoskeletal   Abdominal   Peds  Hematology negative hematology ROS (+)   Anesthesia Other Findings Patient has cardiac clearance for this procedure.   Past Medical History: 05/22/2021: Acquired dilation of ascending aorta and aortic root (HCC)     Comment:  a.) TTE 05/22/2021: Ao root 3.8 cm; asc Ao 3.5 cm; b.)               TTE 03/17/2023: Ao root 3.4 cm, asc Ao 3.6 cm No date: Anginal pain (HCC) No date: Anxiety     Comment:  a.) on BZO (alprazolam ) PRN No date: Asthma No date: CHF (congestive heart failure) (HCC) No date: Degenerative arthritis of knee, bilateral No date: Depression No date: Dilated cardiomyopathy (HCC)     Comment:  a.) R/LHC 09/02/2016: EF <25%; b.) TTE 08/13/2016: EF               <15%; c.) MPI 08/14/2016: EF 21%; d.) cMRI 09/12/2016: EF              22%, no LGE; e.) s/p CRT-D placement 11/27/2016; f.) TTE               01/09/2017: EF 25%; g.) TTE 09/11/2017: EF 25-30%;  h.)               TTE 10/09/2018: EF 35%; i.) TTE 09/10/2019: EF 45%; j.)               TTE 08/04/2020: EF 45%; k.) TTE 05/22/2021: EF 45%; l.)               TTE 03/17/2023: EF 50% No date: Diverticulosis No date: Essential hypertension No date: History of kidney stones No date: Hyperlipidemia No date: Hyperparathyroidism (HCC) 2012: LBBB (left bundle branch block) No date: Long-term use of aspirin  therapy 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 [employer] device 08/13/2016: Pulmonary HTN (HCC)     Comment:  a.) TTE 08/13/2017: RVSP 41.2; b.) TTE 01/09/2017: RVSP               36.9; c.) TTE 09/11/2017: RVSP 30.4; d.) 10/09/2018: RVSP              24.3 mmHg; e.) TTE 09/10/2019: RVSP 25.1  mmHg; f.) TTE               03/10/2022: RVSP 19.6; g.) TTE 03/17/2023: RVSP 24 mmHg No date: Vitamin D  deficiency  Past Surgical History: 11/27/2016: BI-VENTRICULAR IMPLANTABLE CARDIOVERTER DEFIBRILLATOR   (CRT-D); N/A     Comment:  Procedure: BI-VENTRICULAR IMPLANTABLE CARDIOVERTER               DEFIBRILLATOR  (CRT-D); Location: [facility]; Surgeon: Franky Ned, MD 06/16/2020: COLONOSCOPY WITH PROPOFOL ; N/A     Comment:  Procedure: COLONOSCOPY WITH PROPOFOL ;  Surgeon: Therisa Bi, MD;  Location: [facility] ENDOSCOPY;  Service:               Gastroenterology;  Laterality: N/A; 01/01/2022: CYSTOSCOPY/URETEROSCOPY/HOLMIUM LASER/STENT PLACEMENT;  Left     Comment:  Procedure: CYSTOSCOPY/URETEROSCOPY/HOLMIUM LASER/STENT               PLACEMENT;  Surgeon: Twylla Glendia BROCKS, MD;  Location:               [facility] ORS;  Service: Urology;  Laterality: Left; 11/29/2021: EXTRACORPOREAL SHOCK WAVE  LITHOTRIPSY; Left     Comment:  procedure not done 03/25/2022: KNEE ARTHROPLASTY; Right     Comment:  Procedure: COMPUTER ASSISTED TOTAL KNEE ARTHROPLASTY -               RNFA;  Surgeon: Mardee Lynwood SQUIBB, MD;  Location: [facility] ORS;              Service: Orthopedics;  Laterality: Right; 09/16/2022: PARATHYROIDECTOMY; Right 08/27/2016: RIGHT/LEFT HEART CATH AND CORONARY ANGIOGRAPHY; Bilateral     Comment:  Procedure: Right/Left Heart Cath and Coronary               Angiography;  Surgeon: Bosie Vinie LABOR, MD;  Location:               [facility] INVASIVE CV LAB;  Service: Cardiovascular;                Laterality: Bilateral; No date: WISDOM TOOTH EXTRACTION  BMI    Body Mass Index: 36.90 kg/m      Reproductive/Obstetrics negative OB ROS                              Anesthesia Physical Anesthesia Plan  ASA: 3  Anesthesia Plan: General   Post-op Pain Management:    Induction: Intravenous  PONV Risk Score and Plan: 4 or greater and Propofol  infusion, TIVA and Treatment may vary due to age or medical condition  Airway Management Planned: Natural Airway and Nasal Cannula  Additional Equipment:   Intra-op Plan:   Post-operative Plan:   Informed Consent: I have reviewed the patients History and Physical, chart, labs and discussed the procedure including the risks, benefits and alternatives for the proposed anesthesia with the patient or authorized representative who has indicated his/her understanding and acceptance.     Dental Advisory Given  Plan Discussed with: Anesthesiologist, CRNA and Surgeon  Anesthesia Plan Comments: (Patient reports no bleeding problems and no anticoagulant use.  Plan for spinal with backup GA  Patient consented for risks of anesthesia including but not limited to:  - adverse reactions to medications - damage to eyes, teeth, lips or other oral mucosa - nerve damage due to positioning  - risk of  bleeding, infection and or nerve  damage from spinal that could lead to paralysis - risk of headache or failed spinal - damage to teeth, lips or other oral mucosa - sore throat or hoarseness - damage to heart, brain, nerves, lungs, other parts of body or loss of life  Patient voiced understanding and assent.)        Anesthesia Quick Evaluation

## 2024-02-20 NOTE — Transfer of Care (Signed)
 Immediate Anesthesia Transfer of Care Note  Patient: Erin Stuart  Procedure(s) Performed: COLONOSCOPY POLYPECTOMY, INTESTINE  Patient Location: PACU and Endoscopy Unit  Anesthesia Type:General  Level of Consciousness: sedated  Airway & Oxygen Therapy: Patient Spontanous Breathing  Post-op Assessment: Report given to RN and Post -op Vital signs reviewed and stable  Post vital signs: Reviewed and stable  Last Vitals:  Vitals Value Taken Time  BP 99/65 02/20/24 08:11  Temp 35.7 C 02/20/24 08:11  Pulse 59 02/20/24 08:11  Resp 16 02/20/24 08:11  SpO2 93 % 02/20/24 08:11  Vitals shown include unfiled device data.  Last Pain:  Vitals:   02/20/24 0811  TempSrc: Temporal  PainSc: Asleep         Complications: No notable events documented.

## 2024-02-20 NOTE — Op Note (Signed)
 [facility] Gastroenterology Patient Name: Erin Stuart Procedure Date: 02/20/2024 7:18 AM MRN: [id number] Account #: [id number] Date of Birth: 02/12/1966 Admit Type: Outpatient Age: 58 Room: [facility] ENDO ROOM 4 Gender: Female Note Status: Finalized Instrument Name: Colon Scope [id number] Procedure:             Colonoscopy Indications:           Surveillance: Personal history of adenomatous polyps                         on last colonoscopy 3 years ago, Last colonoscopy:                         April 2022 Providers:             Ruel Kung MD, MD Referring MD:          Ophelia Sage, MD (Referring MD) Medicines:             Monitored Anesthesia Care Complications:         No immediate complications. Procedure:             Pre-Anesthesia Assessment:                        - Prior to the procedure, a History and Physical was                         performed, and patient medications, allergies and                         sensitivities were reviewed. The patient's tolerance                         of previous anesthesia was reviewed.                        - The risks and benefits of the procedure and the                         sedation options and risks were discussed with the                         patient. All questions were answered and informed                         consent was obtained.                        - ASA Grade Assessment: II - A patient with mild                         systemic disease.                        After obtaining informed consent, the colonoscope was                         passed under direct vision. Throughout the procedure,                         the patient's blood  pressure, pulse, and oxygen                         saturations were monitored continuously. The                         Colonoscope was introduced through the anus and                         advanced to the the cecum, identified by the                         appendiceal  orifice. The colonoscopy was performed                         with ease. The patient tolerated the procedure well.                         The quality of the bowel preparation was excellent.                         The ileocecal valve, appendiceal orifice, and rectum                         were photographed. Findings:      The perianal and digital rectal examinations were normal.      A 3 mm polyp was found in the cecum. The polyp was sessile. The polyp       was removed with a cold biopsy forceps. Resection and retrieval were       complete.      Multiple small-mouthed diverticula were found in the entire colon.      The exam was otherwise without abnormality on direct and retroflexion       views. Impression:            - One 3 mm polyp in the cecum, removed with a cold                         biopsy forceps. Resected and retrieved.                        - Diverticulosis in the entire examined colon.                        - The examination was otherwise normal on direct and                         retroflexion views. Recommendation:        - Discharge patient to home (with escort).                        - Resume previous diet.                        - Continue present medications.                        - Await pathology results.                        -  Repeat colonoscopy in 5 years for surveillance. Procedure Code(s):     --- Professional ---                        [phone], Colonoscopy, flexible; with biopsy, single or                         multiple Diagnosis Code(s):     --- Professional ---                        Z86.010, Personal history of colonic polyps                        D12.0, Benign neoplasm of cecum                        K57.30, Diverticulosis of large intestine without                         perforation or abscess without bleeding CPT copyright 2022 [employer]. All rights reserved. The codes documented in this report are preliminary and upon  coder review may  be revised to meet current compliance requirements. Ruel Kung, MD Ruel Kung MD, MD 02/20/2024 8:08:43 AM This report has been signed electronically. Number of Addenda: 0 Note Initiated On: 02/20/2024 7:18 AM Scope Withdrawal Time: 0 hours 8 minutes 46 seconds  Total Procedure Duration: 0 hours 14 minutes 57 seconds  Estimated Blood Loss:  Estimated blood loss: none.      [facility]

## 2024-02-20 NOTE — H&P (Signed)
 Ruel Kung , MD [address], [address], [place], [place], [address] Phone: [phone] Fax: [phone]  Primary Care Physician:  Fernande Ophelia JINNY DOUGLAS, MD   Pre-Procedure History & Physical: HPI:  Erin Stuart is a 58 y.o. female is here for an colonoscopy.   Past Medical History:  Diagnosis Date   Acquired dilation of ascending aorta and aortic root 05/22/2021   a.) TTE 05/22/2021: Ao root 3.8 cm; asc Ao 3.5 cm; b.) TTE 03/17/2023: Ao root 3.4 cm, asc Ao 3.6 cm   Anginal pain    Anxiety    a.) on BZO (alprazolam ) PRN   Asthma    CHF (congestive heart failure) (HCC)    Degenerative arthritis of knee, bilateral    Depression    Dilated cardiomyopathy (HCC)    a.) R/LHC 09/02/2016: EF <25%; b.) TTE 08/13/2016: EF <15%; c.) MPI 08/14/2016: EF 21%; d.) cMRI 09/12/2016: EF 22%, no LGE; e.) s/p CRT-D placement 11/27/2016; f.) TTE 01/09/2017: EF 25%; g.) TTE 09/11/2017: EF 25-30%; h.) TTE 10/09/2018: EF 35%; i.) TTE 09/10/2019: EF 45%; j.) TTE 08/04/2020: EF 45%; k.) TTE 05/22/2021: EF 45%; l.) TTE 03/17/2023: EF 50%   Diverticulosis    Essential hypertension    History of kidney stones    Hyperlipidemia    Hyperparathyroidism    LBBB (left bundle branch block) 2012   Long-term use of aspirin  therapy    Myocarditis, viral    Palpitations    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 Medtronic device   Presence of permanent cardiac pacemaker    Pulmonary HTN (HCC) 08/13/2016   a.) TTE 08/13/2017: RVSP 41.2; b.) TTE 01/09/2017: RVSP 36.9; c.) TTE 09/11/2017: RVSP 30.4; d.) 10/09/2018: RVSP 24.3 mmHg; e.) TTE 09/10/2019: RVSP 25.1 mmHg; f.) TTE 03/10/2022: RVSP 19.6; g.) TTE 03/17/2023: RVSP 24 mmHg   Vitamin D  deficiency     Past Surgical History:  Procedure Laterality Date    BI-VENTRICULAR IMPLANTABLE CARDIOVERTER DEFIBRILLATOR  (CRT-D) N/A 11/27/2016   Procedure: BI-VENTRICULAR IMPLANTABLE CARDIOVERTER DEFIBRILLATOR  (CRT-D); Location: [facility]; Surgeon: Franky Ned, MD   COLONOSCOPY WITH PROPOFOL  N/A 06/16/2020   Procedure: COLONOSCOPY WITH PROPOFOL ;  Surgeon: Kung Ruel, MD;  Location: [facility] ENDOSCOPY;  Service: Gastroenterology;  Laterality: N/A;   CYSTOSCOPY/URETEROSCOPY/HOLMIUM LASER/STENT PLACEMENT Left 01/01/2022   Procedure: CYSTOSCOPY/URETEROSCOPY/HOLMIUM LASER/STENT PLACEMENT;  Surgeon: Twylla Glendia BROCKS, MD;  Location: [facility] ORS;  Service: Urology;  Laterality: Left;   EXTRACORPOREAL SHOCK WAVE LITHOTRIPSY Left 11/29/2021   procedure not done   KNEE ARTHROPLASTY Right 03/25/2022   Procedure: COMPUTER ASSISTED TOTAL KNEE ARTHROPLASTY - RNFA;  Surgeon: Mardee Lynwood SHAUNNA, MD;  Location: [facility] ORS;  Service: Orthopedics;  Laterality: Right;   KNEE ARTHROPLASTY Left 04/21/2023   Procedure: COMPUTER ASSISTED TOTAL KNEE ARTHROPLASTY;  Surgeon: Mardee Lynwood SHAUNNA, MD;  Location: [facility] ORS;  Service: Orthopedics;  Laterality: Left;   PARATHYROIDECTOMY Right 09/16/2022   RIGHT/LEFT HEART CATH AND CORONARY ANGIOGRAPHY Bilateral 08/27/2016   Procedure: Right/Left Heart Cath and Coronary Angiography;  Surgeon: Bosie Vinie LABOR, MD;  Location: [facility] IN[facility]ASIVE [facility];  Service: Cardiovascular;  Laterality: Bilateral;   WISDOM TOOTH EXTRACTION      Prior to Admission medications   Medication Sig Start Date End Date Taking? Authorizing Provider  carvedilol  (COREG ) 6.25 MG tablet Take 6.25 mg by  mouth 2 (two) times daily. 04/28/20  Yes [provider]  losartan  (COZAAR ) 25 MG tablet Take 25 mg by mouth at bedtime. 04/28/20  Yes [provider]  rosuvastatin  (CRESTOR ) 5 MG tablet Take 5 mg by mouth at bedtime. 05/17/20  Yes [provider]  spironolactone  (ALDACTONE ) 25 MG tablet Take 25 mg by mouth daily. 05/16/20  Yes [provider]  venlafaxine  XR  (EFFEXOR -XR) 75 MG 24 hr capsule Take 75 mg by mouth at bedtime.   Yes [provider]  ALPRAZolam  (XANAX ) 1 MG tablet Take 1 tablet (1 mg total) by mouth 3 (three) times daily as needed for anxiety. Patient taking differently: Take 1 mg by mouth daily as needed for anxiety. 02/18/17   Shambley, Melody N, CNM  amoxicillin (AMOXIL) 500 MG capsule Take 2,000 mg by mouth See admin instructions. Take 2000 mg 1 hour prior to dental work    [provider]  Ascorbic Acid (VITAMIN C) 1000 MG tablet Take 1,000 mg by mouth daily.    [provider]  aspirin  81 MG tablet Take 1 tablet (81 mg total) by mouth in the morning and at bedtime. Reported on 03/02/2015 04/22/23   Drake Chew, PA-C  celecoxib  (CELEBREX ) 200 MG capsule Take 1 capsule (200 mg total) by mouth 2 (two) times daily. 04/22/23   Drake Chew, PA-C  Cranberry-Vitamin C (AZO CRANBERRY URINARY TRACT PO) Take 2 tablets by mouth daily.    [provider]  Cyanocobalamin  (B-12) 2500 MCG TABS Take 2,500 mcg by mouth daily.    [provider]  furosemide  (LASIX ) 20 MG tablet Take 20 mg by mouth daily.    [provider]  ibuprofen (ADVIL) 200 MG tablet Take 800 mg by mouth daily as needed for moderate pain (pain score 4-6).    [provider]  ipratropium (ATROVENT ) 0.03 % nasal spray Place 2 sprays into both nostrils 3 (three) times daily as needed.    [provider]  Magnesium  250 MG TABS Take 1 tablet by mouth daily.    [provider]  oxyCODONE  (OXY IR/ROXICODONE ) 5 MG immediate release tablet Take 1 tablet (5 mg total) by mouth every 4 (four) hours as needed for moderate pain (pain score 4-6) (pain score 4-6). 04/22/23   Drake Chew, PA-C  oxymetazoline (MUCINEX SINUS-MAX SINUS/ALLRGY) 0.05 % nasal spray Place 1 spray into both nostrils daily as needed for congestion.    [provider]  senna-docusate (SENOKOT-S) 8.6-50 MG tablet Take 1 tablet by  mouth daily as needed for mild constipation.    [provider]  traMADol  (ULTRAM ) 50 MG tablet Take 1-2 tablets (50-100 mg total) by mouth every 4 (four) hours as needed for moderate pain (pain score 4-6). 04/22/23   Drake Chew, PA-C  Vitamin D , Ergocalciferol , (DRISDOL ) 1.25 MG (50000 UNIT) CAPS capsule Take 50,000 Units by mouth every 7 (seven) days.    [provider]  Vitamin D -Vitamin K (D3 + K2 PO) Take 1 capsule by mouth daily.    [provider]    Allergies as of 02/02/2024 - Review Complete 08/20/2023  Allergen Reaction Noted   Sulfa  antibiotics Nausea And Vomiting 11/29/2021   Bactrim  [sulfamethoxazole -trimethoprim ] Nausea And Vomiting 11/23/2021    Family History  Problem Relation Age of Onset   Factor V Leiden deficiency Father    Melanoma Father    Cancer Paternal Aunt        ovairan   Breast cancer Neg Hx  Social History   Socioeconomic History   Marital status: Married    Spouse name: Gladis   Number of children: 3   Years of education: Not on file   Highest education level: Not on file  Occupational History   Occupation: [employer]    Employer: [employer]  Tobacco Use   Smoking status: Never   Smokeless tobacco: Never  Vaping Use   Vaping status: Never Used  Substance and Sexual Activity   Alcohol use: No   Drug use: No   Sexual activity: Yes  Other Topics Concern   Not on file  Social History Narrative   Not on file   Social Drivers of Health   Financial Resource Strain: Low Risk  (12/09/2023)   Received from [facility]   Overall Financial Resource Strain (CARDIA)    Difficulty of Paying Living Expenses: Not hard at all  Food Insecurity: No Food Insecurity (12/09/2023)   Received from [facility]   Hunger Vital Sign    Within the past 12 months, you worried that your food would run out before you got the money to buy more.: Never  true    Within the past 12 months, the food you bought just didn't last and you didn't have money to get more.: Never true  Transportation Needs: No Transportation Needs (12/09/2023)   Received [facility] - Transportation    In the past 12 months, has lack of transportation kept you from medical appointments or from getting medications?: No    Lack of Transportation (Non-Medical): No  Physical Activity: Not on file  Stress: Not on file  Social Connections: Not on file  Intimate Partner Violence: Not At Risk (04/21/2023)   Humiliation, Afraid, Rape, and Kick questionnaire    Fear of Current or Ex-Partner: No    Emotionally Abused: No    Physically Abused: No    Sexually Abused: No    Review of Systems: See HPI, otherwise negative ROS  Physical Exam: BP 110/77   Pulse 62   Temp (!) 96.8 F (36 C) (Temporal)   Resp 18   Ht 5' 3 (1.6 m)   Wt 83.5 kg   LMP 05/27/2016   SpO2 96%   BMI 32.59 kg/m  General:   Alert,  pleasant and cooperative in NAD Head:  Normocephalic and atraumatic. Neck:  Supple; no masses or thyromegaly. Lungs:  Clear throughout to auscultation, normal respiratory effort.    Heart:  +S1, +S2, Regular rate and rhythm, No edema. Abdomen:  Soft, nontender and nondistended. Normal bowel sounds, without guarding, and without rebound.   Neurologic:  Alert and  oriented x4;  grossly normal neurologically.  Impression/Plan: AVACYN KLOOSTERMAN is here for an colonoscopy to be performed for surveillance due to prior history of colon polyps   Risks, benefits, limitations, and alternatives regarding  colonoscopy have been reviewed with the patient.  Questions have been answered.  All parties agreeable.   Ruel Kung, MD  02/20/2024, 7:45 AM

## 2024-02-24 LAB — SURGICAL PATHOLOGY
